# Patient Record
Sex: Female | Born: 1935 | Race: White | Hispanic: No | Marital: Married | Smoking: Former smoker
Health system: Southern US, Community
[De-identification: ages and names within clinical notes are randomized; demographics above are authoritative.]

## PROBLEM LIST (undated history)

## (undated) DIAGNOSIS — M1990 Unspecified osteoarthritis, unspecified site: Secondary | ICD-10-CM

## (undated) DIAGNOSIS — M62838 Other muscle spasm: Secondary | ICD-10-CM

## (undated) DIAGNOSIS — K219 Gastro-esophageal reflux disease without esophagitis: Secondary | ICD-10-CM

## (undated) DIAGNOSIS — M779 Enthesopathy, unspecified: Secondary | ICD-10-CM

## (undated) HISTORY — DX: Enthesopathy, unspecified: M77.9

## (undated) HISTORY — PX: REPLACEMENT TOTAL KNEE: SUR1224

## (undated) HISTORY — PX: BREAST BIOPSY: SHX20

## (undated) HISTORY — PX: BUNIONECTOMY: SHX129

## (undated) HISTORY — PX: OOPHORECTOMY: SHX86

## (undated) HISTORY — PX: FOOT SURGERY: SHX648

## (undated) HISTORY — DX: Other muscle spasm: M62.838

---
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
## 1976-12-24 HISTORY — PX: ABDOMINAL HYSTERECTOMY: SHX81

## 2003-07-23 DIAGNOSIS — M810 Age-related osteoporosis without current pathological fracture: Secondary | ICD-10-CM | POA: Insufficient documentation

## 2004-11-02 ENCOUNTER — Other Ambulatory Visit: Payer: Self-pay

## 2004-11-08 ENCOUNTER — Inpatient Hospital Stay: Payer: Self-pay | Admitting: Specialist

## 2005-01-16 ENCOUNTER — Ambulatory Visit: Payer: Self-pay | Admitting: Family Medicine

## 2005-11-23 ENCOUNTER — Ambulatory Visit: Payer: Self-pay | Admitting: Podiatry

## 2005-12-06 ENCOUNTER — Ambulatory Visit: Payer: Self-pay | Admitting: Podiatry

## 2006-03-14 ENCOUNTER — Ambulatory Visit: Payer: Self-pay | Admitting: Family Medicine

## 2007-01-03 ENCOUNTER — Ambulatory Visit: Payer: Self-pay | Admitting: Family Medicine

## 2007-02-05 ENCOUNTER — Ambulatory Visit: Payer: Self-pay | Admitting: Anesthesiology

## 2007-02-17 ENCOUNTER — Ambulatory Visit: Payer: Self-pay | Admitting: Anesthesiology

## 2007-03-04 ENCOUNTER — Ambulatory Visit: Payer: Self-pay | Admitting: Anesthesiology

## 2007-03-31 ENCOUNTER — Ambulatory Visit: Payer: Self-pay | Admitting: Anesthesiology

## 2007-05-14 ENCOUNTER — Ambulatory Visit: Payer: Self-pay | Admitting: Anesthesiology

## 2007-05-22 ENCOUNTER — Ambulatory Visit: Payer: Self-pay | Admitting: Family Medicine

## 2007-05-29 ENCOUNTER — Ambulatory Visit: Payer: Self-pay | Admitting: Anesthesiology

## 2008-06-03 ENCOUNTER — Ambulatory Visit: Payer: Self-pay | Admitting: Family Medicine

## 2008-06-21 ENCOUNTER — Ambulatory Visit: Payer: Self-pay | Admitting: Gastroenterology

## 2009-04-05 ENCOUNTER — Ambulatory Visit: Payer: Self-pay | Admitting: Podiatry

## 2009-05-20 DIAGNOSIS — M1990 Unspecified osteoarthritis, unspecified site: Secondary | ICD-10-CM | POA: Insufficient documentation

## 2009-07-21 DIAGNOSIS — M48061 Spinal stenosis, lumbar region without neurogenic claudication: Secondary | ICD-10-CM | POA: Insufficient documentation

## 2009-07-21 DIAGNOSIS — Z90710 Acquired absence of both cervix and uterus: Secondary | ICD-10-CM | POA: Insufficient documentation

## 2009-07-21 DIAGNOSIS — M9979 Connective tissue and disc stenosis of intervertebral foramina of abdomen and other regions: Secondary | ICD-10-CM | POA: Insufficient documentation

## 2009-08-10 ENCOUNTER — Ambulatory Visit: Payer: Self-pay | Admitting: Family Medicine

## 2009-09-08 DIAGNOSIS — E7800 Pure hypercholesterolemia, unspecified: Secondary | ICD-10-CM | POA: Insufficient documentation

## 2011-01-24 ENCOUNTER — Ambulatory Visit: Payer: Self-pay | Admitting: Family Medicine

## 2012-03-07 ENCOUNTER — Ambulatory Visit: Payer: Self-pay | Admitting: Family Medicine

## 2012-08-22 ENCOUNTER — Ambulatory Visit: Payer: Self-pay | Admitting: Family Medicine

## 2012-08-28 ENCOUNTER — Ambulatory Visit: Payer: Self-pay | Admitting: General Surgery

## 2012-08-28 ENCOUNTER — Other Ambulatory Visit: Payer: Self-pay | Admitting: General Surgery

## 2012-08-28 LAB — CBC WITH DIFFERENTIAL/PLATELET
Basophil %: 0.4 %
Eosinophil #: 0.1 10*3/uL (ref 0.0–0.7)
Eosinophil %: 0.4 %
Lymphocyte #: 1 10*3/uL (ref 1.0–3.6)
MCV: 93 fL (ref 80–100)
Monocyte #: 0.9 x10 3/mm (ref 0.2–0.9)
Monocyte %: 7.8 %
Neutrophil #: 9.7 10*3/uL — ABNORMAL HIGH (ref 1.4–6.5)
Platelet: 261 10*3/uL (ref 150–440)
RBC: 3.76 10*6/uL — ABNORMAL LOW (ref 3.80–5.20)
WBC: 11.7 10*3/uL — ABNORMAL HIGH (ref 3.6–11.0)

## 2012-08-28 LAB — SEDIMENTATION RATE: Erythrocyte Sed Rate: 29 mm/hr (ref 0–30)

## 2012-08-28 LAB — CREATININE, SERUM: Creatinine: 0.83 mg/dL (ref 0.60–1.30)

## 2012-08-29 ENCOUNTER — Other Ambulatory Visit: Payer: Self-pay | Admitting: Family Medicine

## 2012-08-29 LAB — CBC WITH DIFFERENTIAL/PLATELET
Basophil %: 0.5 %
Eosinophil #: 0.1 10*3/uL (ref 0.0–0.7)
HCT: 35 % (ref 35.0–47.0)
Lymphocyte #: 2.1 10*3/uL (ref 1.0–3.6)
Lymphocyte %: 20.9 %
MCH: 31.6 pg (ref 26.0–34.0)
MCHC: 34.4 g/dL (ref 32.0–36.0)
MCV: 92 fL (ref 80–100)
Monocyte %: 10 %
Neutrophil #: 6.7 10*3/uL — ABNORMAL HIGH (ref 1.4–6.5)
Platelet: 272 10*3/uL (ref 150–440)
RBC: 3.8 10*6/uL (ref 3.80–5.20)
WBC: 9.9 10*3/uL (ref 3.6–11.0)

## 2012-09-01 ENCOUNTER — Ambulatory Visit: Payer: Self-pay | Admitting: Family Medicine

## 2013-03-18 ENCOUNTER — Ambulatory Visit: Payer: Self-pay | Admitting: Family Medicine

## 2013-03-20 ENCOUNTER — Ambulatory Visit: Payer: Self-pay | Admitting: Family Medicine

## 2013-08-12 ENCOUNTER — Encounter: Payer: Self-pay | Admitting: *Deleted

## 2013-09-02 ENCOUNTER — Ambulatory Visit (INDEPENDENT_AMBULATORY_CARE_PROVIDER_SITE_OTHER): Payer: Medicare Other | Admitting: General Surgery

## 2013-09-02 ENCOUNTER — Encounter: Payer: Self-pay | Admitting: General Surgery

## 2013-09-02 ENCOUNTER — Other Ambulatory Visit: Payer: Medicare Other

## 2013-09-02 VITALS — BP 140/76 | HR 74 | Resp 12 | Ht 63.0 in | Wt 147.0 lb

## 2013-09-02 DIAGNOSIS — N644 Mastodynia: Secondary | ICD-10-CM

## 2013-09-02 NOTE — Patient Instructions (Addendum)
Patient to have an left breast mammogram. Patient to use heat twice daily.   Patient to have a unilateral left breast diagnostic mammogram at [facility] on 09-22-13 at 8 am. She is aware of date, time, and instructions.

## 2013-09-02 NOTE — Progress Notes (Signed)
Patient ID: Jacqueline Daniels, female   DOB: August 16, 1936, 77 y.o.   MRN: [id number]  Chief Complaint  Patient presents with  . Other    left breast pain    HPI Jacqueline Daniels is a 77 y.o. female here today for evaluation of left breast pain. She states its been going on for about six months off and on pain. Last mammogram was in March 2014. Patient states she does not feel any lumps just sore.  The patient is very active, and plays golf at least 3 times a week. She had recently gone to [place] to help move her mother into an assisted living facility and close down the house. She had been doing a great deal of activity during this time, as well as on her return home in managing her large flour and vegetable garden. She's not 100% sure that there is a direct correlation between her discomfort and vigorous activity. She has appreciated a focal area of tenderness in the lateral aspect of the left breast. Frequently she symptom free except with direct pressure to this area. The patient was last seen in September 2013 when she was having pain underneath the right arm and in the right flank. Subsequent evaluation at that time included a CT about the abdomen as well as a dedicated CT for pulmonary embolus which was negative. HPI  Past Medical History  Diagnosis Date  . Muscle spasm     Past Surgical History  Procedure Laterality Date  . Appendectomy  1945  . Replacement total knee    . Foot surgery Bilateral   . Abdominal hysterectomy  1978    Family History  Problem Relation Age of Onset  . Breast cancer Mother     Social History History  Substance Use Topics  . Smoking status: Former Smoker -- 1.00 packs/day for 5 years  . Smokeless tobacco: Never Used  . Alcohol Use: No    No Known Allergies  Current Outpatient Prescriptions  Medication Sig Dispense Refill  . fish oil-omega-3 fatty acids 1000 MG capsule Take 2 g by mouth daily.      . meloxicam (MOBIC) 15 MG tablet Take 15 mg  by mouth daily.      . Multiple Vitamins-Minerals (MULTIVITAMIN WITH MINERALS) tablet Take 1 tablet by mouth daily.      Marland Kitchen omeprazole (PRILOSEC) 20 MG capsule Take 20 mg by mouth daily.       No current facility-administered medications for this visit.    Review of Systems Review of Systems  Constitutional: Negative.   Respiratory: Negative.   Cardiovascular: Negative.     Blood pressure 140/76, pulse 74, resp. rate 12, height 5\' 3"  (1.6 m), weight 147 lb (66.679 kg). The patient's weight is down 2 pounds from her September 2013 exam. Physical Exam Physical Exam  Constitutional: She is oriented to person, place, and time. She appears well-developed and well-nourished.  Cardiovascular: Normal rate, regular rhythm and normal heart sounds.   Pulmonary/Chest: Breath sounds normal. Right breast exhibits no inverted nipple, no mass, no nipple discharge, no skin change and no tenderness. Left breast exhibits no inverted nipple, no mass, no nipple discharge, no skin change and no tenderness.  Lymphadenopathy:    She has no cervical adenopathy.    She has no axillary adenopathy.  Neurological: She is alert and oriented to person, place, and time.  Skin: Skin is warm and dry.    Data Reviewed PCP notes. Laboratory studies dated 08/10/2013 showed normal CBC,  slightly elevated creatinine at 1.0 with an estimated GFR 56, normal electrolytes, normal liver function studies, normal TSH.  Bilateral mammograms dated 03/20/2013 were reviewed. Heterogeneously dense tissue appreciated. No definable mass or microcalcifications. BI-RAD-2.  Ultrasound examination of the upper outer quadrant of the left breast in the area of the patient's tendon showed a focal asymmetry at the 2:00 position 8 cm from the nipple. 2 adjacent slightly lobulated hypoechoic areas measuring up to 0.7 cm were identified. These were adjacent to the pectoralis fascia. One of the 2 areas was thought to represent a small lymph node.  No increased vascularity appreciated on duplex imaging.    Assessment     Mastalgia.    Plan     Patient is presently making use of anti-inflammatory. She's been encouraged to make use local heat for comfort. We'll arrange for a repeat mammogram to confirm no interval change.    Patient to have a unilateral left breast diagnostic mammogram at [facility] on 09-22-13 at 8 am. She is aware of date, time, and instructions. (Please note: this date is to accommodate patient's upcoming trip to [place]. She will be leaving this Friday and not returning until the end of the month.)  This patient will follow up in the office in one month.   Earline Mayotte 09/03/2013, 8:23 AM

## 2013-09-03 ENCOUNTER — Encounter: Payer: Self-pay | Admitting: General Surgery

## 2013-10-01 ENCOUNTER — Encounter: Payer: Self-pay | Admitting: General Surgery

## 2013-10-01 ENCOUNTER — Ambulatory Visit: Payer: Self-pay | Admitting: General Surgery

## 2013-10-06 ENCOUNTER — Encounter: Payer: Self-pay | Admitting: General Surgery

## 2013-10-06 ENCOUNTER — Ambulatory Visit (INDEPENDENT_AMBULATORY_CARE_PROVIDER_SITE_OTHER): Payer: Medicare Other | Admitting: General Surgery

## 2013-10-06 VITALS — BP 110/64 | HR 76 | Resp 12 | Ht 63.0 in | Wt 142.0 lb

## 2013-10-06 DIAGNOSIS — N644 Mastodynia: Secondary | ICD-10-CM

## 2013-10-06 NOTE — Patient Instructions (Addendum)
Patient to return as needed. If breast pain gets worse patient to call our office.

## 2013-10-06 NOTE — Progress Notes (Signed)
Patient ID: Jacqueline Daniels, female   DOB: 09/17/1936, 77 y.o.   MRN: [id number]  Chief Complaint  Patient presents with  . Other    mastalgia    HPI Jacqueline Daniels is a 77 y.o. female who presents for a breast evaluation. The most recent left breast mammogram was done on 09/22/13. Patient does perform regular self breast checks and gets regular mammograms done. She still has some discomfort but has improved since her last visit.     HPI  Past Medical History  Diagnosis Date  . Muscle spasm     Past Surgical History  Procedure Laterality Date  . Appendectomy  1945  . Replacement total knee    . Foot surgery Bilateral   . Abdominal hysterectomy  1978    Family History  Problem Relation Age of Onset  . Breast cancer Mother     Social History History  Substance Use Topics  . Smoking status: Former Smoker -- 1.00 packs/day for 5 years  . Smokeless tobacco: Never Used  . Alcohol Use: No    No Known Allergies  Current Outpatient Prescriptions  Medication Sig Dispense Refill  . fish oil-omega-3 fatty acids 1000 MG capsule Take 2 g by mouth daily.      . meloxicam (MOBIC) 15 MG tablet Take 15 mg by mouth daily.      . Multiple Vitamins-Minerals (MULTIVITAMIN WITH MINERALS) tablet Take 1 tablet by mouth daily.      Marland Kitchen omeprazole (PRILOSEC) 20 MG capsule Take 20 mg by mouth daily.       No current facility-administered medications for this visit.    Review of Systems Review of Systems  Constitutional: Negative.   Respiratory: Negative.   Cardiovascular: Negative.     Blood pressure 110/64, pulse 76, resp. rate 12, height 5\' 3"  (1.6 m), weight 142 lb (64.411 kg).  Physical Exam Physical Exam  Constitutional: She is oriented to person, place, and time. She appears well-developed and well-nourished.  Neck: No thyromegaly present.  Cardiovascular: Normal rate, regular rhythm and normal heart sounds.   No murmur heard. Pulmonary/Chest: Effort normal and breath sounds  normal. Right breast exhibits no inverted nipple, no mass, no nipple discharge, no skin change and no tenderness. Left breast exhibits no inverted nipple, no mass, no nipple discharge, no skin change and no tenderness.    Left breast thickening at 2 o'clock.  Lymphadenopathy:    She has no cervical adenopathy.    She has no axillary adenopathy.  Neurological: She is alert and oriented to person, place, and time.  Skin: Skin is warm and dry.    Data Reviewed Left breast mammogram dated 10/01/2013 was reviewed. BI-RAD-1.  Assessment    Benign breast exam, resolving mastalgia    Plan    The patient has shown significant improvement since her last exam. There is a slight area of focal thickening which may represent inflamed adipose tissue. Options for management at this time include: 1) ongoing observation versus 2) vacuum biopsy. Pros and cons of each were reviewed. At this time the patient is comfortable with observation. She'll resume annual screening mammograms in spring 2015 with her primary care provider. She was encouraged to call should she appreciate any change in the breast exam, tenderness or worry.       Earline Mayotte 10/07/2013, 7:05 AM

## 2014-05-19 ENCOUNTER — Ambulatory Visit: Payer: Self-pay | Admitting: Unknown Physician Specialty

## 2014-05-21 LAB — PATHOLOGY REPORT

## 2014-06-07 DIAGNOSIS — K589 Irritable bowel syndrome without diarrhea: Secondary | ICD-10-CM | POA: Insufficient documentation

## 2014-06-07 DIAGNOSIS — K449 Diaphragmatic hernia without obstruction or gangrene: Secondary | ICD-10-CM | POA: Insufficient documentation

## 2014-10-25 ENCOUNTER — Encounter: Payer: Self-pay | Admitting: General Surgery

## 2014-10-25 ENCOUNTER — Ambulatory Visit: Payer: Self-pay | Admitting: Family Medicine

## 2015-03-28 LAB — LIPID PANEL
Cholesterol: 266 mg/dL — AB (ref 0–200)
HDL: 86 mg/dL — AB (ref 35–70)
LDL CALC: 162 mg/dL
LDl/HDL Ratio: 1.9
Triglycerides: 92 mg/dL (ref 40–160)

## 2015-03-28 LAB — CBC AND DIFFERENTIAL
HCT: 39 % (ref 36–46)
Hemoglobin: 13.3 g/dL (ref 12.0–16.0)
Neutrophils Absolute: 5 /uL
PLATELETS: 293 10*3/uL (ref 150–399)
WBC: 7.9 10*3/mL

## 2015-03-28 LAB — HEPATIC FUNCTION PANEL
ALK PHOS: 93 U/L (ref 25–125)
ALT: 16 U/L (ref 7–35)
AST: 24 U/L (ref 13–35)
BILIRUBIN, TOTAL: 0.3 mg/dL

## 2015-03-28 LAB — BASIC METABOLIC PANEL
BUN: 19 mg/dL (ref 4–21)
CREATININE: 0.9 mg/dL (ref 0.5–1.1)
GLUCOSE: 98 mg/dL
Potassium: 4.4 mmol/L (ref 3.4–5.3)
Sodium: 144 mmol/L (ref 137–147)

## 2015-03-28 LAB — TSH: TSH: 1.51 u[IU]/mL (ref 0.41–5.90)

## 2015-04-19 ENCOUNTER — Other Ambulatory Visit: Payer: Self-pay | Admitting: Specialist

## 2015-04-19 DIAGNOSIS — M25512 Pain in left shoulder: Secondary | ICD-10-CM

## 2015-04-28 ENCOUNTER — Ambulatory Visit
Admission: RE | Admit: 2015-04-28 | Discharge: 2015-04-28 | Disposition: A | Discharge status: Home / Self Care | Payer: Medicare Other | Source: Ambulatory Visit | Attending: Specialist | Admitting: Specialist

## 2015-04-28 DIAGNOSIS — M25512 Pain in left shoulder: Secondary | ICD-10-CM

## 2015-04-28 DIAGNOSIS — X58XXXA Exposure to other specified factors, initial encounter: Secondary | ICD-10-CM | POA: Diagnosis not present

## 2015-04-28 DIAGNOSIS — S46912A Strain of unspecified muscle, fascia and tendon at shoulder and upper arm level, left arm, initial encounter: Secondary | ICD-10-CM | POA: Insufficient documentation

## 2015-05-26 ENCOUNTER — Other Ambulatory Visit: Payer: Self-pay | Admitting: Family Medicine

## 2015-09-22 ENCOUNTER — Ambulatory Visit (INDEPENDENT_AMBULATORY_CARE_PROVIDER_SITE_OTHER): Payer: Medicare Other

## 2015-09-22 DIAGNOSIS — Z23 Encounter for immunization: Secondary | ICD-10-CM

## 2015-11-05 ENCOUNTER — Other Ambulatory Visit: Payer: Self-pay | Admitting: Family Medicine

## 2015-11-05 DIAGNOSIS — K219 Gastro-esophageal reflux disease without esophagitis: Secondary | ICD-10-CM

## 2015-11-07 DIAGNOSIS — K219 Gastro-esophageal reflux disease without esophagitis: Secondary | ICD-10-CM | POA: Insufficient documentation

## 2015-11-07 NOTE — Telephone Encounter (Signed)
Last ov 03/16/15

## 2016-01-25 DIAGNOSIS — M9901 Segmental and somatic dysfunction of cervical region: Secondary | ICD-10-CM | POA: Diagnosis not present

## 2016-01-25 DIAGNOSIS — M9903 Segmental and somatic dysfunction of lumbar region: Secondary | ICD-10-CM | POA: Diagnosis not present

## 2016-01-25 DIAGNOSIS — M62830 Muscle spasm of back: Secondary | ICD-10-CM | POA: Diagnosis not present

## 2016-01-25 DIAGNOSIS — M955 Acquired deformity of pelvis: Secondary | ICD-10-CM | POA: Diagnosis not present

## 2016-01-25 DIAGNOSIS — M5136 Other intervertebral disc degeneration, lumbar region: Secondary | ICD-10-CM | POA: Diagnosis not present

## 2016-01-25 DIAGNOSIS — M9905 Segmental and somatic dysfunction of pelvic region: Secondary | ICD-10-CM | POA: Diagnosis not present

## 2016-02-01 DIAGNOSIS — Z6825 Body mass index (BMI) 25.0-25.9, adult: Secondary | ICD-10-CM | POA: Insufficient documentation

## 2016-02-01 DIAGNOSIS — G43009 Migraine without aura, not intractable, without status migrainosus: Secondary | ICD-10-CM | POA: Insufficient documentation

## 2016-02-01 DIAGNOSIS — M9905 Segmental and somatic dysfunction of pelvic region: Secondary | ICD-10-CM | POA: Diagnosis not present

## 2016-02-01 DIAGNOSIS — K649 Unspecified hemorrhoids: Secondary | ICD-10-CM | POA: Insufficient documentation

## 2016-02-01 DIAGNOSIS — K219 Gastro-esophageal reflux disease without esophagitis: Secondary | ICD-10-CM | POA: Insufficient documentation

## 2016-02-01 DIAGNOSIS — F419 Anxiety disorder, unspecified: Secondary | ICD-10-CM | POA: Insufficient documentation

## 2016-02-01 DIAGNOSIS — D649 Anemia, unspecified: Secondary | ICD-10-CM | POA: Insufficient documentation

## 2016-02-01 DIAGNOSIS — M9903 Segmental and somatic dysfunction of lumbar region: Secondary | ICD-10-CM | POA: Diagnosis not present

## 2016-02-01 DIAGNOSIS — M5136 Other intervertebral disc degeneration, lumbar region: Secondary | ICD-10-CM | POA: Diagnosis not present

## 2016-02-01 DIAGNOSIS — R030 Elevated blood-pressure reading, without diagnosis of hypertension: Secondary | ICD-10-CM | POA: Insufficient documentation

## 2016-02-01 DIAGNOSIS — M62830 Muscle spasm of back: Secondary | ICD-10-CM | POA: Diagnosis not present

## 2016-02-01 DIAGNOSIS — M9901 Segmental and somatic dysfunction of cervical region: Secondary | ICD-10-CM | POA: Diagnosis not present

## 2016-02-01 DIAGNOSIS — F418 Other specified anxiety disorders: Secondary | ICD-10-CM | POA: Insufficient documentation

## 2016-02-01 DIAGNOSIS — M955 Acquired deformity of pelvis: Secondary | ICD-10-CM | POA: Diagnosis not present

## 2016-02-01 DIAGNOSIS — L989 Disorder of the skin and subcutaneous tissue, unspecified: Secondary | ICD-10-CM | POA: Insufficient documentation

## 2016-02-01 DIAGNOSIS — K909 Intestinal malabsorption, unspecified: Secondary | ICD-10-CM | POA: Insufficient documentation

## 2016-02-08 DIAGNOSIS — M5136 Other intervertebral disc degeneration, lumbar region: Secondary | ICD-10-CM | POA: Diagnosis not present

## 2016-02-08 DIAGNOSIS — M955 Acquired deformity of pelvis: Secondary | ICD-10-CM | POA: Diagnosis not present

## 2016-02-08 DIAGNOSIS — M62830 Muscle spasm of back: Secondary | ICD-10-CM | POA: Diagnosis not present

## 2016-02-08 DIAGNOSIS — M9903 Segmental and somatic dysfunction of lumbar region: Secondary | ICD-10-CM | POA: Diagnosis not present

## 2016-02-08 DIAGNOSIS — M9905 Segmental and somatic dysfunction of pelvic region: Secondary | ICD-10-CM | POA: Diagnosis not present

## 2016-02-08 DIAGNOSIS — M9901 Segmental and somatic dysfunction of cervical region: Secondary | ICD-10-CM | POA: Diagnosis not present

## 2016-02-27 ENCOUNTER — Encounter: Payer: Self-pay | Admitting: Family Medicine

## 2016-02-27 ENCOUNTER — Ambulatory Visit (INDEPENDENT_AMBULATORY_CARE_PROVIDER_SITE_OTHER): Payer: Medicare Other | Admitting: Family Medicine

## 2016-02-27 VITALS — BP 150/86 | HR 72 | Temp 98.2°F | Resp 16 | Ht 63.0 in | Wt 148.0 lb

## 2016-02-27 DIAGNOSIS — E7800 Pure hypercholesterolemia, unspecified: Secondary | ICD-10-CM

## 2016-02-27 DIAGNOSIS — M542 Cervicalgia: Secondary | ICD-10-CM

## 2016-02-27 DIAGNOSIS — R030 Elevated blood-pressure reading, without diagnosis of hypertension: Secondary | ICD-10-CM

## 2016-02-27 DIAGNOSIS — Z0000 Encounter for general adult medical examination without abnormal findings: Secondary | ICD-10-CM | POA: Diagnosis not present

## 2016-02-27 DIAGNOSIS — Z1239 Encounter for other screening for malignant neoplasm of breast: Secondary | ICD-10-CM

## 2016-02-27 NOTE — Progress Notes (Signed)
Patient ID: Jacqueline Daniels, female   DOB: 01-09-1936, 80 y.o.   MRN: [id number]       Patient: Jacqueline Daniels, Female    DOB: 1936-03-20, 80 y.o.   MRN: [id number] Visit Date: 02/27/2016  Today's Provider: Lorie Phenix, MD   Chief Complaint  Patient presents with  . Medicare Wellness   Subjective:    Annual wellness visit Jacqueline Daniels is a 80 y.o. female. She feels well. She reports exercising none. She reports she is sleeping poorly, due to neck pain. 03/16/15 CPE 11/17/14 Mammogram-BI-RADS 1 05/19/14 Colonoscopy-polyp, diverticulosis, internal hemorrhoids, no repeat 03/24/14 BMD-normal  Lab Results  Component Value Date   WBC 7.9 03/28/2015   HGB 13.3 03/28/2015   HCT 39 03/28/2015   PLT 293 03/28/2015   CHOL 266* 03/28/2015   TRIG 92 03/28/2015   HDL 86* 03/28/2015   LDLCALC 162 03/28/2015   ALT 16 03/28/2015   AST 24 03/28/2015   NA 144 03/28/2015   K 4.4 03/28/2015   CREATININE 0.9 03/28/2015   BUN 19 03/28/2015   TSH 1.51 03/28/2015   -----------------------------------------------------------  Neck pain: Patient reports that neck pain is  Worsening, only at night. Patient has been seen chiropractic. Patient reports that she takes Tylenol pm only after not sleeping well for 3 nights. Patient reports that her neck pain does not bother her at all during the day or when being active.   Review of Systems  Constitutional: Negative.   HENT: Negative.   Eyes: Negative.   Respiratory: Negative.   Cardiovascular: Negative.   Gastrointestinal: Negative.   Endocrine: Negative.   Genitourinary: Negative.   Musculoskeletal: Positive for back pain, arthralgias and neck pain.  Skin: Negative.   Allergic/Immunologic: Negative.   Neurological: Negative.   Hematological: Negative.   Psychiatric/Behavioral: Negative.     Social History   Social History  . Marital Status: Married    Spouse Name: N/A  . Number of Children: N/A  . Years of Education: N/A    Occupational History  . Not on file.   Social History Main Topics  . Smoking status: Former Smoker -- 1.00 packs/day for 5 years  . Smokeless tobacco: Never Used  . Alcohol Use: No  . Drug Use: No  . Sexual Activity: Not on file   Other Topics Concern  . Not on file   Social History Narrative    Past Medical History  Diagnosis Date  . Muscle spasm      Patient Active Problem List   Diagnosis Date Noted  . Absolute anemia 02/01/2016  . Anxiety 02/01/2016  . Atypical migraine 02/01/2016  . Body mass index (BMI) of 25.0-25.9 in adult 02/01/2016  . Blood pressure elevated without history of HTN 02/01/2016  . Hemorrhoid 02/01/2016  . Intestinal malabsorption 02/01/2016  . Gastro-esophageal reflux disease without esophagitis 02/01/2016  . Skin lesion 02/01/2016  . Gastroesophageal reflux disease 11/07/2015  . 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 Breast pain, left 09/02/2013  . Hypercholesteremia 09/08/2009  . H/O: hysterectomy 07/21/2009  . Narrowing of intervertebral disc space 07/21/2009  . Lumbar canal stenosis 07/21/2009  . Arthropathia 05/20/2009  . OP (osteoporosis) 07/23/2003    Past Surgical History  Procedure Laterality Date  . Appendectomy  1945  . Replacement total knee    . Foot surgery Bilateral   . Bunionectomy    . Abdominal hysterectomy  1978    and oophorectomy    Her family history includes Breast  cancer in her mother; Heart attack in her father; Prostate cancer in her father.    Previous Medications   MELOXICAM (MOBIC) 15 MG TABLET    1 TABLET, ORAL, 1 EVERY DAY   MULTIPLE VITAMINS-MINERALS (MULTIVITAMIN WITH MINERALS) TABLET    Take 1 tablet by mouth daily.   OMEPRAZOLE (PRILOSEC) 20 MG CAPSULE    TAKE ONE CAPSULE BY MOUTH EVERY DAY    Patient Care Team: Lorie Phenix, MD as PCP - General (Family Medicine) Earline Mayotte, MD (General Surgery) Lorie Phenix, MD as Referring Physician (Family Medicine)      Objective:   Vitals: BP 150/86 mmHg  Pulse 72  Temp(Src) 98.2 F (36.8 C) (Oral)  Resp 16  Ht  (1.6 m)  Wt 148 lb (67.132 kg)  BMI 26.22 kg/m2  Physical Exam  Constitutional: She is oriented to person, place, and time. She appears well-developed and well-nourished.  HENT:  Head: Normocephalic and atraumatic.  Right Ear: Tympanic membrane, external ear and ear canal normal.  Left Ear: Tympanic membrane, external ear and ear canal normal.  Nose: Nose normal.  Mouth/Throat: Uvula is midline, oropharynx is clear and moist and mucous membranes are normal.  Eyes: Conjunctivae, EOM and lids are normal. Pupils are equal, round, and reactive to light.  Neck: Trachea normal and normal range of motion. Neck supple. Carotid bruit is not present. No thyroid mass and no thyromegaly present.  Cardiovascular: Normal rate, regular rhythm and normal heart sounds.   Pulmonary/Chest: Effort normal and breath sounds normal.  Abdominal: Soft. Normal appearance and bowel sounds are normal. There is no hepatosplenomegaly. There is no tenderness.  Musculoskeletal: Normal range of motion.  Lymphadenopathy:    She has no cervical adenopathy.    She has no axillary adenopathy.  Neurological: She is alert and oriented to person, place, and time. She has normal strength. No cranial nerve deficit.  Skin: Skin is warm, dry and intact.  Psychiatric: She has a normal mood and affect. Her speech is normal and behavior is normal. Judgment and thought content normal. Cognition and memory are normal.    Activities of Daily Living In your present state of health, do you have any difficulty performing the following activities: 02/27/2016  Hearing? N  Vision? N  Difficulty concentrating or making decisions? N  Walking or climbing stairs? N  Dressing or bathing? N  Doing errands, shopping? N    Fall Risk Assessment Fall Risk  02/27/2016  Falls in the past year? No     Depression Screen PHQ 2/9 Scores  02/27/2016  PHQ - 2 Score 0    Cognitive Testing - 6-CIT  Correct? Score   What year is it? yes 0 0 or 4  What month is it? yes 0 0 or 3  Memorize:    Jacqueline Daniels,  44,  [address]  [place],      What time is it? (within 1 hour) yes 0 0 or 3  Count backwards from 20 yes 0 0, 2, or 4  Name the months of the year yes 1 0, 2, or 4  Repeat name & address above yes 2 0, 2, 4, 6, 8, or 10       TOTAL SCORE  3/28   Interpretation:  Normal  Normal (0-7) Abnormal (8-28)       Assessment & Plan:     Annual Wellness Visit  Reviewed patient's Family Medical History Reviewed and updated list of patient's medical providers Assessment of  cognitive impairment was done Assessed patient's functional ability Established a written schedule for health screening services Health Risk Assessment Completed and Reviewed  Exercise Activities and Dietary recommendations Goals    None      Immunization History  Administered Date(s) Administered  . Influenza, High Dose Seasonal PF 09/22/2015  . Pneumococcal Conjugate-13 03/12/2014  . Pneumococcal Polysaccharide-23 10/25/2004  . Tdap 10/24/2004, 09/12/2011  . Zoster 09/18/2008         1. Medicare annual wellness visit, subsequent Stable. Patient advised to continue eating healthy and exercise daily.  2. Breast cancer screening - MM DIGITAL SCREENING BILATERAL; Future  3. Blood pressure elevated without history of HTN - CBC with Differential/Platelet - Comprehensive metabolic panel  4. Hypercholesteremia - Lipid Panel With LDL/HDL Ratio  5. Neck pain Worsening. Patient referred to Dr. Yves Dillhasnis for evaluation and follow-up. - AMB referral to orthopedics     Patient seen and examined by Dr. Leo GrosserNancy J.. Jayliani Wanner, and note scribed by Liz BeachSulibeya S. Dimas, CMA. I have reviewed the document for accuracy and completeness and I agree with above. Leo Grosser- Azelia Reiger J. Dorlene Footman, MD   Lorie PhenixNancy Tanijah Morais, MD    ------------------------------------------------------------------------------------------------------------

## 2016-02-29 DIAGNOSIS — M9905 Segmental and somatic dysfunction of pelvic region: Secondary | ICD-10-CM | POA: Diagnosis not present

## 2016-02-29 DIAGNOSIS — M9903 Segmental and somatic dysfunction of lumbar region: Secondary | ICD-10-CM | POA: Diagnosis not present

## 2016-02-29 DIAGNOSIS — M955 Acquired deformity of pelvis: Secondary | ICD-10-CM | POA: Diagnosis not present

## 2016-02-29 DIAGNOSIS — M62830 Muscle spasm of back: Secondary | ICD-10-CM | POA: Diagnosis not present

## 2016-02-29 DIAGNOSIS — R030 Elevated blood-pressure reading, without diagnosis of hypertension: Secondary | ICD-10-CM | POA: Diagnosis not present

## 2016-02-29 DIAGNOSIS — M9901 Segmental and somatic dysfunction of cervical region: Secondary | ICD-10-CM | POA: Diagnosis not present

## 2016-02-29 DIAGNOSIS — M5136 Other intervertebral disc degeneration, lumbar region: Secondary | ICD-10-CM | POA: Diagnosis not present

## 2016-02-29 DIAGNOSIS — E7800 Pure hypercholesterolemia, unspecified: Secondary | ICD-10-CM | POA: Diagnosis not present

## 2016-03-01 ENCOUNTER — Telehealth: Payer: Self-pay | Admitting: Family Medicine

## 2016-03-01 LAB — CBC WITH DIFFERENTIAL/PLATELET
BASOS ABS: 0 10*3/uL (ref 0.0–0.2)
Basos: 1 %
EOS (ABSOLUTE): 0.2 10*3/uL (ref 0.0–0.4)
EOS: 5 %
HEMATOCRIT: 38.6 % (ref 34.0–46.6)
HEMOGLOBIN: 13 g/dL (ref 11.1–15.9)
IMMATURE GRANULOCYTES: 0 %
Immature Grans (Abs): 0 10*3/uL (ref 0.0–0.1)
Lymphocytes Absolute: 1.7 10*3/uL (ref 0.7–3.1)
Lymphs: 33 %
MCH: 30.7 pg (ref 26.6–33.0)
MCHC: 33.7 g/dL (ref 31.5–35.7)
MCV: 91 fL (ref 79–97)
MONOCYTES: 8 %
Monocytes Absolute: 0.4 10*3/uL (ref 0.1–0.9)
NEUTROS PCT: 53 %
Neutrophils Absolute: 2.7 10*3/uL (ref 1.4–7.0)
PLATELETS: 279 10*3/uL (ref 150–379)
RBC: 4.23 x10E6/uL (ref 3.77–5.28)
RDW: 13.2 % (ref 12.3–15.4)
WBC: 5.1 10*3/uL (ref 3.4–10.8)

## 2016-03-01 LAB — COMPREHENSIVE METABOLIC PANEL
ALK PHOS: 96 IU/L (ref 39–117)
ALT: 14 IU/L (ref 0–32)
AST: 23 IU/L (ref 0–40)
Albumin/Globulin Ratio: 2 (ref 1.1–2.5)
Albumin: 4.3 g/dL (ref 3.5–4.8)
BILIRUBIN TOTAL: 0.3 mg/dL (ref 0.0–1.2)
BUN/Creatinine Ratio: 22 (ref 11–26)
BUN: 17 mg/dL (ref 8–27)
CHLORIDE: 101 mmol/L (ref 96–106)
CO2: 26 mmol/L (ref 18–29)
CREATININE: 0.79 mg/dL (ref 0.57–1.00)
Calcium: 9.7 mg/dL (ref 8.7–10.3)
GFR calc Af Amer: 84 mL/min/{1.73_m2} (ref >59)
GFR calc non Af Amer: 72 mL/min/{1.73_m2} (ref >59)
GLUCOSE: 85 mg/dL (ref 65–99)
Globulin, Total: 2.2 g/dL (ref 1.5–4.5)
Potassium: 4.4 mmol/L (ref 3.5–5.2)
Sodium: 141 mmol/L (ref 134–144)
Total Protein: 6.5 g/dL (ref 6.0–8.5)

## 2016-03-01 LAB — LIPID PANEL WITH LDL/HDL RATIO
CHOLESTEROL TOTAL: 226 mg/dL — AB (ref 100–199)
HDL: 79 mg/dL (ref >39)
LDL CALC: 134 mg/dL — AB (ref 0–99)
LDl/HDL Ratio: 1.7 ratio units (ref 0.0–3.2)
TRIGLYCERIDES: 64 mg/dL (ref 0–149)
VLDL CHOLESTEROL CAL: 13 mg/dL (ref 5–40)

## 2016-03-01 NOTE — Telephone Encounter (Signed)
Pt returning call.  [phone]/[id number]/MW

## 2016-03-02 ENCOUNTER — Ambulatory Visit
Admission: RE | Admit: 2016-03-02 | Discharge: 2016-03-02 | Disposition: A | Discharge status: Home / Self Care | Payer: Medicare Other | Source: Ambulatory Visit | Attending: Family Medicine | Admitting: Family Medicine

## 2016-03-02 DIAGNOSIS — Z1231 Encounter for screening mammogram for malignant neoplasm of breast: Secondary | ICD-10-CM | POA: Insufficient documentation

## 2016-03-02 DIAGNOSIS — Z1239 Encounter for other screening for malignant neoplasm of breast: Secondary | ICD-10-CM

## 2016-03-02 NOTE — Telephone Encounter (Signed)
See lab result note-aa 

## 2016-03-14 DIAGNOSIS — M9903 Segmental and somatic dysfunction of lumbar region: Secondary | ICD-10-CM | POA: Diagnosis not present

## 2016-03-14 DIAGNOSIS — M9905 Segmental and somatic dysfunction of pelvic region: Secondary | ICD-10-CM | POA: Diagnosis not present

## 2016-03-14 DIAGNOSIS — M955 Acquired deformity of pelvis: Secondary | ICD-10-CM | POA: Diagnosis not present

## 2016-03-14 DIAGNOSIS — M9901 Segmental and somatic dysfunction of cervical region: Secondary | ICD-10-CM | POA: Diagnosis not present

## 2016-03-14 DIAGNOSIS — M5136 Other intervertebral disc degeneration, lumbar region: Secondary | ICD-10-CM | POA: Diagnosis not present

## 2016-03-14 DIAGNOSIS — M62830 Muscle spasm of back: Secondary | ICD-10-CM | POA: Diagnosis not present

## 2016-03-27 DIAGNOSIS — M5136 Other intervertebral disc degeneration, lumbar region: Secondary | ICD-10-CM | POA: Diagnosis not present

## 2016-03-27 DIAGNOSIS — M5412 Radiculopathy, cervical region: Secondary | ICD-10-CM | POA: Diagnosis not present

## 2016-03-27 DIAGNOSIS — M5030 Other cervical disc degeneration, unspecified cervical region: Secondary | ICD-10-CM | POA: Diagnosis not present

## 2016-03-27 DIAGNOSIS — M5416 Radiculopathy, lumbar region: Secondary | ICD-10-CM | POA: Diagnosis not present

## 2016-03-28 DIAGNOSIS — M955 Acquired deformity of pelvis: Secondary | ICD-10-CM | POA: Diagnosis not present

## 2016-03-28 DIAGNOSIS — M9905 Segmental and somatic dysfunction of pelvic region: Secondary | ICD-10-CM | POA: Diagnosis not present

## 2016-03-28 DIAGNOSIS — M62830 Muscle spasm of back: Secondary | ICD-10-CM | POA: Diagnosis not present

## 2016-03-28 DIAGNOSIS — H524 Presbyopia: Secondary | ICD-10-CM | POA: Diagnosis not present

## 2016-03-28 DIAGNOSIS — M5136 Other intervertebral disc degeneration, lumbar region: Secondary | ICD-10-CM | POA: Diagnosis not present

## 2016-03-28 DIAGNOSIS — M9903 Segmental and somatic dysfunction of lumbar region: Secondary | ICD-10-CM | POA: Diagnosis not present

## 2016-03-28 DIAGNOSIS — M9901 Segmental and somatic dysfunction of cervical region: Secondary | ICD-10-CM | POA: Diagnosis not present

## 2016-04-20 DIAGNOSIS — M9903 Segmental and somatic dysfunction of lumbar region: Secondary | ICD-10-CM | POA: Diagnosis not present

## 2016-04-20 DIAGNOSIS — M955 Acquired deformity of pelvis: Secondary | ICD-10-CM | POA: Diagnosis not present

## 2016-04-20 DIAGNOSIS — M5136 Other intervertebral disc degeneration, lumbar region: Secondary | ICD-10-CM | POA: Diagnosis not present

## 2016-04-20 DIAGNOSIS — M62830 Muscle spasm of back: Secondary | ICD-10-CM | POA: Diagnosis not present

## 2016-04-20 DIAGNOSIS — M9905 Segmental and somatic dysfunction of pelvic region: Secondary | ICD-10-CM | POA: Diagnosis not present

## 2016-04-20 DIAGNOSIS — M9901 Segmental and somatic dysfunction of cervical region: Secondary | ICD-10-CM | POA: Diagnosis not present

## 2016-04-23 ENCOUNTER — Emergency Department
Admission: EM | Admit: 2016-04-23 | Discharge: 2016-04-23 | Disposition: A | Discharge status: Home / Self Care | Payer: Medicare Other | Attending: Emergency Medicine | Admitting: Emergency Medicine

## 2016-04-23 ENCOUNTER — Emergency Department: Payer: Medicare Other

## 2016-04-23 ENCOUNTER — Telehealth: Payer: Self-pay | Admitting: Family Medicine

## 2016-04-23 DIAGNOSIS — M810 Age-related osteoporosis without current pathological fracture: Secondary | ICD-10-CM | POA: Diagnosis not present

## 2016-04-23 DIAGNOSIS — Z79899 Other long term (current) drug therapy: Secondary | ICD-10-CM | POA: Insufficient documentation

## 2016-04-23 DIAGNOSIS — M545 Low back pain, unspecified: Secondary | ICD-10-CM | POA: Insufficient documentation

## 2016-04-23 DIAGNOSIS — Z87891 Personal history of nicotine dependence: Secondary | ICD-10-CM | POA: Insufficient documentation

## 2016-04-23 LAB — URINALYSIS COMPLETE WITH MICROSCOPIC (ARMC ONLY)
BILIRUBIN URINE: NEGATIVE
Bacteria, UA: NONE SEEN
GLUCOSE, UA: NEGATIVE mg/dL
Hgb urine dipstick: NEGATIVE
Ketones, ur: NEGATIVE mg/dL
Leukocytes, UA: NEGATIVE
NITRITE: NEGATIVE
Protein, ur: 30 mg/dL — AB
Specific Gravity, Urine: 1.023 (ref 1.005–1.030)
Squamous Epithelial / LPF: NONE SEEN
pH: 5 (ref 5.0–8.0)

## 2016-04-23 MED ORDER — HYDROCODONE-ACETAMINOPHEN 5-325 MG PO TABS: 1.0000 | ORAL_TABLET | Freq: Three times a day (TID) | ORAL | Status: DC | PRN

## 2016-04-23 MED ORDER — HYDROCODONE-ACETAMINOPHEN 5-325 MG PO TABS
1.0000 | ORAL_TABLET | Freq: Once | ORAL | Status: AC
  Administered 2016-04-23: 1 via ORAL
  Filled 2016-04-23: qty 1

## 2016-04-23 NOTE — Telephone Encounter (Signed)
Signed, please notify patient. Thanks.

## 2016-04-23 NOTE — Telephone Encounter (Signed)
Pt was seen in the ED this morning for back pain. Declined pain medication in the ED for the pain, due to having medication at home. Was unaware it was her husband's medications. Is requesting prescription for Hydrocodone-APAP 5-325. Has FU appointment scheduled for Wednesday at 10:30. Verbal okay from Dr. Elease HashimotoMaloney to print rx. Is on desk to sign. Allene DillonEmily Drozdowski, CMA

## 2016-04-23 NOTE — Discharge Instructions (Signed)
1. You may take your pain medicine as needed. 2. Apply moist heat to affected area several times daily. 3. Return to the ER for worsening symptoms, persistent vomiting, fever, leg weakness, bowel or bladder incontinence, or other concerns.  Back Pain, Adult Back pain is very common in adults.The cause of back pain is rarely dangerous and the pain often gets better over time.The cause of your back pain may not be known. Some common causes of back pain include:  Strain of the muscles or ligaments supporting the spine.  Wear and tear (degeneration) of the spinal disks.  Arthritis.  Direct injury to the back. For many people, back pain may return. Since back pain is rarely dangerous, most people can learn to manage this condition on their own. HOME CARE INSTRUCTIONS Watch your back pain for any changes. The following actions may help to lessen any discomfort you are feeling:  Remain active. It is stressful on your back to sit or stand in one place for long periods of time. Do not sit, drive, or stand in one place for more than 30 minutes at a time. Take short walks on even surfaces as soon as you are able.Try to increase the length of time you walk each day.  Exercise regularly as directed by your health care provider. Exercise helps your back heal faster. It also helps avoid future injury by keeping your muscles strong and flexible.  Do not stay in bed.Resting more than 1-2 days can delay your recovery.  Pay attention to your body when you bend and lift. The most comfortable positions are those that put less stress on your recovering back. Always use proper lifting techniques, including:  Bending your knees.  Keeping the load close to your body.  Avoiding twisting.  Find a comfortable position to sleep. Use a firm mattress and lie on your side with your knees slightly bent. If you lie on your back, put a pillow under your knees.  Avoid feeling anxious or stressed.Stress increases  muscle tension and can worsen back pain.It is important to recognize when you are anxious or stressed and learn ways to manage it, such as with exercise.  Take medicines only as directed by your health care provider. Over-the-counter medicines to reduce pain and inflammation are often the most helpful.Your health care provider may prescribe muscle relaxant drugs.These medicines help dull your pain so you can more quickly return to your normal activities and healthy exercise.  Apply ice to the injured area:  Put ice in a plastic bag.  Place a towel between your skin and the bag.  Leave the ice on for 20 minutes, 2-3 times a day for the first 2-3 days. After that, ice and heat may be alternated to reduce pain and spasms.  Maintain a healthy weight. Excess weight puts extra stress on your back and makes it difficult to maintain good posture. SEEK MEDICAL CARE IF:  You have pain that is not relieved with rest or medicine.  You have increasing pain going down into the legs or buttocks.  You have pain that does not improve in one week.  You have night pain.  You lose weight.  You have a fever or chills. SEEK IMMEDIATE MEDICAL CARE IF:   You develop new bowel or bladder control problems.  You have unusual weakness or numbness in your arms or legs.  You develop nausea or vomiting.  You develop abdominal pain.  You feel faint.   This information is not intended to  replace advice given to you by your health care provider. Make sure you discuss any questions you have with your health care provider.   Document Released: 12/10/2005 Document Revised: 12/31/2014 Document Reviewed: 04/13/2014 Elsevier Interactive Patient Education [employer].

## 2016-04-23 NOTE — ED Notes (Signed)
Patient reports back pain.  Reports back pain for approximately 1 year that she has been receiving treatment for.  Tonight while getting out of bed pain more severe.

## 2016-04-23 NOTE — ED Provider Notes (Signed)
[facility] Emergency Department Provider Note   ____________________________________________  Time seen: Approximately 5:52 AM  I have reviewed the triage vital signs and the nursing notes.   HISTORY  Chief Complaint Back Pain    HPI Jacqueline Daniels is a 80 y.o. female who presents to the ED from home with a chief complain of low back pain. Patient reports low back pain for the past year for which she has been seeing a Land. Reports gardening 3 days ago with repetitive bending motion. Increase in lower back pain since. Tonight she got out of bed to use the restroom and the pain was so intense she could barely sit on the toilet. Patient has Norco at home but has an upcoming chiropractic appointment and has not been taking her Norco because she did not want to "mask her pain".Reports sharp, nonradiating lower back pain which is not associated with numbness/tingling, leg weakness or bowel/bladder incontinence. Denies recent fever, chills, chest pain, shortness of breath, abdominal pain, nausea, vomiting, diarrhea. Reports urinary frequency. Denies recent travel or trauma. Nothing makes her pain better. Movement makes her pain worse.   Past Medical History  Diagnosis Date  . Muscle spasm     Patient Active Problem List   Diagnosis Date Noted  . Absolute anemia 02/01/2016  . Anxiety 02/01/2016  . Atypical migraine 02/01/2016  . Body mass index (BMI) of 25.0-25.9 in adult 02/01/2016  . Blood pressure elevated without history of HTN 02/01/2016  . Hemorrhoid 02/01/2016  . Intestinal malabsorption 02/01/2016  . Gastro-esophageal reflux disease without esophagitis 02/01/2016  . Skin lesion 02/01/2016  . Gastroesophageal reflux disease 11/07/2015  . 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 Breast pain, left 09/02/2013  . Hypercholesteremia 09/08/2009  . H/O: hysterectomy 07/21/2009  . Narrowing of intervertebral disc space  07/21/2009  . Lumbar canal stenosis 07/21/2009  . Arthropathia 05/20/2009  . OP (osteoporosis) 07/23/2003    Past Surgical History  Procedure Laterality Date  . Appendectomy  1945  . Replacement total knee    . Foot surgery Bilateral   . Bunionectomy    . Abdominal hysterectomy  1978    and oophorectomy    Current Outpatient Rx  Name  Route  Sig  Dispense  Refill  . meloxicam (MOBIC) 15 MG tablet      1 TABLET, ORAL, 1 EVERY DAY   90 tablet   2   . Multiple Vitamins-Minerals (MULTIVITAMIN WITH MINERALS) tablet   Oral   Take 1 tablet by mouth daily.         Marland Kitchen omeprazole (PRILOSEC) 20 MG capsule      TAKE ONE CAPSULE BY MOUTH EVERY DAY   90 capsule   2     Allergies Review of patient's allergies indicates no active allergies.  Family History  Problem Relation Age of Onset  . Breast cancer Mother   . Prostate cancer Father   . Heart attack Father     Social History Social History  Substance Use Topics  . Smoking status: Former Smoker -- 1.00 packs/day for 5 years  . Smokeless tobacco: Never Used  . Alcohol Use: No    Review of Systems  Constitutional: No fever/chills. Eyes: No visual changes. ENT: No sore throat. Cardiovascular: Denies chest pain. Respiratory: Denies shortness of breath. Gastrointestinal: No abdominal pain.  No nausea, no vomiting.  No diarrhea.  No constipation. Genitourinary: Negative for dysuria. Musculoskeletal: Positive for back pain. Skin: Negative for rash. Neurological:  Negative for headaches, focal weakness or numbness.  10-point ROS otherwise negative.  ____________________________________________   PHYSICAL EXAM:  VITAL SIGNS: ED Triage Vitals  Enc Vitals Group     BP 04/23/16 0355 164/97 mmHg     Pulse Rate 04/23/16 0355 83     Resp 04/23/16 0355 22     Temp 04/23/16 0355 97.8 F (36.6 C)     Temp Source 04/23/16 0355 Oral     SpO2 04/23/16 0355 98 %     Weight 04/23/16 0355 142 lb (64.411 kg)     Height  04/23/16 0355 5\' 3"  (1.6 m)     Head Cir --      Peak Flow --      Pain Score 04/23/16 0356 10     Pain Loc --      Pain Edu? --      Excl. in GC? --     Constitutional: Alert and oriented. Well appearing and in no acute distress. Eyes: Conjunctivae are normal. PERRL. EOMI. Head: Atraumatic. Nose: No congestion/rhinnorhea. Mouth/Throat: Mucous membranes are moist.  Oropharynx non-erythematous. Neck: No stridor.  No cervical spine tenderness to palpation. Cardiovascular: Normal rate, regular rhythm. Grossly normal heart sounds.  Good peripheral circulation. Respiratory: Normal respiratory effort.  No retractions. Lungs CTAB. Gastrointestinal: Soft and nontender. No distention. No abdominal bruits. No CVA tenderness. Musculoskeletal: Midline lumbar spinal tenderness to palpation with lumbar paraspinal muscle tightness. Negative straight leg raise bilaterally. No lower extremity tenderness nor edema.  No joint effusions. Neurologic:  Normal speech and language. No gross focal neurologic deficits are appreciated.  Skin:  Skin is warm, dry and intact. No rash noted. Psychiatric: Mood and affect are normal. Speech and behavior are normal.  ____________________________________________   LABS (all labs ordered are listed, but only abnormal results are displayed)  Labs Reviewed  URINALYSIS COMPLETEWITH MICROSCOPIC ([facility] ONLY)   ____________________________________________  EKG  None ____________________________________________  RADIOLOGY  Lumbar spine x-rays (reviewed by me, interpreted per Dr. Sunday Spillersalessio): No acute abnormality.  Advanced multilevel spondylosis.  Scoliosis. ____________________________________________   PROCEDURES  Procedure(s) performed: None  Critical Care performed: No  ____________________________________________   INITIAL IMPRESSION / ASSESSMENT AND PLAN / ED COURSE  Pertinent labs & imaging results that were available during my care of the  patient were reviewed by me and considered in my medical decision making (see chart for details).  80 year old female with chronic lower back pain issues with exacerbation after gardening 3 days ago. Patient received a Norco while awaiting treatment room and is currently pain-free. She desires x-ray imaging studies; will also obtain urinalysis given patient's urinary frequency.  ----------------------------------------- 7:23 AM on 04/23/2016 -----------------------------------------  Updated patient of x-ray and urinalysis results. Again she has Norco at home to help with her back pain. She has a chiropractor appointment next week. I have also advised her to follow-up with her PCP this week. Strict return precautions given. Patient verbalizes understanding and agrees with plan of care. ____________________________________________   FINAL CLINICAL IMPRESSION(S) / ED DIAGNOSES  Final diagnoses:  Midline low back pain without sciatica      NEW MEDICATIONS STARTED DURING THIS VISIT:  New Prescriptions   No medications on file     Note:  This document was prepared using Dragon voice recognition software and may include unintentional dictation errors.    Irean HongJade J Sung, MD 04/23/16 [phone]

## 2016-04-24 NOTE — Telephone Encounter (Signed)
Placed up front. Allene DillonEmily Drozdowski, CMA

## 2016-04-25 ENCOUNTER — Encounter: Payer: Self-pay | Admitting: Family Medicine

## 2016-04-25 ENCOUNTER — Ambulatory Visit (INDEPENDENT_AMBULATORY_CARE_PROVIDER_SITE_OTHER): Payer: Medicare Other | Admitting: Family Medicine

## 2016-04-25 VITALS — BP 142/70 | HR 76 | Temp 98.4°F | Resp 16 | Wt 141.0 lb

## 2016-04-25 DIAGNOSIS — M545 Low back pain: Secondary | ICD-10-CM

## 2016-04-25 MED ORDER — MELOXICAM 15 MG PO TABS: 15.0000 mg | ORAL_TABLET | Freq: Every day | ORAL | Status: DC

## 2016-04-25 NOTE — Progress Notes (Signed)
Subjective:    Patient ID: Jacqueline Daniels, female    DOB: [birth year], 80 y.o.   MRN: [id number]  HPI   Follow up ER visit  Patient was seen in ER for back pain  on 04/23/2016.  She was treated for midline low back pain without sciatica. Treatment for this included Norco. She reports good compliance with treatment. She reports this condition is Unchanged due to spasms.  ------------------------------------------------------------------------------------    Review of Systems  Constitutional: Positive for activity change (due to back pain). Negative for fever, chills, diaphoresis, appetite change, fatigue and unexpected weight change.  Musculoskeletal: Positive for back pain.   BP 142/70 mmHg  Pulse 76  Temp(Src) 98.4 F (36.9 C) (Oral)  Resp 16  Wt 141 lb (63.957 kg)   Patient Active Problem List   Diagnosis Date Noted  . Low back pain 04/23/2016  . Absolute anemia 02/01/2016  . Anxiety 02/01/2016  . Atypical migraine 02/01/2016  . Body mass index (BMI) of 25.0-25.9 in adult 02/01/2016  . Blood pressure elevated without history of HTN 02/01/2016  . Hemorrhoid 02/01/2016  . Intestinal malabsorption 02/01/2016  . Gastro-esophageal reflux disease without esophagitis 02/01/2016  . Skin lesion 02/01/2016  . Gastroesophageal reflux disease 11/07/2015  . 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 Breast pain, left 09/02/2013  . Hypercholesteremia 09/08/2009  . H/O: hysterectomy 07/21/2009  . Narrowing of intervertebral disc space 07/21/2009  . Lumbar canal stenosis 07/21/2009  . Arthropathia 05/20/2009  . OP (osteoporosis) 07/23/2003   Past Medical History  Diagnosis Date  . Muscle spasm    Current Outpatient Prescriptions on File Prior to Visit  Medication Sig  . Multiple Vitamins-Minerals (MULTIVITAMIN WITH MINERALS) tablet Take 1 tablet by mouth daily.  Marland Kitchen. omeprazole (PRILOSEC) 20 MG capsule TAKE ONE CAPSULE BY MOUTH EVERY DAY  .  HYDROcodone-acetaminophen (NORCO/VICODIN) 5-325 MG tablet Take 1 tablet by mouth every 8 (eight) hours as needed for moderate pain. (Patient not taking: Reported on 04/25/2016)  . meloxicam (MOBIC) 15 MG tablet 1 TABLET, ORAL, 1 EVERY DAY (Patient not taking: Reported on 04/25/2016)   No current facility-administered medications on file prior to visit.   No Known Allergies Past Surgical History  Procedure Laterality Date  . Appendectomy  1945  . Replacement total knee    . Foot surgery Bilateral   . Bunionectomy    . Abdominal hysterectomy  1978    and oophorectomy   Social History   Social History  . Marital Status: Married    Spouse Name: N/A  . Number of Children: N/A  . Years of Education: N/A   Occupational History  . Not on file.   Social History Main Topics  . Smoking status: Former Smoker -- 1.00 packs/day for 5 years  . Smokeless tobacco: Never Used  . Alcohol Use: No  . Drug Use: No  . Sexual Activity: Not on file   Other Topics Concern  . Not on file   Social History Narrative   Family History  Problem Relation Age of Onset  . Breast cancer Mother   . Prostate cancer Father   . Heart attack Father        Objective:   Physical Exam  Constitutional: She is oriented to person, place, and time. She appears well-developed and well-nourished.  Neurological: She is alert and oriented to person, place, and time.  Psychiatric: She has a normal mood and affect. Her behavior is normal. Judgment and thought content normal.  BP 142/70 mmHg  Pulse 76  Temp(Src) 98.4 F (36.9 C) (Oral)  Resp 16  Wt 141 lb (63.957 kg)      Assessment & Plan:  1. Low back pain, unspecified back pain laterality, with sciatica presence unspecified Will follow up with Dr. Yves Dill as scheduled.  Picked up prescription for Hydrocodone today.   Also, sent off rx for Meloxicam.    Patient seen and examined by Leo Grosser, MD, and note scribed by Allene Dillon, CMA.  I have  reviewed the document for accuracy and completeness and I agree with above. Leo Grosser, MD   Lorie Phenix, MD

## 2016-04-27 DIAGNOSIS — M5416 Radiculopathy, lumbar region: Secondary | ICD-10-CM | POA: Diagnosis not present

## 2016-04-27 DIAGNOSIS — M5136 Other intervertebral disc degeneration, lumbar region: Secondary | ICD-10-CM | POA: Diagnosis not present

## 2016-05-07 ENCOUNTER — Other Ambulatory Visit: Payer: Self-pay | Admitting: Family Medicine

## 2016-05-07 DIAGNOSIS — K219 Gastro-esophageal reflux disease without esophagitis: Secondary | ICD-10-CM

## 2016-08-13 ENCOUNTER — Other Ambulatory Visit: Payer: Self-pay

## 2016-08-13 DIAGNOSIS — K219 Gastro-esophageal reflux disease without esophagitis: Secondary | ICD-10-CM

## 2016-08-13 MED ORDER — OMEPRAZOLE 20 MG PO CPDR: 20.0000 mg | DELAYED_RELEASE_CAPSULE | Freq: Every day | ORAL | 1 refills | Status: DC

## 2016-08-13 NOTE — Telephone Encounter (Signed)
Request Medication: Omeprazole 20 MG.   Patient was Dr. Santiago BurMaloney's.  Thanks,  -Joseline

## 2016-10-02 ENCOUNTER — Ambulatory Visit (INDEPENDENT_AMBULATORY_CARE_PROVIDER_SITE_OTHER): Payer: Medicare Other | Admitting: Physician Assistant

## 2016-10-02 DIAGNOSIS — Z23 Encounter for immunization: Secondary | ICD-10-CM

## 2016-10-19 ENCOUNTER — Other Ambulatory Visit: Payer: Self-pay | Admitting: Family Medicine

## 2016-10-19 DIAGNOSIS — M545 Low back pain, unspecified: Secondary | ICD-10-CM

## 2016-10-19 NOTE — Telephone Encounter (Signed)
Last ov 04/25/16. 

## 2016-10-23 NOTE — Telephone Encounter (Signed)
Patient needs to be seen in office for follow up back pain. Not clear from Dr. Santiago BurMaloney's note what she wanted to do. Not willing to refill again without seeing patient.

## 2016-10-23 NOTE — Telephone Encounter (Signed)
Patient advised as below. Patient will keep appointment for 03/06/17

## 2016-11-01 DIAGNOSIS — M5136 Other intervertebral disc degeneration, lumbar region: Secondary | ICD-10-CM | POA: Diagnosis not present

## 2016-11-01 DIAGNOSIS — M5416 Radiculopathy, lumbar region: Secondary | ICD-10-CM | POA: Diagnosis not present

## 2017-01-17 DIAGNOSIS — M5416 Radiculopathy, lumbar region: Secondary | ICD-10-CM | POA: Diagnosis not present

## 2017-01-17 DIAGNOSIS — M5136 Other intervertebral disc degeneration, lumbar region: Secondary | ICD-10-CM | POA: Diagnosis not present

## 2017-01-23 ENCOUNTER — Other Ambulatory Visit: Payer: Self-pay | Admitting: Family Medicine

## 2017-01-23 DIAGNOSIS — M545 Low back pain, unspecified: Secondary | ICD-10-CM

## 2017-01-24 ENCOUNTER — Other Ambulatory Visit: Payer: Self-pay

## 2017-01-24 NOTE — Telephone Encounter (Signed)
Opened in error

## 2017-02-04 ENCOUNTER — Other Ambulatory Visit: Payer: Self-pay | Admitting: Physician Assistant

## 2017-02-04 DIAGNOSIS — K219 Gastro-esophageal reflux disease without esophagitis: Secondary | ICD-10-CM

## 2017-02-04 MED ORDER — OMEPRAZOLE 20 MG PO CPDR: 20.0000 mg | DELAYED_RELEASE_CAPSULE | Freq: Every day | ORAL | 1 refills | Status: DC

## 2017-02-04 MED ORDER — MELOXICAM 15 MG PO TABS: 15.0000 mg | ORAL_TABLET | Freq: Every day | ORAL | 1 refills | Status: DC

## 2017-02-04 NOTE — Telephone Encounter (Signed)
Please Review

## 2017-02-04 NOTE — Telephone Encounter (Signed)
Pt needs refill no all of her medications, she states she is out of medication.  Her next appt is in March for her physical and she is requesting a refill be sent in until then.  She states she doesn't want to come to the office in fear she will get the flu.

## 2017-02-14 ENCOUNTER — Ambulatory Visit
Admission: RE | Admit: 2017-02-14 | Discharge: 2017-02-14 | Disposition: A | Discharge status: Home / Self Care | Payer: Medicare Other | Source: Ambulatory Visit | Attending: Physician Assistant | Admitting: Physician Assistant

## 2017-02-14 ENCOUNTER — Ambulatory Visit (INDEPENDENT_AMBULATORY_CARE_PROVIDER_SITE_OTHER): Payer: Medicare Other | Admitting: Physician Assistant

## 2017-02-14 ENCOUNTER — Encounter: Payer: Self-pay | Admitting: Physician Assistant

## 2017-02-14 VITALS — BP 156/82 | HR 76 | Temp 98.4°F | Resp 16 | Wt 144.0 lb

## 2017-02-14 DIAGNOSIS — R918 Other nonspecific abnormal finding of lung field: Secondary | ICD-10-CM | POA: Insufficient documentation

## 2017-02-14 DIAGNOSIS — R0781 Pleurodynia: Secondary | ICD-10-CM

## 2017-02-14 DIAGNOSIS — R079 Chest pain, unspecified: Secondary | ICD-10-CM | POA: Diagnosis not present

## 2017-02-14 NOTE — Patient Instructions (Signed)
Chest Wall Pain °Introduction °Chest wall pain is pain in or around the bones and muscles of your chest. Sometimes, an injury causes this pain. Sometimes, the cause may not be known. This pain may take several weeks or longer to get better. °Follow these instructions at home: °Pay attention to any changes in your symptoms. Take these actions to help with your pain: °· Rest as told by your doctor. °· Avoid activities that cause pain. Try not to use your chest, belly (abdominal), or side muscles to lift heavy things. °· If directed, apply ice to the painful area: °¨ Put ice in a plastic bag. °¨ Place a towel between your skin and the bag. °¨ Leave the ice on for 20 minutes, 2-3 times per day. °· Take over-the-counter and prescription medicines only as told by your doctor. °· Do not use tobacco products, including cigarettes, chewing tobacco, and e-cigarettes. If you need help quitting, ask your doctor. °· Keep all follow-up visits as told by your doctor. This is important. °Contact a doctor if: °· You have a fever. °· Your chest pain gets worse. °· You have new symptoms. °Get help right away if: °· You feel sick to your stomach (nauseous) or you throw up (vomit). °· You feel sweaty or light-headed. °· You have a cough with phlegm (sputum) or you cough up blood. °· You are short of breath. °This information is not intended to replace advice given to you by your health care provider. Make sure you discuss any questions you have with your health care provider. °Document Released: 05/28/2008 Document Revised: 05/17/2016 Document Reviewed: 03/07/2015 °© 2017 Elsevier ° °

## 2017-02-14 NOTE — Progress Notes (Signed)
Patient: Jacqueline Daniels Female    DOB: 1936-09-12   81 y.o.   MRN: [id number] Visit Date: 02/14/2017  Today's Provider: Trey SailorsAdriana M Pollak, PA-C   No chief complaint on file.  Subjective:    HPI Patient comes in today complaining of pain under left breast/Rib pain ongoing for a couple of weeks, worsening yesterday. She describes it as more of an aggravation than a pain. She locates it under the inferior border of her left 12th rib. She says it is sharp and stabbing. No rash that she's noticed. Occurred three times over the past 24 hours. She denies trauma, cough, chest pain, SOB, nausea, vomiting, change in bowels or urinary habits. She still manages to be active playing golf three times a week, gardening, and walking three miles on the days she's not playing golf.   No Known Allergies   Current Outpatient Prescriptions:  .  meloxicam (MOBIC) 15 MG tablet, Take 1 tablet (15 mg total) by mouth daily., Disp: 90 tablet, Rfl: 1 .  Multiple Vitamins-Minerals (MULTIVITAMIN WITH MINERALS) tablet, Take 1 tablet by mouth daily., Disp: , Rfl:  .  omeprazole (PRILOSEC) 20 MG capsule, Take 1 capsule (20 mg total) by mouth daily., Disp: 90 capsule, Rfl: 1 .  HYDROcodone-acetaminophen (NORCO/VICODIN) 5-325 MG tablet, Take 1 tablet by mouth every 8 (eight) hours as needed for moderate pain. (Patient not taking: Reported on 02/14/2017), Disp: 30 tablet, Rfl: 0  Review of Systems  Constitutional: Negative.   Respiratory: Negative.   Gastrointestinal: Positive for constipation and diarrhea. Negative for abdominal distention, abdominal pain, anal bleeding, blood in stool, nausea, rectal pain and vomiting.       Pt reports these symptoms have improved.    Musculoskeletal: Positive for arthralgias (Chronic issues) and myalgias. Negative for back pain, gait problem, joint swelling, neck pain and neck stiffness.  Skin: Negative for rash.  Neurological: Negative for dizziness, light-headedness and  headaches.    Social History  Substance Use Topics  . Smoking status: Former Smoker    Packs/day: 1.00    Years: 5.00  . Smokeless tobacco: Never Used  . Alcohol use No   Objective:   BP (!) 156/82 (BP Location: Left Arm, Patient Position: Sitting, Cuff Size: Normal)   Pulse 76   Temp 98.4 F (36.9 C) (Oral)   Resp 16   Wt 144 lb (65.3 kg)   BMI 25.51 kg/m   Physical Exam  Constitutional: She is oriented to person, place, and time. She appears well-developed and well-nourished. No distress.  Cardiovascular: Normal rate and regular rhythm.   Pulmonary/Chest: Effort normal and breath sounds normal.    Abdominal: Soft. Bowel sounds are normal. She exhibits no distension. There is no tenderness. There is no rebound and no guarding.  Neurological: She is alert and oriented to person, place, and time.  Skin: Skin is warm and dry. No rash noted. No erythema.  Psychiatric: She has a normal mood and affect. Her behavior is normal.        Assessment & Plan:     1. Rib pain on left side  Not certain of etiology of this, possibly MSK spasm. Could potentially be prodromal for shingles outbreak. Will get CXR to evaluate ribs and lungs. No cardiology or respiratory risk factors.  - DG Chest 2 View; Future  Return if symptoms worsen or fail to improve.   Patient Instructions  Chest Wall Pain Introduction Chest wall pain is pain in or around the  bones and muscles of your chest. Sometimes, an injury causes this pain. Sometimes, the cause may not be known. This pain may take several weeks or longer to get better. Follow these instructions at home: Pay attention to any changes in your symptoms. Take these actions to help with your pain:  Rest as told by your doctor.  Avoid activities that cause pain. Try not to use your chest, belly (abdominal), or side muscles to lift heavy things.  If directed, apply ice to the painful area:  Put ice in a plastic bag.  Place a towel between  your skin and the bag.  Leave the ice on for 20 minutes, 2-3 times per day.  Take over-the-counter and prescription medicines only as told by your doctor.  Do not use tobacco products, including cigarettes, chewing tobacco, and e-cigarettes. If you need help quitting, ask your doctor.  Keep all follow-up visits as told by your doctor. This is important. Contact a doctor if:  You have a fever.  Your chest pain gets worse.  You have new symptoms. Get help right away if:  You feel sick to your stomach (nauseous) or you throw up (vomit).  You feel sweaty or light-headed.  You have a cough with phlegm (sputum) or you cough up blood.  You are short of breath. This information is not intended to replace advice given to you by your health care provider. Make sure you discuss any questions you have with your health care provider. Document Released: 05/28/2008 Document Revised: 05/17/2016 Document Reviewed: 03/07/2015  2017 Elsevier          Ricki Rodriguez Jac Canavan, PA-C  [facility]

## 2017-03-06 ENCOUNTER — Encounter: Payer: Medicare Other | Admitting: Physician Assistant

## 2017-03-11 ENCOUNTER — Ambulatory Visit (INDEPENDENT_AMBULATORY_CARE_PROVIDER_SITE_OTHER): Payer: Medicare Other | Admitting: Physician Assistant

## 2017-03-11 ENCOUNTER — Telehealth: Payer: Self-pay

## 2017-03-11 ENCOUNTER — Encounter: Payer: Self-pay | Admitting: Physician Assistant

## 2017-03-11 VITALS — BP 146/80 | HR 80 | Temp 97.9°F | Resp 16 | Wt 145.6 lb

## 2017-03-11 DIAGNOSIS — T402X5A Adverse effect of other opioids, initial encounter: Secondary | ICD-10-CM | POA: Diagnosis not present

## 2017-03-11 DIAGNOSIS — K644 Residual hemorrhoidal skin tags: Secondary | ICD-10-CM | POA: Diagnosis not present

## 2017-03-11 DIAGNOSIS — K5903 Drug induced constipation: Secondary | ICD-10-CM | POA: Diagnosis not present

## 2017-03-11 MED ORDER — HYDROCORTISONE ACETATE 25 MG RE SUPP: 25.0000 mg | Freq: Two times a day (BID) | RECTAL | 0 refills | Status: DC

## 2017-03-11 NOTE — Telephone Encounter (Signed)
Patient calling that she noticed a little blood in her underwear one of this days. She reports that she has a hemorrhoid.She went for a walk and she walked 3 miles and she doesn't know if that cause for her hemorrhoid to bleed more.She reports this morning when she wiped it was bright red.She is not having any blood in her stool, stool is not dark,SOB, fatigue or no tenderness around the area. She reports it hurts only when it gets touched.Patient got scheduled at 1:45 pm.  Thanks,  -Joseline

## 2017-03-11 NOTE — Progress Notes (Signed)
       Patient: Jacqueline ClossBonnie L Daniels Female    DOB: 04/12/1936   81 y.o.   MRN: [id number] Visit Date: 03/11/2017  Today's Provider: Margaretann LovelessJennifer M Burnette, PA-C   No chief complaint on file.  Subjective:    Patient here today C/O bleeding hemorrhoid  this morning. Patient reports she has been having some constipation due to taking pain medication for back pain and knee pain. Patient reports that hemorrhoid is painful to touch. She denies any pain with walking, sitting or at any other time than when she wipes.      No Known Allergies   Current Outpatient Prescriptions:  .  HYDROcodone-acetaminophen (NORCO/VICODIN) 5-325 MG tablet, Take 1 tablet by mouth every 8 (eight) hours as needed for moderate pain. (Patient not taking: Reported on 02/14/2017), Disp: 30 tablet, Rfl: 0 .  meloxicam (MOBIC) 15 MG tablet, Take 1 tablet (15 mg total) by mouth daily., Disp: 90 tablet, Rfl: 1 .  Multiple Vitamins-Minerals (MULTIVITAMIN WITH MINERALS) tablet, Take 1 tablet by mouth daily., Disp: , Rfl:  .  omeprazole (PRILOSEC) 20 MG capsule, Take 1 capsule (20 mg total) by mouth daily., Disp: 90 capsule, Rfl: 1  Review of Systems  Constitutional: Negative.   Respiratory: Negative.   Cardiovascular: Negative.   Gastrointestinal: Positive for anal bleeding and constipation (when she takes aleve or Norco). Negative for abdominal pain, blood in stool, diarrhea, nausea, rectal pain and vomiting.    Social History  Substance Use Topics  . Smoking status: Former Smoker    Packs/day: 1.00    Years: 5.00  . Smokeless tobacco: Never Used  . Alcohol use No   Objective:   BP (!) 146/80 (BP Location: Left Arm, Patient Position: Sitting, Cuff Size: Large)   Pulse 80   Temp 97.9 F (36.6 C) (Oral)   Resp 16   Wt 145 lb 9.6 oz (66 kg)   BMI 25.79 kg/m  Vitals:   03/11/17 1345  BP: (!) 146/80  Pulse: 80  Resp: 16  Temp: 97.9 F (36.6 C)  TempSrc: Oral  Weight: 145 lb 9.6 oz (66 kg)     Physical Exam    Constitutional: She appears well-developed and well-nourished. No distress.  Cardiovascular: Normal rate, regular rhythm and normal heart sounds.  Exam reveals no gallop and no friction rub.   No murmur heard. Pulmonary/Chest: Effort normal and breath sounds normal. No respiratory distress. She has no wheezes. She has no rales.  Genitourinary: Rectal exam shows external hemorrhoid and tenderness. Rectal exam shows no fissure.    Pelvic exam was performed with patient in the knee-chest position.  Skin: She is not diaphoretic.  Vitals reviewed.      Assessment & Plan:     1. External hemorrhoid, bleeding Discussed thrombectomy of the thrombosed hemorrhoid in the office but patient wished to proceed with conservative management initially. Advised patient to use anusol-HC, epsom salt soaks (SITZ), and witch hazel. She voiced understanding and is to call if symptoms worsen or if she develops pain.  - hydrocortisone (ANUSOL-HC) 25 MG suppository; Place 1 suppository (25 mg total) rectally 2 (two) times daily.  Dispense: 12 suppository; Refill: 0  2. Therapeutic opioid induced constipation Discussed bowel health and to add stool softener, increase water intake. May add fiber supplement as well if she desires. She is to call if constipation continues to worsen.       Margaretann LovelessJennifer M Burnette, PA-C  [facility]

## 2017-03-11 NOTE — Patient Instructions (Signed)
Hemorrhoids Hemorrhoids are swollen veins in and around the rectum or anus. There are two types of hemorrhoids:  Internal hemorrhoids. These occur in the veins that are just inside the rectum. They may poke through to the outside and become irritated and painful.  External hemorrhoids. These occur in the veins that are outside of the anus and can be felt as a painful swelling or hard lump near the anus. Most hemorrhoids do not cause serious problems, and they can be managed with home treatments such as diet and lifestyle changes. If home treatments do not help your symptoms, procedures can be done to shrink or remove the hemorrhoids. What are the causes? This condition is caused by increased pressure in the anal area. This pressure may result from various things, including:  Constipation.  Straining to have a bowel movement.  Diarrhea.  Pregnancy.  Obesity.  Sitting for long periods of time.  Heavy lifting or other activity that causes you to strain.  Anal sex. What are the signs or symptoms? Symptoms of this condition include:  Pain.  Anal itching or irritation.  Rectal bleeding.  Leakage of stool (feces).  Anal swelling.  One or more lumps around the anus. How is this diagnosed? This condition can often be diagnosed through a visual exam. Other exams or tests may also be done, such as:  Examination of the rectal area with a gloved hand (digital rectal exam).  Examination of the anal canal using a small tube (anoscope).  A blood test, if you have lost a significant amount of blood.  A test to look inside the colon (sigmoidoscopy or colonoscopy). How is this treated? This condition can usually be treated at home. However, various procedures may be done if dietary changes, lifestyle changes, and other home treatments do not help your symptoms. These procedures can help make the hemorrhoids smaller or remove them completely. Some of these procedures involve surgery,  and others do not. Common procedures include:  Rubber band ligation. Rubber bands are placed at the base of the hemorrhoids to cut off the blood supply to them.  Sclerotherapy. Medicine is injected into the hemorrhoids to shrink them.  Infrared coagulation. A type of light energy is used to get rid of the hemorrhoids.  Hemorrhoidectomy surgery. The hemorrhoids are surgically removed, and the veins that supply them are tied off.  Stapled hemorrhoidopexy surgery. A circular stapling device is used to remove the hemorrhoids and use staples to cut off the blood supply to them. Follow these instructions at home: Eating and drinking   Eat foods that have a lot of fiber in them, such as whole grains, beans, nuts, fruits, and vegetables. Ask your health care provider about taking products that have added fiber (fiber supplements).  Drink enough fluid to keep your urine clear or pale yellow. Managing pain and swelling   Take warm sitz baths for 20 minutes, 3-4 times a day to ease pain and discomfort.  If directed, apply ice to the affected area. Using ice packs between sitz baths may be helpful.  Put ice in a plastic bag.  Place a towel between your skin and the bag.  Leave the ice on for 20 minutes, 2-3 times a day. General instructions   Take over-the-counter and prescription medicines only as told by your health care provider.  Use medicated creams or suppositories as told.  Exercise regularly.  Go to the bathroom when you have the urge to have a bowel movement. Do not wait.  Avoid  straining to have bowel movements.  Keep the anal area dry and clean. Use wet toilet paper or moist towelettes after a bowel movement.  Do not sit on the toilet for long periods of time. This increases blood pooling and pain. Contact a health care provider if:  You have increasing pain and swelling that are not controlled by treatment or medicine.  You have uncontrolled bleeding.  You have  difficulty having a bowel movement, or you are unable to have a bowel movement.  You have pain or inflammation outside the area of the hemorrhoids. This information is not intended to replace advice given to you by your health care provider. Make sure you discuss any questions you have with your health care provider. Document Released: 12/07/2000 Document Revised: 05/09/2016 Document Reviewed: 08/24/2015 Elsevier Interactive Patient Education  2017 [employer]  Hydrocortisone suppositories What is this medicine? HYDROCORTISONE (hye droe KOR ti sone) is a corticosteroid. It is used to decrease swelling, itching, and pain that is caused by minor skin irritations or by hemorrhoids. This medicine may be used for other purposes; ask your health care provider or pharmacist if you have questions. COMMON BRAND NAME(S): Anucort-HC, Anumed-HC, Anusol HC, Encort, GRx HiCort, Hemmorex-HC, Hemorrhoidal-HC, Hemril, Proctocort, Proctosert HC, Proctosol-HC, Rectacort HC, Rectasol-HC What should I tell my health care provider before I take this medicine? They need to know if you have any of these conditions: -an unusual or allergic reaction to hydrocortisone, corticosteroids, other medicines, foods, dyes, or preservatives -pregnant or trying to get pregnant -breast-feeding How should I use this medicine? This medicine is for rectal use only. Do not take by mouth. Wash your hands before and after use. Take off the foil wrapping. Wet the tip of the suppository with cold tap water to make it easier to use. Lie on your side with your lower leg straightened out and your upper leg bent forward toward your stomach. Lift upper buttock to expose the rectal area. Apply gentle pressure to insert the suppository completely into the rectum, pointed end first. Hold buttocks together for a few seconds. Remain lying down for about 15 minutes to avoid having the suppository come out. Do not use more often than directed. Talk to  your pediatrician regarding the use of this medicine in children. Special care may be needed. Overdosage: If you think you have taken too much of this medicine contact a poison control center or emergency room at once. NOTE: This medicine is only for you. Do not share this medicine with others. What if I miss a dose? If you miss a dose, use it as soon as you can. If it is almost time for your next dose, use only that dose. Do not use double or extra doses. What may interact with this medicine? Interactions are not expected. Do not use any other rectal products on the affected area without telling your doctor or health care professional. This list may not describe all possible interactions. Give your health care provider a list of all the medicines, herbs, non-prescription drugs, or dietary supplements you use. Also tell them if you smoke, drink alcohol, or use illegal drugs. Some items may interact with your medicine. What should I watch for while using this medicine? Visit your doctor or health care professional for regular checks on your progress. Tell your doctor or health care professional if your symptoms do not improve after a few days of use. Do not use if there is blood in your stools. If you get any  type of infection while using this medicine, you may need to stop using this medicine until our infections clears up. Ask your doctor or health care professional for advice. What side effects may I notice from receiving this medicine? Side effects that you should report to your doctor or health care professional as soon as possible: -bloody or black, tarry stools -painful, red, pus filled blisters in hair follicles -rectal pain, burning or bleeding after use of medicine Side effects that usually do not require medical attention (report to your doctor or health care professional if they continue or are bothersome): -changes in skin color -dry skin -itching or irritation This list may not  describe all possible side effects. Call your doctor for medical advice about side effects. You may report side effects to FDA at 1-800-FDA-1088. Where should I keep my medicine? Keep out of the reach of children. Store at room temperature between 20 and 25 degrees C (68 and 77 degrees F). Protect from heat and freezing. Throw away any unused medicine after the expiration date. NOTE: This sheet is a summary. It may not cover all possible information. If you have questions about this medicine, talk to your doctor, pharmacist, or health care provider.  2018 Elsevier/Gold Standard (2008-04-23 16:07:24)

## 2017-03-18 ENCOUNTER — Encounter: Payer: Self-pay | Admitting: Physician Assistant

## 2017-03-18 ENCOUNTER — Ambulatory Visit (INDEPENDENT_AMBULATORY_CARE_PROVIDER_SITE_OTHER): Payer: Medicare Other | Admitting: Physician Assistant

## 2017-03-18 VITALS — BP 140/80 | HR 72 | Temp 97.9°F | Resp 16 | Ht 63.0 in | Wt 146.2 lb

## 2017-03-18 DIAGNOSIS — D649 Anemia, unspecified: Secondary | ICD-10-CM | POA: Diagnosis not present

## 2017-03-18 DIAGNOSIS — K649 Unspecified hemorrhoids: Secondary | ICD-10-CM | POA: Diagnosis not present

## 2017-03-18 DIAGNOSIS — R030 Elevated blood-pressure reading, without diagnosis of hypertension: Secondary | ICD-10-CM | POA: Diagnosis not present

## 2017-03-18 DIAGNOSIS — Z803 Family history of malignant neoplasm of breast: Secondary | ICD-10-CM

## 2017-03-18 DIAGNOSIS — Z23 Encounter for immunization: Secondary | ICD-10-CM | POA: Diagnosis not present

## 2017-03-18 DIAGNOSIS — Z1239 Encounter for other screening for malignant neoplasm of breast: Secondary | ICD-10-CM

## 2017-03-18 DIAGNOSIS — Z1231 Encounter for screening mammogram for malignant neoplasm of breast: Secondary | ICD-10-CM

## 2017-03-18 DIAGNOSIS — E7800 Pure hypercholesterolemia, unspecified: Secondary | ICD-10-CM | POA: Diagnosis not present

## 2017-03-18 DIAGNOSIS — H6123 Impacted cerumen, bilateral: Secondary | ICD-10-CM | POA: Diagnosis not present

## 2017-03-18 DIAGNOSIS — Z0000 Encounter for general adult medical examination without abnormal findings: Secondary | ICD-10-CM

## 2017-03-18 NOTE — Progress Notes (Signed)
Patient: Jacqueline Daniels, Female    DOB: February 28, 1936, 81 y.o.   MRN: [id number] Visit Date: 03/18/2017  Today's Provider: Margaretann LovelessJennifer M Burnette, PA-C   Chief Complaint  Patient presents with  . Medicare Wellness   Subjective:    Annual wellness visit Jacqueline Daniels is a 81 y.o. female. She feels well. She reports exercising daily. She reports she is sleeping fairly well. 02/27/16 AWE 03/02/16 Mammogram-BI-RADS 1 05/19/14 Colonoscopy-polyp, diverticulosis, no repeat 04/02/14 BMD-Normal -----------------------------------------------------------   Review of Systems  Constitutional: Negative.   HENT: Negative.   Eyes: Positive for discharge and itching.  Respiratory: Negative.   Cardiovascular: Negative.   Gastrointestinal: Negative.   Endocrine: Negative.   Genitourinary: Negative.   Musculoskeletal: Positive for arthralgias and back pain.  Skin: Negative.   Allergic/Immunologic: Negative.   Neurological: Negative.   Hematological: Negative.   Psychiatric/Behavioral: Negative.     Social History   Social History  . Marital status: Married    Spouse name: N/A  . Number of children: N/A  . Years of education: 1   Occupational History  . Not on file.   Social History Main Topics  . Smoking status: Former Smoker    Packs/day: 1.00    Years: 5.00  . Smokeless tobacco: Never Used  . Alcohol use No  . Drug use: No  . Sexual activity: Not on file   Other Topics Concern  . Not on file   Social History Narrative  . No narrative on file    Past Medical History:  Diagnosis Date  . Muscle spasm      Patient Active Problem List   Diagnosis Date Noted  . Low back pain 04/23/2016  . Absolute anemia 02/01/2016  . Anxiety 02/01/2016  . Atypical migraine 02/01/2016  . Body mass index (BMI) of 25.0-25.9 in adult 02/01/2016  . Blood pressure elevated without history of HTN 02/01/2016  . Hemorrhoid 02/01/2016  . Intestinal malabsorption 02/01/2016  .  Gastro-esophageal reflux disease without esophagitis 02/01/2016  . Skin lesion 02/01/2016  . Gastroesophageal reflux disease 11/07/2015  . 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 Breast pain, left 09/02/2013  . Hypercholesteremia 09/08/2009  . H/O: hysterectomy 07/21/2009  . Narrowing of intervertebral disc space 07/21/2009  . Lumbar canal stenosis 07/21/2009  . Arthropathia 05/20/2009  . OP (osteoporosis) 07/23/2003    Past Surgical History:  Procedure Laterality Date  . ABDOMINAL HYSTERECTOMY  1978   and oophorectomy  . APPENDECTOMY  1945  . BUNIONECTOMY    . FOOT SURGERY Bilateral   . REPLACEMENT TOTAL KNEE      Her family history includes Breast cancer in her mother; Heart attack in her father; Prostate cancer in her father.      Current Outpatient Prescriptions:  .  aspirin 81 MG chewable tablet, Chew 81 mg by mouth daily., Disp: , Rfl:  .  hydrocortisone (ANUSOL-HC) 25 MG suppository, Place 1 suppository (25 mg total) rectally 2 (two) times daily., Disp: 12 suppository, Rfl: 0 .  meloxicam (MOBIC) 15 MG tablet, Take 1 tablet (15 mg total) by mouth daily., Disp: 90 tablet, Rfl: 1 .  Multiple Vitamins-Minerals (MULTIVITAMIN WITH MINERALS) tablet, Take 1 tablet by mouth daily., Disp: , Rfl:  .  omeprazole (PRILOSEC) 20 MG capsule, Take 1 capsule (20 mg total) by mouth daily., Disp: 90 capsule, Rfl: 1  Patient Care Team: Margaretann LovelessJennifer M Burnette, PA-C as PCP - General (Family Medicine) Earline MayotteJeffrey W Byrnett, MD (General  Surgery)     Objective:   Vitals: BP 140/80 (BP Location: Left Arm, Patient Position: Sitting, Cuff Size: Large)   Pulse 72   Temp 97.9 F (36.6 C) (Oral)   Resp 16   Ht 5\' 3"  (1.6 m)   Wt 146 lb 3.2 oz (66.3 kg)   SpO2 99%   BMI 25.90 kg/m   Physical Exam  Constitutional: She is oriented to person, place, and time. She appears well-developed and well-nourished. No distress.  HENT:  Head: Normocephalic and atraumatic.  Right  Ear: Hearing, tympanic membrane, external ear and ear canal normal.  Left Ear: Hearing, tympanic membrane, external ear and ear canal normal.  Nose: Nose normal.  Mouth/Throat: Uvula is midline, oropharynx is clear and moist and mucous membranes are normal. No oropharyngeal exudate.  TM with cerumen impaction bilaterally; ear lavage successful bilaterally. TM visualized and normal. Mild erythema noted in canal secondary to cleaning but otherwise normal.    Eyes: Conjunctivae and EOM are normal. Pupils are equal, round, and reactive to light. Right eye exhibits no discharge. Left eye exhibits no discharge. No scleral icterus.  Neck: Normal range of motion. Neck supple. No JVD present. Carotid bruit is not present. No tracheal deviation present. No thyromegaly present.  Cardiovascular: Normal rate, regular rhythm, normal heart sounds and intact distal pulses.  Exam reveals no gallop and no friction rub.   No murmur heard. Pulmonary/Chest: Effort normal and breath sounds normal. No respiratory distress. She has no wheezes. She has no rales. She exhibits no tenderness.  Abdominal: Soft. Bowel sounds are normal. She exhibits no distension and no mass. There is no tenderness. There is no rebound and no guarding.  Musculoskeletal: Normal range of motion. She exhibits no edema or tenderness.  Lymphadenopathy:    She has no cervical adenopathy.  Neurological: She is alert and oriented to person, place, and time.  Skin: Skin is warm and dry. No rash noted. She is not diaphoretic.  Psychiatric: She has a normal mood and affect. Her behavior is normal. Judgment and thought content normal.  Vitals reviewed.   Activities of Daily Living In your present state of health, do you have any difficulty performing the following activities: 03/11/2017  Hearing? N  Vision? N  Difficulty concentrating or making decisions? N  Walking or climbing stairs? N  Dressing or bathing? N  Doing errands, shopping? N  Some  recent data might be hidden    Fall Risk Assessment Fall Risk  03/11/2017 02/27/2016  Falls in the past year? Yes No  Number falls in past yr: 1 -  Injury with Fall? No -     Depression Screen PHQ 2/9 Scores 03/11/2017 02/27/2016  PHQ - 2 Score 0 0  PHQ- 9 Score 2 -    Cognitive Testing - 6-CIT  Correct? Score   What year is it? yes 0 0 or 4  What month is it? yes 0 0 or 3  Memorize:    Floyde Parkins,  44,  [address]  [place],      What time is it? (within 1 hour) yes 0 0 or 3  Count backwards from 20 yes 0 0, 2, or 4  Name the months of the year no 2 0, 2, or 4  Repeat name & address above no 4 0, 2, 4, 6, 8, or 10       TOTAL SCORE  6/28   Interpretation:  Normal  Normal (0-7) Abnormal (8-28)  Home Exercise  03/11/2017  Current Exercise Habits Home exercise routine;Structured exercise class  Time (Minutes) 45  Frequency (Times/Week) 6  Weekly Exercise (Minutes/Week) 270  Intensity Moderate    Assessment & Plan:     Annual Wellness Visit  Reviewed patient's Family Medical History Reviewed and updated list of patient's medical providers Assessment of cognitive impairment was done Assessed patient's functional ability Established a written schedule for health screening services Health Risk Assessent Completed and Reviewed  Exercise Activities and Dietary recommendations Goals    None      Immunization History  Administered Date(s) Administered  . Influenza, High Dose Seasonal PF 09/22/2015, 10/02/2016  . Pneumococcal Conjugate-13 03/12/2014  . Pneumococcal Polysaccharide-23 10/25/2004  . Tdap 10/24/2004, 09/12/2011  . Zoster 09/18/2008    Health Maintenance  Topic Date Due  . DEXA SCAN  12/17/2003  . TETANUS/TDAP  09/11/2021  . INFLUENZA VACCINE  Completed  . PNA vac Low Risk Adult  Completed     Discussed health benefits of physical activity, and encouraged her to engage in regular exercise appropriate for her age and condition.    1. Medicare  annual wellness visit, subsequent Normal exam today.   2. Breast cancer screening There is  family history of breast cancer in her mother. She does perform regular self breast exams. Mammogram was ordered as below. Information for [facility] clinic was given to patient so she may schedule her mammogram at her convenience. - MM Digital Screening; Future  3. Family history of breast cancer in mother - MM Digital Screening; Future  4. Hemorrhoids, unspecified hemorrhoid type Improving since 03/11/17. She is using Anusol-HC suppositories, tuks pads and preparation H cream.   5. Hypercholesteremia Diet controlled. Will check labs as below and f/u pending results. - CBC w/Diff/Platelet - Comprehensive Metabolic Panel (CMET) - Lipid Profile  6. Blood pressure elevated without history of HTN Slight elevation today. Will continue to monitor and at home readings are normal. Will check labs as below and f/u pending results. - CBC w/Diff/Platelet - Comprehensive Metabolic Panel (CMET) - Lipid Profile  7. Anemia, unspecified type H/O this. Will check labs as below and f/u pending results. - CBC w/Diff/Platelet - Comprehensive Metabolic Panel (CMET) - Lipid Profile  8. Bilateral impacted cerumen Ear lavage successful. Advised patient to use Debrox drops for cleaning at least once weekly to keep wax soft. No q-tip use. - Ear Lavage  9. Need for pneumococcal vaccination Pneumovax Vaccine given to patient without complications. Patient sat for 15 minutes after administration and was tolerated well without adverse effects. - Pneumococcal polysaccharide vaccine 23-valent greater than or equal to 2yo subcutaneous/IM  ------------------------------------------------------------------------------------------------------------    Margaretann Loveless, PA-C  [facility]

## 2017-03-18 NOTE — Patient Instructions (Signed)
Health Maintenance for Postmenopausal Women Menopause is a normal process in which your reproductive ability comes to an end. This process happens gradually over a span of months to years, usually between the ages of 33 and 38. Menopause is complete when you have missed 12 consecutive menstrual periods. It is important to talk with your health care provider about some of the most common conditions that affect postmenopausal women, such as heart disease, cancer, and bone loss (osteoporosis). Adopting a healthy lifestyle and getting preventive care can help to promote your health and wellness. Those actions can also lower your chances of developing some of these common conditions. What should I know about menopause? During menopause, you may experience a number of symptoms, such as:  Moderate-to-severe hot flashes.  Night sweats.  Decrease in sex drive.  Mood swings.  Headaches.  Tiredness.  Irritability.  Memory problems.  Insomnia. Choosing to treat or not to treat menopausal changes is an individual decision that you make with your health care provider. What should I know about hormone replacement therapy and supplements? Hormone therapy products are effective for treating symptoms that are associated with menopause, such as hot flashes and night sweats. Hormone replacement carries certain risks, especially as you become older. If you are thinking about using estrogen or estrogen with progestin treatments, discuss the benefits and risks with your health care provider. What should I know about heart disease and stroke? Heart disease, heart attack, and stroke become more likely as you age. This may be due, in part, to the hormonal changes that your body experiences during menopause. These can affect how your body processes dietary fats, triglycerides, and cholesterol. Heart attack and stroke are both medical emergencies. There are many things that you can do to help prevent heart disease  and stroke:  Have your blood pressure checked at least every 1-2 years. High blood pressure causes heart disease and increases the risk of stroke.  If you are 48-61 years old, ask your health care provider if you should take aspirin to prevent a heart attack or a stroke.  Do not use any tobacco products, including cigarettes, chewing tobacco, or electronic cigarettes. If you need help quitting, ask your health care provider.  It is important to eat a healthy diet and maintain a healthy weight.  Be sure to include plenty of vegetables, fruits, low-fat dairy products, and lean protein.  Avoid eating foods that are high in solid fats, added sugars, or salt (sodium).  Get regular exercise. This is one of the most important things that you can do for your health.  Try to exercise for at least 150 minutes each week. The type of exercise that you do should increase your heart rate and make you sweat. This is known as moderate-intensity exercise.  Try to do strengthening exercises at least twice each week. Do these in addition to the moderate-intensity exercise.  Know your numbers.Ask your health care provider to check your cholesterol and your blood glucose. Continue to have your blood tested as directed by your health care provider. What should I know about cancer screening? There are several types of cancer. Take the following steps to reduce your risk and to catch any cancer development as early as possible. Breast Cancer  Practice breast self-awareness.  This means understanding how your breasts normally appear and feel.  It also means doing regular breast self-exams. Let your health care provider know about any changes, no matter how small.  If you are 40 or older,  have a clinician do a breast exam (clinical breast exam or CBE) every year. Depending on your age, family history, and medical history, it may be recommended that you also have a yearly breast X-ray (mammogram).  If you  have a family history of breast cancer, talk with your health care provider about genetic screening.  If you are at high risk for breast cancer, talk with your health care provider about having an MRI and a mammogram every year.  Breast cancer (BRCA) gene test is recommended for women who have family members with BRCA-related cancers. Results of the assessment will determine the need for genetic counseling and BRCA1 and for BRCA2 testing. BRCA-related cancers include these types:  Breast. This occurs in males or females.  Ovarian.  Tubal. This may also be called fallopian tube cancer.  Cancer of the abdominal or pelvic lining (peritoneal cancer).  Prostate.  Pancreatic. Cervical, Uterine, and Ovarian Cancer  Your health care provider may recommend that you be screened regularly for cancer of the pelvic organs. These include your ovaries, uterus, and vagina. This screening involves a pelvic exam, which includes checking for microscopic changes to the surface of your cervix (Pap test).  For women ages 21-65, health care providers may recommend a pelvic exam and a Pap test every three years. For women ages 23-65, they may recommend the Pap test and pelvic exam, combined with testing for human papilloma virus (HPV), every five years. Some types of HPV increase your risk of cervical cancer. Testing for HPV may also be done on women of any age who have unclear Pap test results.  Other health care providers may not recommend any screening for nonpregnant women who are considered low risk for pelvic cancer and have no symptoms. Ask your health care provider if a screening pelvic exam is right for you.  If you have had past treatment for cervical cancer or a condition that could lead to cancer, you need Pap tests and screening for cancer for at least 20 years after your treatment. If Pap tests have been discontinued for you, your risk factors (such as having a new sexual partner) need to be reassessed  to determine if you should start having screenings again. Some women have medical problems that increase the chance of getting cervical cancer. In these cases, your health care provider may recommend that you have screening and Pap tests more often.  If you have a family history of uterine cancer or ovarian cancer, talk with your health care provider about genetic screening.  If you have vaginal bleeding after reaching menopause, tell your health care provider.  There are currently no reliable tests available to screen for ovarian cancer. Lung Cancer  Lung cancer screening is recommended for adults 99-83 years old who are at high risk for lung cancer because of a history of smoking. A yearly low-dose CT scan of the lungs is recommended if you:  Currently smoke.  Have a history of at least 30 pack-years of smoking and you currently smoke or have quit within the past 15 years. A pack-year is smoking an average of one pack of cigarettes per day for one year. Yearly screening should:  Continue until it has been 15 years since you quit.  Stop if you develop a health problem that would prevent you from having lung cancer treatment. Colorectal Cancer  This type of cancer can be detected and can often be prevented.  Routine colorectal cancer screening usually begins at age 72 and continues  through age 75.  If you have risk factors for colon cancer, your health care provider may recommend that you be screened at an earlier age.  If you have a family history of colorectal cancer, talk with your health care provider about genetic screening.  Your health care provider may also recommend using home test kits to check for hidden blood in your stool.  A small camera at the end of a tube can be used to examine your colon directly (sigmoidoscopy or colonoscopy). This is done to check for the earliest forms of colorectal cancer.  Direct examination of the colon should be repeated every 5-10 years until  age 75. However, if early forms of precancerous polyps or small growths are found or if you have a family history or genetic risk for colorectal cancer, you may need to be screened more often. Skin Cancer  Check your skin from head to toe regularly.  Monitor any moles. Be sure to tell your health care provider:  About any new moles or changes in moles, especially if there is a change in a mole's shape or color.  If you have a mole that is larger than the size of a pencil eraser.  If any of your family members has a history of skin cancer, especially at a young age, talk with your health care provider about genetic screening.  Always use sunscreen. Apply sunscreen liberally and repeatedly throughout the day.  Whenever you are outside, protect yourself by wearing long sleeves, pants, a wide-brimmed hat, and sunglasses. What should I know about osteoporosis? Osteoporosis is a condition in which bone destruction happens more quickly than new bone creation. After menopause, you may be at an increased risk for osteoporosis. To help prevent osteoporosis or the bone fractures that can happen because of osteoporosis, the following is recommended:  If you are 19-50 years old, get at least 1,000 mg of calcium and at least 600 mg of vitamin D per day.  If you are older than age 50 but younger than age 70, get at least 1,200 mg of calcium and at least 600 mg of vitamin D per day.  If you are older than age 70, get at least 1,200 mg of calcium and at least 800 mg of vitamin D per day. Smoking and excessive alcohol intake increase the risk of osteoporosis. Eat foods that are rich in calcium and vitamin D, and do weight-bearing exercises several times each week as directed by your health care provider. What should I know about how menopause affects my mental health? Depression may occur at any age, but it is more common as you become older. Common symptoms of depression include:  Low or sad  mood.  Changes in sleep patterns.  Changes in appetite or eating patterns.  Feeling an overall lack of motivation or enjoyment of activities that you previously enjoyed.  Frequent crying spells. Talk with your health care provider if you think that you are experiencing depression. What should I know about immunizations? It is important that you get and maintain your immunizations. These include:  Tetanus, diphtheria, and pertussis (Tdap) booster vaccine.  Influenza every year before the flu season begins.  Pneumonia vaccine.  Shingles vaccine. Your health care provider may also recommend other immunizations. This information is not intended to replace advice given to you by your health care provider. Make sure you discuss any questions you have with your health care provider. Document Released: 02/01/2006 Document Revised: 06/29/2016 Document Reviewed: 09/13/2015 Elsevier Interactive Patient   Education  2017 [employer]  

## 2017-03-22 DIAGNOSIS — E7800 Pure hypercholesterolemia, unspecified: Secondary | ICD-10-CM | POA: Diagnosis not present

## 2017-03-22 DIAGNOSIS — D649 Anemia, unspecified: Secondary | ICD-10-CM | POA: Diagnosis not present

## 2017-03-22 DIAGNOSIS — R030 Elevated blood-pressure reading, without diagnosis of hypertension: Secondary | ICD-10-CM | POA: Diagnosis not present

## 2017-03-23 LAB — CBC WITH DIFFERENTIAL/PLATELET
BASOS ABS: 0 10*3/uL (ref 0.0–0.2)
Basos: 1 %
EOS (ABSOLUTE): 0.2 10*3/uL (ref 0.0–0.4)
Eos: 4 %
HEMOGLOBIN: 13.1 g/dL (ref 11.1–15.9)
Hematocrit: 39.9 % (ref 34.0–46.6)
IMMATURE GRANS (ABS): 0 10*3/uL (ref 0.0–0.1)
IMMATURE GRANULOCYTES: 0 %
LYMPHS: 33 %
Lymphocytes Absolute: 2.1 10*3/uL (ref 0.7–3.1)
MCH: 30 pg (ref 26.6–33.0)
MCHC: 32.8 g/dL (ref 31.5–35.7)
MCV: 91 fL (ref 79–97)
MONOCYTES: 11 %
Monocytes Absolute: 0.7 10*3/uL (ref 0.1–0.9)
Neutrophils Absolute: 3.2 10*3/uL (ref 1.4–7.0)
Neutrophils: 51 %
Platelets: 294 10*3/uL (ref 150–379)
RBC: 4.37 x10E6/uL (ref 3.77–5.28)
RDW: 12.8 % (ref 12.3–15.4)
WBC: 6.2 10*3/uL (ref 3.4–10.8)

## 2017-03-23 LAB — LIPID PANEL
CHOLESTEROL TOTAL: 230 mg/dL — AB (ref 100–199)
Chol/HDL Ratio: 3 ratio units (ref 0.0–4.4)
HDL: 77 mg/dL (ref >39)
LDL Calculated: 141 mg/dL — ABNORMAL HIGH (ref 0–99)
TRIGLYCERIDES: 59 mg/dL (ref 0–149)
VLDL Cholesterol Cal: 12 mg/dL (ref 5–40)

## 2017-03-23 LAB — COMPREHENSIVE METABOLIC PANEL
ALBUMIN: 4.1 g/dL (ref 3.5–4.8)
ALK PHOS: 95 IU/L (ref 39–117)
ALT: 12 IU/L (ref 0–32)
AST: 19 IU/L (ref 0–40)
Albumin/Globulin Ratio: 1.6 (ref 1.2–2.2)
BUN / CREAT RATIO: 24 (ref 12–28)
BUN: 21 mg/dL (ref 8–27)
Bilirubin Total: 0.3 mg/dL (ref 0.0–1.2)
CO2: 24 mmol/L (ref 18–29)
CREATININE: 0.87 mg/dL (ref 0.57–1.00)
Calcium: 9.4 mg/dL (ref 8.7–10.3)
Chloride: 102 mmol/L (ref 96–106)
GFR calc non Af Amer: 64 mL/min/{1.73_m2} (ref >59)
GFR, EST AFRICAN AMERICAN: 74 mL/min/{1.73_m2} (ref >59)
GLUCOSE: 85 mg/dL (ref 65–99)
Globulin, Total: 2.5 g/dL (ref 1.5–4.5)
Potassium: 4.3 mmol/L (ref 3.5–5.2)
Sodium: 142 mmol/L (ref 134–144)
TOTAL PROTEIN: 6.6 g/dL (ref 6.0–8.5)

## 2017-03-25 NOTE — Progress Notes (Signed)
Advised  ED 

## 2017-04-05 DIAGNOSIS — H524 Presbyopia: Secondary | ICD-10-CM | POA: Diagnosis not present

## 2017-04-15 ENCOUNTER — Ambulatory Visit
Admission: RE | Admit: 2017-04-15 | Discharge: 2017-04-15 | Disposition: A | Discharge status: Home / Self Care | Payer: Medicare Other | Source: Ambulatory Visit | Attending: Physician Assistant | Admitting: Physician Assistant

## 2017-04-15 DIAGNOSIS — Z1231 Encounter for screening mammogram for malignant neoplasm of breast: Secondary | ICD-10-CM | POA: Insufficient documentation

## 2017-04-15 DIAGNOSIS — Z1239 Encounter for other screening for malignant neoplasm of breast: Secondary | ICD-10-CM

## 2017-04-15 DIAGNOSIS — Z803 Family history of malignant neoplasm of breast: Secondary | ICD-10-CM

## 2017-04-19 DIAGNOSIS — M5136 Other intervertebral disc degeneration, lumbar region: Secondary | ICD-10-CM | POA: Diagnosis not present

## 2017-04-19 DIAGNOSIS — M5416 Radiculopathy, lumbar region: Secondary | ICD-10-CM | POA: Diagnosis not present

## 2017-10-01 DIAGNOSIS — Z23 Encounter for immunization: Secondary | ICD-10-CM | POA: Diagnosis not present

## 2017-10-11 IMAGING — MG MM DIGITAL SCREENING BILAT W/ CAD
4 series · 4 of 4 positions shown · non-contrast
Comparison: Previous exam(s).

CLINICAL DATA: Screening.

EXAM:
DIGITAL SCREENING BILATERAL MAMMOGRAM WITH CAD

[L MLO]
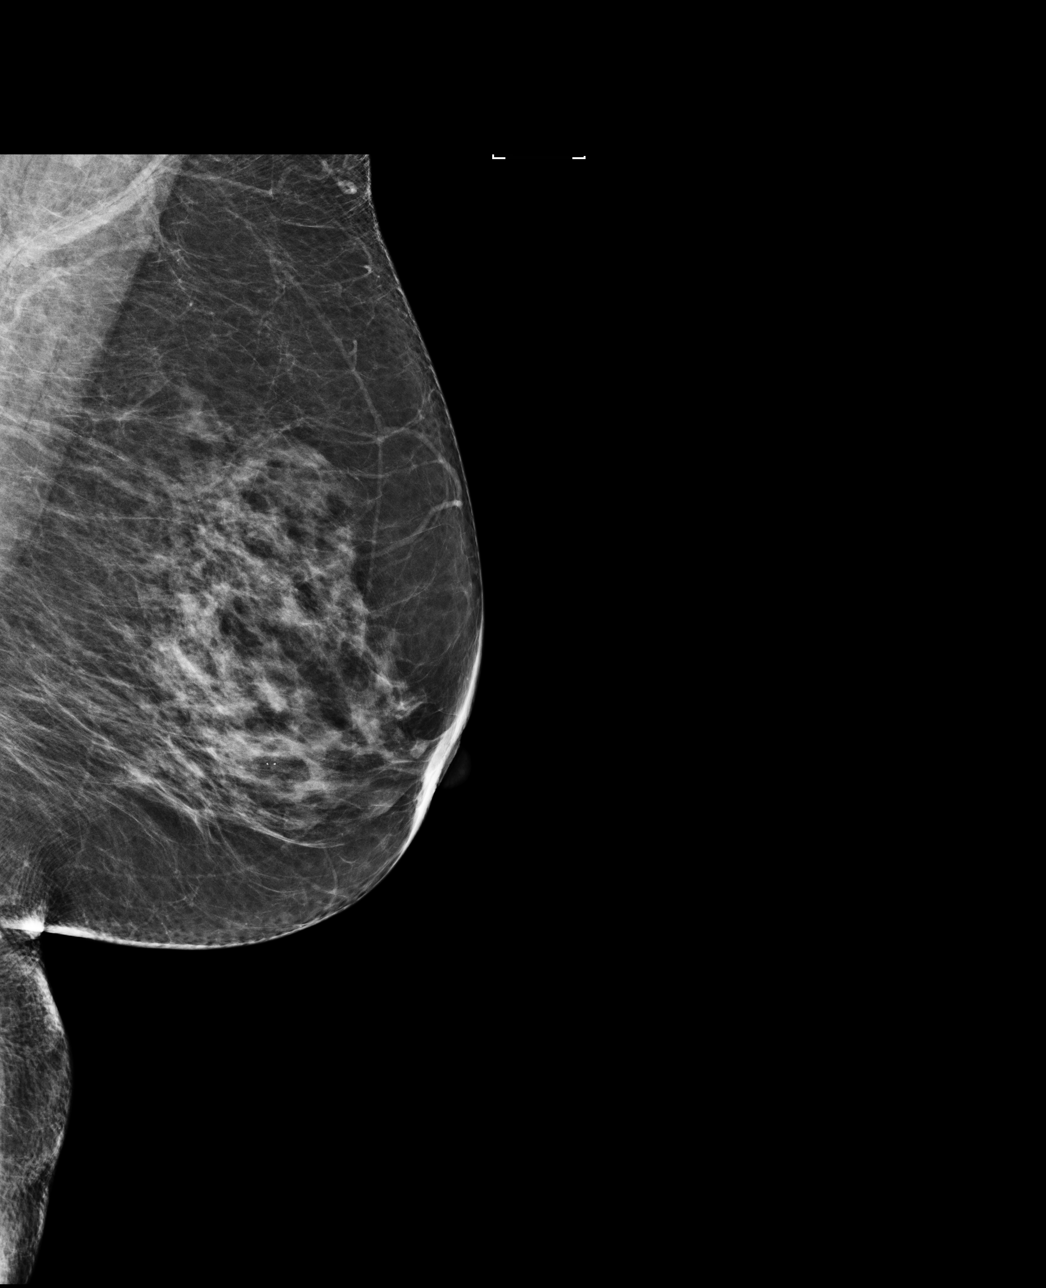

[R MLO]
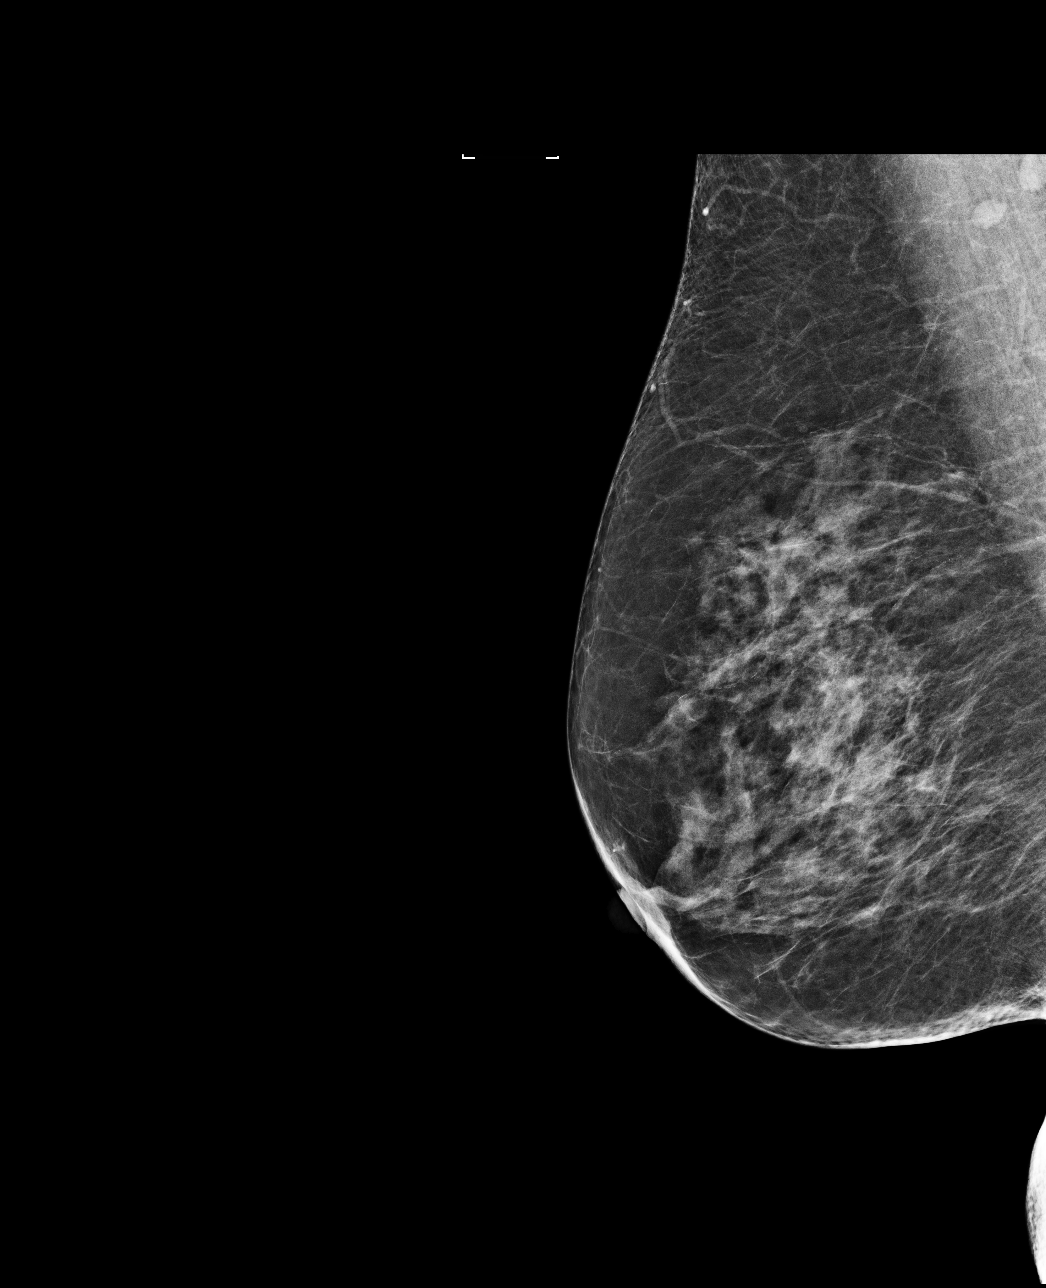

[R CC]
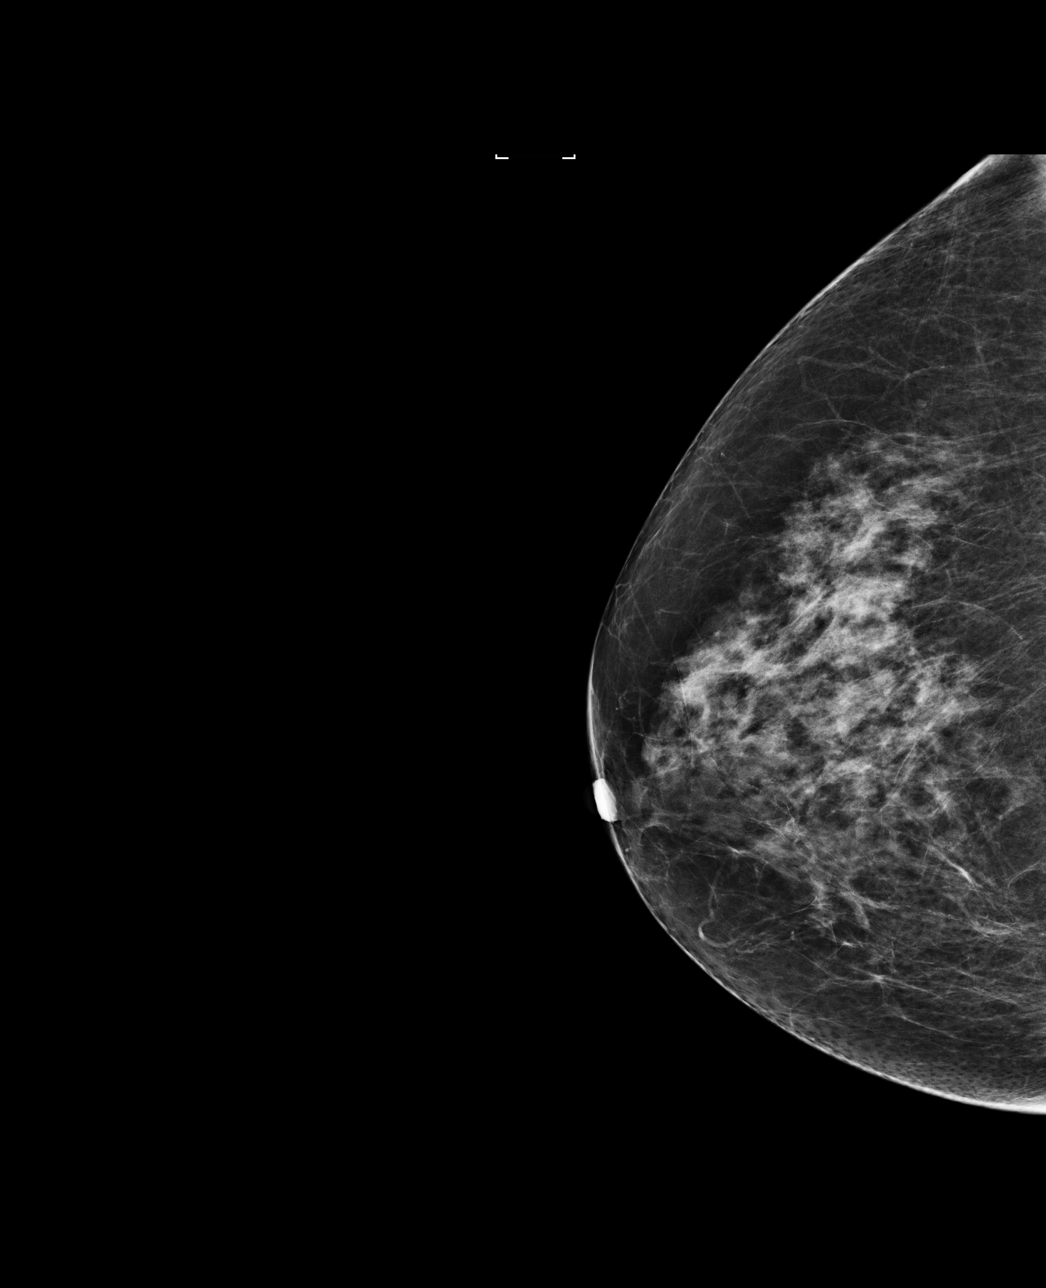

[L CC]
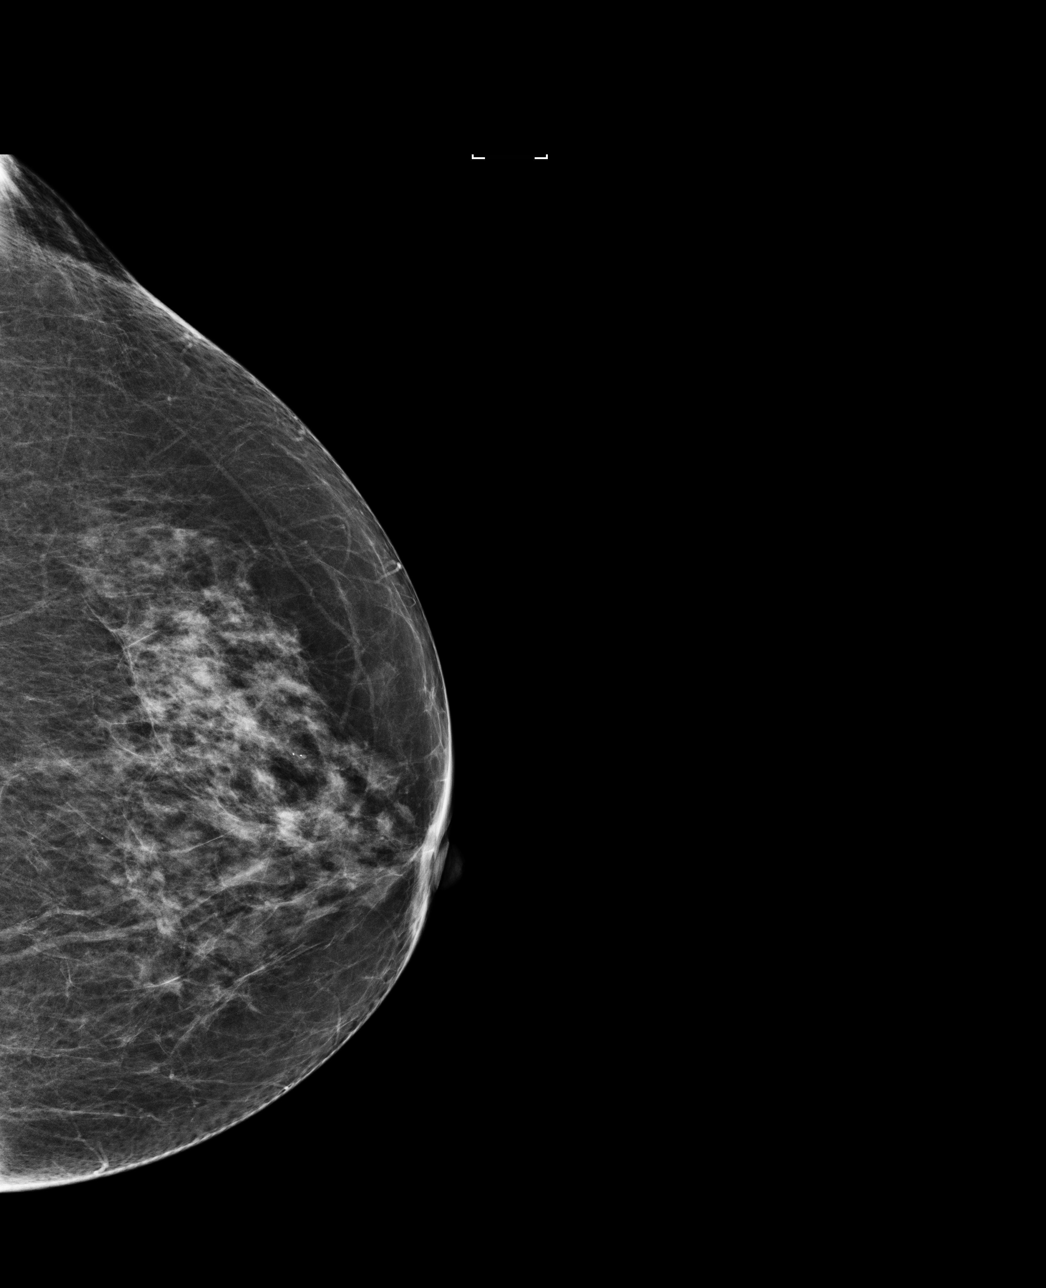

[4 of 4 positions shown; findings below may reference images not displayed]

ACR Breast Density Category c: The breast tissue is heterogeneously
dense, which may obscure small masses.
FINDINGS: There are no findings suspicious for malignancy. Images were
processed with CAD.
IMPRESSION: No mammographic evidence of malignancy. A result letter of this
screening mammogram will be mailed directly to the patient.

RECOMMENDATION:
Screening mammogram in one year. (Code:[id number])

BI-RADS CATEGORY  1: Negative.

## 2017-10-24 ENCOUNTER — Other Ambulatory Visit: Payer: Self-pay | Admitting: Physician Assistant

## 2017-10-24 DIAGNOSIS — K219 Gastro-esophageal reflux disease without esophagitis: Secondary | ICD-10-CM

## 2017-10-28 ENCOUNTER — Ambulatory Visit: Payer: Medicare Other | Admitting: Family Medicine

## 2017-10-28 ENCOUNTER — Encounter: Payer: Self-pay | Admitting: Family Medicine

## 2017-10-28 DIAGNOSIS — M25512 Pain in left shoulder: Secondary | ICD-10-CM | POA: Insufficient documentation

## 2017-10-28 DIAGNOSIS — G8929 Other chronic pain: Secondary | ICD-10-CM

## 2017-10-28 MED ORDER — BACLOFEN 10 MG PO TABS: 10.0000 mg | ORAL_TABLET | Freq: Three times a day (TID) | ORAL | 1 refills | Status: DC | PRN

## 2017-10-28 NOTE — Assessment & Plan Note (Signed)
Significant pathology of L shoulder and rotator cuff on MRI in 2016 Pain likely muscle spasm Trial of baclofen HEP given for stretching and rotator cuff strengthening Reassured patient that this is not cardiac in etiology Return precautions discussed

## 2017-10-28 NOTE — Patient Instructions (Signed)
Do stretches and exercises twice daily.  Use baclofen as needed for muscle spasms   Muscle Cramps and Spasms Muscle cramps and spasms are when muscles tighten by themselves. They usually get better within minutes. Muscle cramps are painful. They are usually stronger and last longer than muscle spasms. Muscle spasms may or may not be painful. They can last a few seconds or much longer. Follow these instructions at home:  Drink enough fluid to keep your pee (urine) clear or pale yellow.  Massage, stretch, and relax the muscle.  If directed, apply heat to tight or tense muscles as often as told by your doctor. Use the heat source that your doctor recommends. ? Place a towel between your skin and the heat source. ? Leave the heat on for 20-30 minutes. ? Take off the heat if your skin turns bright red. This is especially important if you are unable to feel pain, heat, or cold. You may have a greater risk of getting burned.  If directed, put ice on the affected area. This may help if you are sore or have pain after a cramp or spasm. ? Put ice in a plastic bag. ? Place a towel between your skin and the bag. ? Leave the ice on for 20 minutes, 2-3 times a day.  Take over-the-counter and prescription medicines only as told by your doctor.  Pay attention to any changes in your symptoms. Contact a doctor if:  Your cramps or spasms get worse or happen more often.  Your cramps or spasms do not get better with time. This information is not intended to replace advice given to you by your health care provider. Make sure you discuss any questions you have with your health care provider. Document Released: 11/22/2008 Document Revised: 01/11/2016 Document Reviewed: 09/13/2015 Elsevier Interactive Patient Education  2018 [employer]

## 2017-10-28 NOTE — Progress Notes (Signed)
Patient: Jacqueline Daniels Female    DOB: 1936/11/21   81 y.o.   MRN: [id number] Visit Date: 10/28/2017  Today's Provider: Shirlee LatchAngela Bacigalupo, MD   Chief Complaint  Patient presents with  . Arm Problem   Subjective:    HPI   Jacqueline Daniels is c/o left arm problems. Jacqueline Daniels was diagnosed with left supraspinatus tendon tear by MRI on 04/28/2015.  L shoulder pain with radiation to L neck intermittently for last few months.  Seems to be more frequent than it was previously.  States that shoulder pain bothers her for ~5 minutes before subsiding.  Can occur with activity or rest.  No associated SOB, CP, diaphoresis, nausea, numbness, weakness.  Jacqueline Daniels has no ROM limitations.  No Known Allergies   Current Outpatient Medications:  .  meloxicam (MOBIC) 15 MG tablet, Take 1 tablet (15 mg total) by mouth daily., Disp: 90 tablet, Rfl: 1 .  Multiple Vitamins-Minerals (MULTIVITAMIN WITH MINERALS) tablet, Take 1 tablet by mouth daily., Disp: , Rfl:  .  omeprazole (PRILOSEC) 20 MG capsule, TAKE 1 CAPSULE BY MOUTH EVERY DAY, Disp: 90 capsule, Rfl: 1 .  aspirin 81 MG chewable tablet, Chew 81 mg by mouth daily., Disp: , Rfl:  .  baclofen (LIORESAL) 10 MG tablet, Take 1 tablet (10 mg total) 3 (three) times daily as needed by mouth for muscle spasms., Disp: 60 each, Rfl: 1  Review of Systems  Constitutional: Negative for activity change, appetite change, chills, diaphoresis, fatigue, fever and unexpected weight change.  Respiratory: Negative for shortness of breath.   Cardiovascular: Negative for chest pain, palpitations and leg swelling.  Gastrointestinal: Negative for nausea and vomiting.  Musculoskeletal: Positive for back pain (F/B Dr. Yves Dillhasnis).    Social History   Tobacco Use  . Smoking status: Former Smoker    Packs/day: 1.00    Years: 5.00    Pack years: 5.00    Last attempt to quit: 12/23/1973    Years since quitting: 43.8  . Smokeless tobacco: Never Used  Substance Use Topics  . Alcohol  use: No   Objective:   BP 122/62 (BP Location: Right Arm, Patient Position: Sitting, Cuff Size: Normal)   Pulse 76   Temp 97.7 F (36.5 C) (Oral)   Resp 16   Wt 136 lb (61.7 kg)   BMI 24.09 kg/m  Vitals:   10/28/17 1340  BP: 122/62  Pulse: 76  Resp: 16  Temp: 97.7 F (36.5 C)  TempSrc: Oral  Weight: 136 lb (61.7 kg)     Physical Exam  Constitutional: Jacqueline Daniels is oriented to person, place, and time. Jacqueline Daniels appears well-developed and well-nourished. No distress.  HENT:  Head: Normocephalic and atraumatic.  Eyes: Conjunctivae are normal. No scleral icterus.  Neck: Neck supple. No thyromegaly present.  Cardiovascular: Normal rate, regular rhythm and normal heart sounds.  No murmur heard. Pulmonary/Chest: Effort normal and breath sounds normal. No respiratory distress. Jacqueline Daniels has no wheezes. Jacqueline Daniels has no rales.  Musculoskeletal: Jacqueline Daniels exhibits no edema or deformity.  L Shoulder:  Inspection reveals no abnormalities, atrophy or asymmetry. Palpation is normal with no tenderness over AC joint or bicipital groove. ROM is full in all planes. Rotator cuff strength normal throughout. + signs of impingement with positive Hawkin's tests, empty can. Speeds and Yergason's tests normal. No labral pathology noted with negative Obrien's, negative clunk and good stability. Normal scapular function observed. No painful arc and no drop arm sign. No apprehension sign + tightness of trapezius  muscles  Lymphadenopathy:    Jacqueline Daniels has no cervical adenopathy.  Neurological: Jacqueline Daniels is alert and oriented to person, place, and time.  Skin: Skin is warm and dry. No rash noted.  Psychiatric: Jacqueline Daniels has a normal mood and affect. Her behavior is normal.  Vitals reviewed.   MRI L shoulder 04/2015 reviewed personally: IMPRESSION: 1. Full-thickness retracted supraspinatus tendon tear with marked fatty atrophy of the supraspinatus muscle. 2. Significant infraspinatus tendinopathy. 3. Small articular surface tear involving  the subscapularis tendon. 4. Torn and retracted long head biceps tendon. 5. Degenerated and torn superior labrum. 6. AC joint degenerative changes and lateral downsloping of the acromion possibly contributing to bony impingement. 7. Glenohumeral joint degenerative changes.    Assessment & Plan:      Problem List Items Addressed This Visit      Other   Left shoulder pain    Significant pathology of L shoulder and rotator cuff on MRI in 2016 Pain likely muscle spasm Trial of baclofen HEP given for stretching and rotator cuff strengthening Reassured patient that this is not cardiac in etiology Return precautions discussed             The entirety of the information documented in the History of Present Illness, Review of Systems and Physical Exam were personally obtained by me. Portions of this information were initially documented by Irving Burton Ratchford, CMA and reviewed by me for thoroughness and accuracy.     Shirlee Latch, MD  [facility]

## 2017-10-29 ENCOUNTER — Other Ambulatory Visit: Payer: Self-pay | Admitting: Physician Assistant

## 2018-02-11 ENCOUNTER — Telehealth: Payer: Self-pay | Admitting: Family Medicine

## 2018-02-11 NOTE — Telephone Encounter (Signed)
Error

## 2018-03-03 ENCOUNTER — Encounter: Payer: Self-pay | Admitting: Family Medicine

## 2018-03-03 ENCOUNTER — Ambulatory Visit: Payer: Medicare Other | Admitting: Family Medicine

## 2018-03-03 VITALS — BP 140/88 | HR 71 | Temp 98.1°F | Resp 16 | Wt 135.0 lb

## 2018-03-03 DIAGNOSIS — J069 Acute upper respiratory infection, unspecified: Secondary | ICD-10-CM

## 2018-03-03 DIAGNOSIS — H6123 Impacted cerumen, bilateral: Secondary | ICD-10-CM | POA: Diagnosis not present

## 2018-03-03 MED ORDER — FLUTICASONE PROPIONATE 50 MCG/ACT NA SUSP: 2.0000 | Freq: Every day | NASAL | 6 refills | Status: DC

## 2018-03-03 NOTE — Progress Notes (Signed)
Patient: Jacqueline Daniels Female    DOB: [birth year]   82 y.o.   MRN: [id number] Visit Date: 03/03/2018  Today's Provider: Shirlee Latch, MD   I, Joslyn Hy, CMA, am acting as scribe for Shirlee Latch, MD.  Chief Complaint  Patient presents with  . URI   Subjective:    URI   This is a new problem. Episode onset: x 5 days. The problem has been gradually worsening. Maximum temperature: has no documented temeperature, but states her face has been "blood red". Associated symptoms include congestion, coughing (was productive of green sputum, but is now dry), nausea, a plugged ear sensation, rhinorrhea, sinus pain, sneezing, vomiting and wheezing. Pertinent negatives include no abdominal pain, chest pain, diarrhea, dysuria, ear pain, headaches, neck pain, sore throat or swollen glands. Treatments tried: Coricidin. The treatment provided moderate relief.   Thing that worried patient more than anything was a wheezing in her chest when she was laying down to sleep last night.  This is more concerning as she is not able to bring anything up with cough.    No Known Allergies   Current Outpatient Medications:  .  aspirin 81 MG chewable tablet, Chew 81 mg by mouth daily., Disp: , Rfl:  .  meloxicam (MOBIC) 15 MG tablet, TAKE ONE TABLET BY MOUTH EVERY DAY, Disp: 90 tablet, Rfl: 1 .  Multiple Vitamins-Minerals (MULTIVITAMIN WITH MINERALS) tablet, Take 1 tablet by mouth daily., Disp: , Rfl:  .  omeprazole (PRILOSEC) 20 MG capsule, TAKE 1 CAPSULE BY MOUTH EVERY DAY, Disp: 90 capsule, Rfl: 1  Review of Systems  HENT: Positive for congestion, rhinorrhea, sinus pain and sneezing. Negative for ear pain and sore throat.   Respiratory: Positive for cough (was productive of green sputum, but is now dry) and wheezing.   Cardiovascular: Negative for chest pain.  Gastrointestinal: Positive for nausea and vomiting. Negative for abdominal pain and diarrhea.  Genitourinary: Negative for  dysuria.  Musculoskeletal: Negative for neck pain.  Neurological: Negative for headaches.    Social History   Tobacco Use  . Smoking status: Former Smoker    Packs/day: 1.00    Years: 5.00    Pack years: 5.00    Last attempt to quit: 12/23/1973    Years since quitting: 44.2  . Smokeless tobacco: Never Used  Substance Use Topics  . Alcohol use: No   Objective:   BP 140/88 (BP Location: Left Arm, Patient Position: Sitting, Cuff Size: Normal)   Pulse 71   Temp 98.1 F (36.7 C) (Oral)   Resp 16   Wt 135 lb (61.2 kg)   HC 16" (40.6 cm)   SpO2 99%   BMI 23.91 kg/m  Vitals:   03/03/18 1316  BP: 140/88  Pulse: 71  Resp: 16  Temp: 98.1 F (36.7 C)  TempSrc: Oral  SpO2: 99%  Weight: 135 lb (61.2 kg)  HC: 16" (40.6 cm)     Physical Exam  Constitutional: She is oriented to person, place, and time. She appears well-developed and well-nourished. No distress.  HENT:  Head: Normocephalic and atraumatic.  Right Ear: External ear and ear canal normal.  Left Ear: External ear and ear canal normal.  Nose: Mucosal edema present. Right sinus exhibits no maxillary sinus tenderness and no frontal sinus tenderness. Left sinus exhibits no maxillary sinus tenderness and no frontal sinus tenderness.  Mouth/Throat: Uvula is midline, oropharynx is clear and moist and mucous membranes are normal.  B/l cerumen impaction  Eyes: Conjunctivae and EOM are normal. Pupils are equal, round, and reactive to light. Right eye exhibits no discharge. Left eye exhibits no discharge. No scleral icterus.  Neck: Neck supple. No thyromegaly present.  Cardiovascular: Normal rate, regular rhythm, normal heart sounds and intact distal pulses.  No murmur heard. Pulmonary/Chest: Effort normal and breath sounds normal. No respiratory distress. She has no wheezes. She has no rales.  Musculoskeletal: She exhibits no edema.  Lymphadenopathy:    She has no cervical adenopathy.  Neurological: She is alert and  oriented to person, place, and time.  Skin: Skin is warm and dry. No rash noted.  Psychiatric: She has a normal mood and affect. Her behavior is normal.  Vitals reviewed.       Assessment & Plan:      1. Viral URI - symptoms consistent with viral process - no evidence of bacterial sinusitis, CAP, etc - discussed natural course, symptomatic management, and return precautions - reassured patient that if she continues to have wheezing or productive cough after 7-10 days, we can get CXR  2. Bilateral impacted cerumen - offered ear lavage today, but patient declines as she doesn't fell well - she plans to come early for next appt to have it done   Meds ordered this encounter  Medications  . fluticasone (FLONASE) 50 MCG/ACT nasal spray    Sig: Place 2 sprays into both nostrils daily.    Dispense:  16 g    Refill:  6     Return if symptoms worsen or fail to improve.   The entirety of the information documented in the History of Present Illness, Review of Systems and Physical Exam were personally obtained by me. Portions of this information were initially documented by Irving BurtonEmily Ratchford, CMA and reviewed by me for thoroughness and accuracy.    Erasmo DownerBacigalupo, Lasonja Lakins M, MD, MPH [facility] 03/03/2018 1:54 PM

## 2018-03-03 NOTE — Patient Instructions (Signed)
Upper Respiratory Infection, Adult Most upper respiratory infections (URIs) are caused by a virus. A URI affects the nose, throat, and upper air passages. The most common type of URI is often called "the common cold." Follow these instructions at home:  Take medicines only as told by your doctor.  Gargle warm saltwater or take cough drops to comfort your throat as told by your doctor.  Use a warm mist humidifier or inhale steam from a shower to increase air moisture. This may make it easier to breathe.  Drink enough fluid to keep your pee (urine) clear or pale yellow.  Eat soups and other clear broths.  Have a healthy diet.  Rest as needed.  Go back to work when your fever is gone or your doctor says it is okay. ? You may need to stay home longer to avoid giving your URI to others. ? You can also wear a face mask and wash your hands often to prevent spread of the virus.  Use your inhaler more if you have asthma.  Do not use any tobacco products, including cigarettes, chewing tobacco, or electronic cigarettes. If you need help quitting, ask your doctor. Contact a doctor if:  You are getting worse, not better.  Your symptoms are not helped by medicine.  You have chills.  You are getting more short of breath.  You have brown or red mucus.  You have yellow or brown discharge from your nose.  You have pain in your face, especially when you bend forward.  You have a fever.  You have puffy (swollen) neck glands.  You have pain while swallowing.  You have white areas in the back of your throat. Get help right away if:  You have very bad or constant: ? Headache. ? Ear pain. ? Pain in your forehead, behind your eyes, and over your cheekbones (sinus pain). ? Chest pain.  You have long-lasting (chronic) lung disease and any of the following: ? Wheezing. ? Long-lasting cough. ? Coughing up blood. ? A change in your usual mucus.  You have a stiff neck.  You have  changes in your: ? Vision. ? Hearing. ? Thinking. ? Mood. This information is not intended to replace advice given to you by your health care provider. Make sure you discuss any questions you have with your health care provider. Document Released: 05/28/2008 Document Revised: 08/12/2016 Document Reviewed: 03/17/2014 Elsevier Interactive Patient Education  2018 Elsevier Inc.  

## 2018-03-13 ENCOUNTER — Other Ambulatory Visit: Payer: Self-pay | Admitting: Family Medicine

## 2018-03-13 DIAGNOSIS — Z1231 Encounter for screening mammogram for malignant neoplasm of breast: Secondary | ICD-10-CM

## 2018-03-21 ENCOUNTER — Ambulatory Visit (INDEPENDENT_AMBULATORY_CARE_PROVIDER_SITE_OTHER): Payer: Medicare Other | Admitting: Family Medicine

## 2018-03-21 ENCOUNTER — Other Ambulatory Visit: Payer: Self-pay

## 2018-03-21 ENCOUNTER — Encounter: Payer: Self-pay | Admitting: Family Medicine

## 2018-03-21 VITALS — BP 150/72 | HR 71 | Temp 97.7°F | Resp 16 | Ht 62.0 in | Wt 136.6 lb

## 2018-03-21 DIAGNOSIS — Z0000 Encounter for general adult medical examination without abnormal findings: Secondary | ICD-10-CM | POA: Diagnosis not present

## 2018-03-21 DIAGNOSIS — E7800 Pure hypercholesterolemia, unspecified: Secondary | ICD-10-CM | POA: Diagnosis not present

## 2018-03-21 DIAGNOSIS — R030 Elevated blood-pressure reading, without diagnosis of hypertension: Secondary | ICD-10-CM | POA: Diagnosis not present

## 2018-03-21 DIAGNOSIS — H6123 Impacted cerumen, bilateral: Secondary | ICD-10-CM

## 2018-03-21 DIAGNOSIS — H93299 Other abnormal auditory perceptions, unspecified ear: Secondary | ICD-10-CM | POA: Diagnosis not present

## 2018-03-21 NOTE — Assessment & Plan Note (Signed)
Recheck lipid panel and CMP Not currently on medications Calculate ASCVD risk and determine if statin may be indicated Ok to d/c ASA for primary prevention given new changes in medical literature

## 2018-03-21 NOTE — Assessment & Plan Note (Signed)
Suspect white coat hypertension Patient reports it is well controlled 120s/70-80s at home Continue to monitor

## 2018-03-21 NOTE — Progress Notes (Signed)
Patient: Jacqueline Daniels, Female    DOB: Apr 29, 1936, 82 y.o.   MRN: [id number] Visit Date: 03/21/2018  Today's Provider: Shirlee Latch, MD   I, Joslyn Hy, CMA, am acting as scribe for Shirlee Latch, MD.  Chief Complaint  Patient presents with  . Medicare Wellness   Subjective:    Annual wellness visit Jacqueline Daniels is a 82 y.o. female. She feels fairly well. She is c/o ear problems. She notes decreased hearing and ear fullness. She states this causes her "equilibrium to be off". She also would like to discuss skin changes. She has a lesion on her RLE that changes color.  She would like to discuss her medications. She has heard that Aspirin is not needed, and would like her PCP's opinion on this. Her arthritic pain is worsening, and would like to know if there is a "better medication" than the Meloxicam she is taking.   She reports exercising daily. She walks for 3 miles every day that she does not play golf. She reports she is sleeping fairly well. She states she has trouble falling asleep about half the time due to arthritic pain.  Last mammogram- 04/16/2017- BI-RADS 1; has mammo scheduled for 04/18/2018 Last BMD- 03/24/2014- WNL, but density decreasing Last colonoscopy- 05/19/2014- hyperplastic polyp. No repeat. -----------------------------------------------------------   Review of Systems  Constitutional: Negative.   HENT: Positive for hearing loss and sinus pressure. Negative for congestion, dental problem, drooling, ear discharge, ear pain, facial swelling, mouth sores, nosebleeds, postnasal drip, rhinorrhea, sinus pain, sneezing, sore throat, tinnitus, trouble swallowing and voice change.   Eyes: Negative.   Respiratory: Negative.   Cardiovascular: Negative.   Gastrointestinal: Negative.   Endocrine: Negative.   Genitourinary: Negative.   Musculoskeletal: Positive for arthralgias and back pain. Negative for gait problem, joint swelling, myalgias, neck  pain and neck stiffness.  Skin: Negative.   Allergic/Immunologic: Negative.   Neurological: Negative.   Hematological: Negative.   Psychiatric/Behavioral: Negative.     Social History   Socioeconomic History  . Marital status: Married    Spouse name: Renae Fickle  . Number of children: 1  . Years of education: 76  . Highest education level: High school graduate  Occupational History    Employer: [employer]  Social Needs  . Financial resource strain: Not hard at all  . Food insecurity:    Worry: Never true    Inability: Never true  . Transportation needs:    Medical: No    Non-medical: No  Tobacco Use  . Smoking status: Former Smoker    Packs/day: 1.00    Years: 5.00    Pack years: 5.00    Last attempt to quit: 12/23/1973    Years since quitting: 44.2  . Smokeless tobacco: Never Used  Substance and Sexual Activity  . Alcohol use: No  . Drug use: No  . Sexual activity: Not Currently  Lifestyle  . Physical activity:    Days per week: 7 days    Minutes per session: 60 min  . Stress: Not on file  Relationships  . Social connections:    Talks on phone: Not on file    Gets together: Not on file    Attends religious service: Not on file    Active member of club or organization: Not on file    Attends meetings of clubs or organizations: Not on file    Relationship status: Not on file  . Intimate partner violence:    Fear of  current or ex partner: Not on file    Emotionally abused: Not on file    Physically abused: Not on file    Forced sexual activity: Not on file  Other Topics Concern  . Not on file  Social History Narrative  . Not on file    Past Medical History:  Diagnosis Date  . Muscle spasm      Patient Active Problem List   Diagnosis Date Noted  . Family history of breast cancer in mother 03/18/2017  . Absolute anemia 02/01/2016  . Anxiety 02/01/2016  . Atypical migraine 02/01/2016  . Body mass index (BMI) of 25.0-25.9 in adult 02/01/2016  . Blood  pressure elevated without history of HTN 02/01/2016  . Hemorrhoid 02/01/2016  . Intestinal malabsorption 02/01/2016  . Gastro-esophageal reflux disease without esophagitis 02/01/2016  . Skin lesion 02/01/2016  . 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 Hypercholesteremia 09/08/2009  . H/O: hysterectomy 07/21/2009  . Lumbar canal stenosis 07/21/2009  . Arthropathia 05/20/2009  . OP (osteoporosis) 07/23/2003    Past Surgical History:  Procedure Laterality Date  . ABDOMINAL HYSTERECTOMY  1978   and oophorectomy  . APPENDECTOMY  1945  . BUNIONECTOMY    . FOOT SURGERY Bilateral   . REPLACEMENT TOTAL KNEE      Her family history includes Breast cancer in her mother; Diabetes in her mother; Heart attack in her father; Prostate cancer in her father.      Current Outpatient Medications:  .  meloxicam (MOBIC) 15 MG tablet, TAKE ONE TABLET BY MOUTH EVERY DAY, Disp: 90 tablet, Rfl: 1 .  Multiple Vitamins-Minerals (MULTIVITAMIN WITH MINERALS) tablet, Take 1 tablet by mouth daily., Disp: , Rfl:  .  omeprazole (PRILOSEC) 20 MG capsule, TAKE 1 CAPSULE BY MOUTH EVERY DAY, Disp: 90 capsule, Rfl: 1 .  fluticasone (FLONASE) 50 MCG/ACT nasal spray, Place 2 sprays into both nostrils daily. (Patient not taking: Reported on 03/21/2018), Disp: 16 g, Rfl: 6  Patient Care Team: Erasmo DownerBacigalupo, Angela M, MD as PCP - General (Family Medicine) Lemar LivingsByrnett, Merrily PewJeffrey W, MD (General Surgery)     Objective:   Vitals: BP (!) 150/72 (BP Location: Left Arm, Patient Position: Sitting, Cuff Size: Normal)   Pulse 71   Temp 97.7 F (36.5 C) (Oral)   Resp 16   Ht 5\' 2"  (1.575 m)   Wt 136 lb 9.6 oz (62 kg)   SpO2 98%   BMI 24.98 kg/m   Physical Exam  Constitutional: She is oriented to person, place, and time. She appears well-developed and well-nourished. No distress.  HENT:  Head: Normocephalic and atraumatic.  Nose: Nose normal.  Mouth/Throat: Oropharynx is clear and moist.  B/l cerumen  impaction  Eyes: Pupils are equal, round, and reactive to light. Conjunctivae and EOM are normal. No scleral icterus.  Neck: Neck supple. No thyromegaly present.  Cardiovascular: Normal rate, regular rhythm, normal heart sounds and intact distal pulses.  No murmur heard. Pulmonary/Chest: Effort normal and breath sounds normal. No respiratory distress. She has no wheezes. She has no rales.  Abdominal: Soft. Bowel sounds are normal. She exhibits no distension. There is no tenderness. There is no rebound and no guarding.  Musculoskeletal: She exhibits no edema or deformity.  Lymphadenopathy:    She has no cervical adenopathy.  Neurological: She is alert and oriented to person, place, and time.  Skin: Skin is warm and dry. No rash noted.  Psychiatric: She has a normal mood and affect. Her behavior is normal.  Vitals reviewed.   Activities of Daily Living In your present state of health, do you have any difficulty performing the following activities: 03/21/2018  Hearing? Y  Vision? N  Difficulty concentrating or making decisions? N  Walking or climbing stairs? N  Dressing or bathing? N  Doing errands, shopping? N  Some recent data might be hidden    Fall Risk Assessment Fall Risk  03/21/2018 03/11/2017 02/27/2016  Falls in the past year? No Yes No  Number falls in past yr: - 1 -  Injury with Fall? - No -     Depression Screen PHQ 2/9 Scores 03/21/2018 03/11/2017 02/27/2016  PHQ - 2 Score 0 0 0  PHQ- 9 Score - 2 -    Cognitive Testing - 6-CIT  Correct? Score   What year is it? yes 0 0 or 4  What month is it? yes 0 0 or 3  Memorize:    Floyde Parkins,  44,  [address]  [place],      What time is it? (within 1 hour) yes 0 0 or 3  Count backwards from 20 yes 0 0, 2, or 4  Name the months of the year yes 0 0, 2, or 4  Repeat name & address above no 2 0, 2, 4, 6, 8, or 10       TOTAL SCORE  2/28   Interpretation:  Normal  Normal (0-7) Abnormal (8-28)   Audit-C Alcohol Use  Screening  Question Answer Points  How often do you have alcoholic drink? never 0  On days you do drink alcohol, how many drinks do you typically consume? N/A 0  How oftey will you drink 6 or more in a total? never 0  Total Score:  0   A score of 3 or more in women, and 4 or more in men indicates increased risk for alcohol abuse, EXCEPT if all of the points are from question 1.     Assessment & Plan:     Annual Wellness Visit  Reviewed patient's Family Medical History Reviewed and updated list of patient's medical providers Assessment of cognitive impairment was done Assessed patient's functional ability Established a written schedule for health screening services Health Risk Assessent Completed and Reviewed  Exercise Activities and Dietary recommendations Goals    None      Immunization History  Administered Date(s) Administered  . Influenza, High Dose Seasonal PF 09/22/2015, 10/02/2016  . Influenza-Unspecified 10/01/2017  . Pneumococcal Conjugate-13 03/12/2014  . Pneumococcal Polysaccharide-23 10/25/2004, 03/18/2017  . Tdap 10/24/2004, 09/12/2011  . Zoster 09/18/2008    Health Maintenance  Topic Date Due  . DEXA SCAN  12/17/2003  . TETANUS/TDAP  09/11/2021  . INFLUENZA VACCINE  Completed  . PNA vac Low Risk Adult  Completed     Discussed health benefits of physical activity, and encouraged her to engage in regular exercise appropriate for her age and condition.    ------------------------------------------------------------------------------------------------------------ Problem List Items Addressed This Visit      Other   Blood pressure elevated without history of HTN    Suspect white coat hypertension Patient reports it is well controlled 120s/70-80s at home Continue to monitor      Relevant Orders   Comprehensive metabolic panel   CBC w/Diff/Platelet   Hypercholesteremia    Recheck lipid panel and CMP Not currently on medications Calculate  ASCVD risk and determine if statin may be indicated Ok to d/c ASA for primary prevention given new changes in medical literature  Relevant Orders   Comprehensive metabolic panel   Lipid panel    Other Visit Diagnoses    Medicare annual wellness visit, subsequent    -  Primary   Relevant Orders   CBC w/Diff/Platelet   Bilateral impacted cerumen       Relevant Orders   Ambulatory referral to ENT      CMA attempted ear lavage, but unable to remove more than small bits of cerumen.  Patient reports decreased hearing after procedure.  Referral to ENT placed.   Return in about 1 year (around 03/22/2019) for AWV/CPE.   The entirety of the information documented in the History of Present Illness, Review of Systems and Physical Exam were personally obtained by me. Portions of this information were initially documented by Irving Burton Ratchford, CMA and reviewed by me for thoroughness and accuracy.    Erasmo Downer, MD, MPH [facility] 03/21/2018 10:14 AM

## 2018-03-21 NOTE — Patient Instructions (Signed)
Preventive Care 80 Years and Older, Female Preventive care refers to lifestyle choices and visits with your health care provider that can promote health and wellness. What does preventive care include?  A yearly physical exam. This is also called an annual well check.  Dental exams once or twice a year.  Routine eye exams. Ask your health care provider how often you should have your eyes checked.  Personal lifestyle choices, including: ? Daily care of your teeth and gums. ? Regular physical activity. ? Eating a healthy diet. ? Avoiding tobacco and drug use. ? Limiting alcohol use. ? Practicing safe sex. ? Taking low-dose aspirin every day. ? Taking vitamin and mineral supplements as recommended by your health care provider. What happens during an annual well check? The services and screenings done by your health care provider during your annual well check will depend on your age, overall health, lifestyle risk factors, and family history of disease. Counseling Your health care provider may ask you questions about your:  Alcohol use.  Tobacco use.  Drug use.  Emotional well-being.  Home and relationship well-being.  Sexual activity.  Eating habits.  History of falls.  Memory and ability to understand (cognition).  Work and work environment.  Reproductive health.  Screening You may have the following tests or measurements:  Height, weight, and BMI.  Blood pressure.  Lipid and cholesterol levels. These may be checked every 5 years, or more frequently if you are over 80 years old.  Skin check.  Lung cancer screening. You may have this screening every year starting at age 80 if you have a 30-pack-year history of smoking and currently smoke or have quit within the past 15 years.  Fecal occult blood test (FOBT) of the stool. You may have this test every year starting at age 80.  Flexible sigmoidoscopy or colonoscopy. You may have a sigmoidoscopy every 5 years or  a colonoscopy every 10 years starting at age 80.  Hepatitis C blood test.  Hepatitis B blood test.  Sexually transmitted disease (STD) testing.  Diabetes screening. This is done by checking your blood sugar (glucose) after you have not eaten for a while (fasting). You may have this done every 1-3 years.  Bone density scan. This is done to screen for osteoporosis. You may have this done starting at age 80.  Mammogram. This may be done every 1-2 years. Talk to your health care provider about how often you should have regular mammograms.  Talk with your health care provider about your test results, treatment options, and if necessary, the need for more tests. Vaccines Your health care provider may recommend certain vaccines, such as:  Influenza vaccine. This is recommended every year.  Tetanus, diphtheria, and acellular pertussis (Tdap, Td) vaccine. You may need a Td booster every 10 years.  Varicella vaccine. You may need this if you have not been vaccinated.  Zoster vaccine. You may need this after age 80.  Measles, mumps, and rubella (MMR) vaccine. You may need at least one dose of MMR if you were born in 1957 or later. You may also need a second dose.  Pneumococcal 13-valent conjugate (PCV13) vaccine. One dose is recommended after age 80.  Pneumococcal polysaccharide (PPSV23) vaccine. One dose is recommended after age 80.  Meningococcal vaccine. You may need this if you have certain conditions.  Hepatitis A vaccine. You may need this if you have certain conditions or if you travel or work in places where you may be exposed to hepatitis   A.  Hepatitis B vaccine. You may need this if you have certain conditions or if you travel or work in places where you may be exposed to hepatitis B.  Haemophilus influenzae type b (Hib) vaccine. You may need this if you have certain conditions.  Talk to your health care provider about which screenings and vaccines you need and how often you  need them. This information is not intended to replace advice given to you by your health care provider. Make sure you discuss any questions you have with your health care provider. Document Released: 01/06/2016 Document Revised: 08/29/2016 Document Reviewed: 10/11/2015 Elsevier Interactive Patient Education  2018 Elsevier Inc.  

## 2018-03-25 DIAGNOSIS — R030 Elevated blood-pressure reading, without diagnosis of hypertension: Secondary | ICD-10-CM | POA: Diagnosis not present

## 2018-03-25 DIAGNOSIS — Z0000 Encounter for general adult medical examination without abnormal findings: Secondary | ICD-10-CM | POA: Diagnosis not present

## 2018-03-25 DIAGNOSIS — E7800 Pure hypercholesterolemia, unspecified: Secondary | ICD-10-CM | POA: Diagnosis not present

## 2018-03-26 ENCOUNTER — Telehealth: Payer: Self-pay

## 2018-03-26 LAB — COMPREHENSIVE METABOLIC PANEL
ALBUMIN: 4.5 g/dL (ref 3.5–4.8)
ALT: 15 IU/L (ref 0–32)
AST: 27 IU/L (ref 0–40)
Albumin/Globulin Ratio: 1.7 (ref 1.2–2.2)
Alkaline Phosphatase: 89 IU/L (ref 39–117)
BUN / CREAT RATIO: 18 (ref 12–28)
BUN: 14 mg/dL (ref 8–27)
Bilirubin Total: 0.5 mg/dL (ref 0.0–1.2)
CALCIUM: 9.6 mg/dL (ref 8.7–10.3)
CO2: 24 mmol/L (ref 20–29)
Chloride: 101 mmol/L (ref 96–106)
Creatinine, Ser: 0.8 mg/dL (ref 0.57–1.00)
GFR calc Af Amer: 81 mL/min/{1.73_m2} (ref >59)
GFR calc non Af Amer: 70 mL/min/{1.73_m2} (ref >59)
GLUCOSE: 88 mg/dL (ref 65–99)
Globulin, Total: 2.7 g/dL (ref 1.5–4.5)
Potassium: 4.2 mmol/L (ref 3.5–5.2)
Sodium: 141 mmol/L (ref 134–144)
TOTAL PROTEIN: 7.2 g/dL (ref 6.0–8.5)

## 2018-03-26 LAB — CBC WITH DIFFERENTIAL/PLATELET
Basophils Absolute: 0 10*3/uL (ref 0.0–0.2)
Basos: 0 %
EOS (ABSOLUTE): 0.1 10*3/uL (ref 0.0–0.4)
EOS: 3 %
HEMATOCRIT: 38.7 % (ref 34.0–46.6)
HEMOGLOBIN: 13.1 g/dL (ref 11.1–15.9)
Immature Grans (Abs): 0 10*3/uL (ref 0.0–0.1)
Immature Granulocytes: 0 %
LYMPHS ABS: 1.8 10*3/uL (ref 0.7–3.1)
Lymphs: 32 %
MCH: 30.8 pg (ref 26.6–33.0)
MCHC: 33.9 g/dL (ref 31.5–35.7)
MCV: 91 fL (ref 79–97)
Monocytes Absolute: 0.6 10*3/uL (ref 0.1–0.9)
Monocytes: 11 %
NEUTROS ABS: 3 10*3/uL (ref 1.4–7.0)
Neutrophils: 54 %
Platelets: 314 10*3/uL (ref 150–379)
RBC: 4.26 x10E6/uL (ref 3.77–5.28)
RDW: 13.3 % (ref 12.3–15.4)
WBC: 5.5 10*3/uL (ref 3.4–10.8)

## 2018-03-26 LAB — LIPID PANEL
CHOLESTEROL TOTAL: 247 mg/dL — AB (ref 100–199)
Chol/HDL Ratio: 2.7 ratio (ref 0.0–4.4)
HDL: 92 mg/dL (ref >39)
LDL Calculated: 144 mg/dL — ABNORMAL HIGH (ref 0–99)
Triglycerides: 55 mg/dL (ref 0–149)
VLDL CHOLESTEROL CAL: 11 mg/dL (ref 5–40)

## 2018-03-26 NOTE — Telephone Encounter (Signed)
-----   Message from Erasmo DownerAngela M Bacigalupo, MD sent at 03/26/2018  2:34 PM EDT ----- Normal Blood counts, kidney function, liver function, electrolytes.  Stable cholesterol.

## 2018-03-26 NOTE — Telephone Encounter (Signed)
Left message to call back  

## 2018-03-28 NOTE — Telephone Encounter (Signed)
Left detailed message on pt's vm. Okay per dpr.  

## 2018-03-28 NOTE — Telephone Encounter (Signed)
Pt returning call. Please call back.

## 2018-03-28 NOTE — Telephone Encounter (Signed)
LMTCB ED 

## 2018-04-18 ENCOUNTER — Telehealth: Payer: Self-pay

## 2018-04-18 ENCOUNTER — Ambulatory Visit
Admission: RE | Admit: 2018-04-18 | Discharge: 2018-04-18 | Disposition: A | Discharge status: Home / Self Care | Payer: Medicare Other | Source: Ambulatory Visit | Attending: Family Medicine | Admitting: Family Medicine

## 2018-04-18 DIAGNOSIS — Z1231 Encounter for screening mammogram for malignant neoplasm of breast: Secondary | ICD-10-CM

## 2018-04-18 NOTE — Telephone Encounter (Signed)
-----   Message from Erasmo DownerAngela M Bacigalupo, MD sent at 04/18/2018 11:36 AM EDT ----- Normal mammogram.  Repeat in 1 year.  Erasmo DownerBacigalupo, Angela M, MD, MPH [facility] 04/18/2018 11:36 AM

## 2018-04-18 NOTE — Telephone Encounter (Signed)
Pt advised.

## 2018-04-24 ENCOUNTER — Other Ambulatory Visit: Payer: Self-pay | Admitting: Physician Assistant

## 2018-04-29 ENCOUNTER — Other Ambulatory Visit: Payer: Self-pay | Admitting: Physician Assistant

## 2018-04-29 DIAGNOSIS — K219 Gastro-esophageal reflux disease without esophagitis: Secondary | ICD-10-CM

## 2018-07-29 DIAGNOSIS — H2513 Age-related nuclear cataract, bilateral: Secondary | ICD-10-CM | POA: Diagnosis not present

## 2018-09-10 DIAGNOSIS — Z23 Encounter for immunization: Secondary | ICD-10-CM | POA: Diagnosis not present

## 2018-10-08 ENCOUNTER — Ambulatory Visit: Payer: Medicare Other | Admitting: Family Medicine

## 2018-10-08 ENCOUNTER — Encounter: Payer: Self-pay | Admitting: Family Medicine

## 2018-10-08 VITALS — BP 144/78 | HR 70 | Temp 98.0°F | Wt 138.8 lb

## 2018-10-08 DIAGNOSIS — M940 Chondrocostal junction syndrome [Tietze]: Secondary | ICD-10-CM

## 2018-10-08 DIAGNOSIS — I809 Phlebitis and thrombophlebitis of unspecified site: Secondary | ICD-10-CM

## 2018-10-08 NOTE — Progress Notes (Signed)
Patient: Jacqueline Daniels Female    DOB: 04-22-1936   82 y.o.   MRN: [id number] Visit Date: 10/08/2018  Today's Provider: Shirlee Latch, MD   Chief Complaint  Patient presents with  . Abdominal Pain  . Leg feeling warm   Subjective:    HPI Patient presents today C/O right leg feeling warm to the touch on shin. She states the symptoms started approximately 2 weeks ago. She reports noticing some skin discoloration and varicose veins being more pronounced. Feels like a heating pad is being placed on the skin and taken back off.  Getting worse.   Patient also C/O left side abdominal discomfort. She reports the discomfort comes and goes. Symptoms started at the beginning of the year but are gradually worsening. The pain is at edge of ribcage in LUQ.  She states the symptoms are worse when she touches the area. She denies constipation. Noticed that after playing golf (had been 1 week since last playing) on Monday that it was more aggravated.     No Known Allergies   Current Outpatient Medications:  .  meloxicam (MOBIC) 15 MG tablet, TAKE ONE TABLET BY MOUTH EVERY DAY, Disp: 90 tablet, Rfl: 1 .  Multiple Vitamins-Minerals (MULTIVITAMIN WITH MINERALS) tablet, Take 1 tablet by mouth daily., Disp: , Rfl:  .  omeprazole (PRILOSEC) 20 MG capsule, TAKE 1 CAPSULE BY MOUTH DAILY, Disp: 90 capsule, Rfl: 1 .  fluticasone (FLONASE) 50 MCG/ACT nasal spray, Place 2 sprays into both nostrils daily. (Patient not taking: Reported on 10/08/2018), Disp: 16 g, Rfl: 6  Review of Systems  Constitutional: Negative.   Respiratory: Negative.   Cardiovascular: Negative.   Gastrointestinal: Abdominal pain: abdominal discomfort   Musculoskeletal: Negative.   Skin: Negative for color change, pallor, rash and wound.  Neurological: Negative.   Psychiatric/Behavioral: Negative.     Social History   Tobacco Use  . Smoking status: Former Smoker    Packs/day: 1.00    Years: 5.00    Pack years: 5.00      Last attempt to quit: 12/23/1973    Years since quitting: 44.8  . Smokeless tobacco: Never Used  Substance Use Topics  . Alcohol use: No   Objective:   BP (!) 144/78 (BP Location: Right Arm, Patient Position: Sitting, Cuff Size: Normal)   Pulse 70   Temp 98 F (36.7 C) (Oral)   Wt 138 lb 12.8 oz (63 kg)   SpO2 99%   BMI 25.39 kg/m    Physical Exam  Constitutional: She is oriented to person, place, and time. She appears well-developed and well-nourished. She does not appear ill. No distress.  HENT:  Head: Normocephalic and atraumatic.  Mouth/Throat: Oropharynx is clear and moist.  Eyes: Pupils are equal, round, and reactive to light. No scleral icterus.  Cardiovascular: Normal rate, regular rhythm, normal heart sounds and intact distal pulses.  No murmur heard. Pulmonary/Chest: Effort normal and breath sounds normal. No respiratory distress. She has no wheezes. She has no rales.  Abdominal: Soft. Normal appearance and bowel sounds are normal. She exhibits no distension and no ascites. There is no splenomegaly. There is no tenderness. There is no rigidity, no rebound, no guarding and no CVA tenderness.  TTP over L lower costal margin anteriorly  Neurological: She is alert and oriented to person, place, and time.  Skin: Skin is warm and dry. Capillary refill takes less than 2 seconds.  R anterior shin is warmer than rest of leg, no  erythema or TTP. Prominent veins in this area  Psychiatric: She has a normal mood and affect. Her behavior is normal.  Vitals reviewed.       Assessment & Plan:   1. Costochondritis - new problem -Patient has tenderness to palpation over left lower costal margin that reproduces pain -Her abdomen is soft and nontender and has a benign exam - Suspect that golf swing has aggravated area and given costochondritis -Discussed rest, ice, anti-inflammatories -Discussed return precautions  2. Superficial phlebitis -New problem - Veins are prominent  and warm to the touch -This is likely consistent with superficial phlebitis -Discussed compression stockings and elevation - Reassurance given that there are no signs of DVT or cellulitis    Return if symptoms worsen or fail to improve.   Approximately 25 minutes was spent in discussion of which greater than 50% was consultation.    The entirety of the information documented in the History of Present Illness, Review of Systems and Physical Exam were personally obtained by me. Portions of this information were initially documented by Presley Raddle, CMA and reviewed by me for thoroughness and accuracy.    Erasmo Downer, MD, MPH [facility] 10/08/2018 11:49 AM

## 2018-10-08 NOTE — Patient Instructions (Signed)
Costochondritis Costochondritis is swelling and irritation (inflammation) of the tissue (cartilage) that connects your ribs to your breastbone (sternum). This causes pain in the front of your chest. The pain usually starts gradually and involves more than one rib. What are the causes? The exact cause of this condition is not always known. It results from stress on the cartilage where your ribs attach to your sternum. The cause of this stress could be:  Chest injury (trauma).  Exercise or activity, such as lifting.  Severe coughing.  What increases the risk? You may be at higher risk for this condition if you:  Are female.  Are 30?80 years old.  Recently started a new exercise or work activity.  Have low levels of vitamin D.  Have a condition that makes you cough frequently.  What are the signs or symptoms? The main symptom of this condition is chest pain. The pain:  Usually starts gradually and can be sharp or dull.  Gets worse with deep breathing, coughing, or exercise.  Gets better with rest.  May be worse when you press on the sternum-rib connection (tenderness).  How is this diagnosed? This condition is diagnosed based on your symptoms, medical history, and a physical exam. Your health care provider will check for tenderness when pressing on your sternum. This is the most important finding. You may also have tests to rule out other causes of chest pain. These may include:  A chest X-ray to check for lung problems.  An electrocardiogram (ECG) to see if you have a heart problem that could be causing the pain.  An imaging scan to rule out a chest or rib fracture.  How is this treated? This condition usually goes away on its own over time. Your health care provider may prescribe an NSAID to reduce pain and inflammation. Your health care provider may also suggest that you:  Rest and avoid activities that make pain worse.  Apply heat or cold to the area to reduce pain  and inflammation.  Do exercises to stretch your chest muscles.  If these treatments do not help, your health care provider may inject a numbing medicine at the sternum-rib connection to help relieve the pain. Follow these instructions at home:  Avoid activities that make pain worse. This includes any activities that use chest, abdominal, and side muscles.  If directed, put ice on the painful area: ? Put ice in a plastic bag. ? Place a towel between your skin and the bag. ? Leave the ice on for 20 minutes, 2-3 times a day.  If directed, apply heat to the affected area as often as told by your health care provider. Use the heat source that your health care provider recommends, such as a moist heat pack or a heating pad. ? Place a towel between your skin and the heat source. ? Leave the heat on for 20-30 minutes. ? Remove the heat if your skin turns bright red. This is especially important if you are unable to feel pain, heat, or cold. You may have a greater risk of getting burned.  Take over-the-counter and prescription medicines only as told by your health care provider.  Return to your normal activities as told by your health care provider. Ask your health care provider what activities are safe for you.  Keep all follow-up visits as told by your health care provider. This is important. Contact a health care provider if:  You have chills or a fever.  Your pain does not go   away or it gets worse.  You have a cough that does not go away (is persistent). Get help right away if:  You have shortness of breath. This information is not intended to replace advice given to you by your health care provider. Make sure you discuss any questions you have with your health care provider. Document Released: 09/19/2005 Document Revised: 06/29/2016 Document Reviewed: 04/04/2016 Elsevier Interactive Patient Education  2018 [employer]   

## 2018-11-24 DIAGNOSIS — L570 Actinic keratosis: Secondary | ICD-10-CM | POA: Diagnosis not present

## 2018-11-24 DIAGNOSIS — L578 Other skin changes due to chronic exposure to nonionizing radiation: Secondary | ICD-10-CM | POA: Diagnosis not present

## 2018-11-24 DIAGNOSIS — L821 Other seborrheic keratosis: Secondary | ICD-10-CM | POA: Diagnosis not present

## 2019-01-28 ENCOUNTER — Other Ambulatory Visit: Payer: Self-pay | Admitting: Physician Assistant

## 2019-01-28 ENCOUNTER — Other Ambulatory Visit: Payer: Self-pay | Admitting: Family Medicine

## 2019-01-28 DIAGNOSIS — K219 Gastro-esophageal reflux disease without esophagitis: Secondary | ICD-10-CM

## 2019-02-09 ENCOUNTER — Telehealth: Payer: Self-pay | Admitting: Family Medicine

## 2019-02-09 NOTE — Telephone Encounter (Signed)
° °  omeprazole (PRILOSEC) 20 MG capsule Pt has stopped taking this.  Pt has stopped for 3 or 4 days.   Eating a lot of yogurt.  Pt has looked into having stomach testing done for heartburn related.  Hpylori - procedure  Please call pt back to discuss.  Thanks, [employer]

## 2019-02-10 NOTE — Telephone Encounter (Signed)
We can test for H pylori in the clinic, but you need to be off of Omeprazole for 2 weeks for [place] to do this.  We could set her up with an appt after she has been off for 2 weeks and discuss further and test her.

## 2019-02-11 NOTE — Telephone Encounter (Signed)
Patient was advised and states she will call back and schedule appointment once she has been off of the omeprazole for 2 weeks.

## 2019-02-27 ENCOUNTER — Telehealth: Payer: Self-pay

## 2019-02-27 DIAGNOSIS — R1013 Epigastric pain: Secondary | ICD-10-CM

## 2019-02-27 DIAGNOSIS — K219 Gastro-esophageal reflux disease without esophagitis: Secondary | ICD-10-CM

## 2019-02-27 NOTE — Telephone Encounter (Signed)
Lab slip printed at the front desk for pickup. Patient advised.  

## 2019-02-27 NOTE — Telephone Encounter (Signed)
OK to order H pylori

## 2019-02-27 NOTE — Addendum Note (Signed)
Addended by: Sherre Poot on: 02/27/2019 04:56 PM   Modules accepted: Orders

## 2019-02-27 NOTE — Telephone Encounter (Signed)
Called pt to schedule AWV and she requested the apt and CPE be on the same day (03/24/19). Only availability I had same day prior to CPE was at 840. Pt agreed to that apt time and stated she was ok waiting between apts. Also pt stated she has been off her omeprazole for 2 weeks and is ready for her procedure. Checked pt chart and saw that this was recommend so she could be tested for H pylori. Please advise. Thanks, MM

## 2019-03-03 DIAGNOSIS — K219 Gastro-esophageal reflux disease without esophagitis: Secondary | ICD-10-CM | POA: Diagnosis not present

## 2019-03-03 DIAGNOSIS — R1013 Epigastric pain: Secondary | ICD-10-CM | POA: Diagnosis not present

## 2019-03-04 ENCOUNTER — Telehealth: Payer: Self-pay

## 2019-03-04 LAB — H. PYLORI BREATH TEST: H pylori Breath Test: NEGATIVE

## 2019-03-04 NOTE — Telephone Encounter (Signed)
Left patient a message advising her that labs were normal.

## 2019-03-04 NOTE — Telephone Encounter (Signed)
-----   Message from Erasmo Downer, MD sent at 03/04/2019  4:41 PM EDT ----- Negative for H Pylori

## 2019-03-05 ENCOUNTER — Encounter: Payer: Self-pay | Admitting: Family Medicine

## 2019-03-05 ENCOUNTER — Ambulatory Visit (INDEPENDENT_AMBULATORY_CARE_PROVIDER_SITE_OTHER): Payer: Medicare Other | Admitting: Family Medicine

## 2019-03-05 ENCOUNTER — Other Ambulatory Visit: Payer: Self-pay

## 2019-03-05 VITALS — BP 118/78 | HR 83 | Temp 98.0°F | Resp 16 | Wt 134.8 lb

## 2019-03-05 DIAGNOSIS — M25561 Pain in right knee: Secondary | ICD-10-CM

## 2019-03-05 DIAGNOSIS — J309 Allergic rhinitis, unspecified: Secondary | ICD-10-CM

## 2019-03-05 DIAGNOSIS — G8929 Other chronic pain: Secondary | ICD-10-CM

## 2019-03-05 DIAGNOSIS — H6983 Other specified disorders of Eustachian tube, bilateral: Secondary | ICD-10-CM | POA: Diagnosis not present

## 2019-03-05 DIAGNOSIS — H9193 Unspecified hearing loss, bilateral: Secondary | ICD-10-CM | POA: Diagnosis not present

## 2019-03-05 DIAGNOSIS — M7552 Bursitis of left shoulder: Secondary | ICD-10-CM | POA: Diagnosis not present

## 2019-03-05 DIAGNOSIS — M25562 Pain in left knee: Secondary | ICD-10-CM

## 2019-03-05 DIAGNOSIS — K219 Gastro-esophageal reflux disease without esophagitis: Secondary | ICD-10-CM

## 2019-03-05 DIAGNOSIS — H6123 Impacted cerumen, bilateral: Secondary | ICD-10-CM

## 2019-03-05 MED ORDER — LIDOCAINE HCL (PF) 1 % IJ SOLN
2.0000 mL | Freq: Once | INTRAMUSCULAR | Status: AC
  Administered 2019-03-05: 2 mL via INTRADERMAL

## 2019-03-05 MED ORDER — METHYLPREDNISOLONE ACETATE 40 MG/ML IJ SUSP
40.0000 mg | Freq: Once | INTRAMUSCULAR | Status: AC
  Administered 2019-03-05: 40 mg

## 2019-03-05 MED ORDER — LIDOCAINE HCL (PF) 1 % IJ SOLN
2.0000 mL | Freq: Once | INTRAMUSCULAR | Status: AC
  Administered 2019-03-05: 2 mL

## 2019-03-05 NOTE — Progress Notes (Signed)
Patient: Jacqueline Daniels Female    DOB: 1936-01-29   83 y.o.   MRN: [id number] Visit Date: 03/09/2019  Today's Provider: Shirlee Latch, MD   Chief Complaint  Patient presents with  . Shoulder Pain   Subjective:     HPI  Patient here today with c/o left shoulder pain off and on for the past few years. Reports that she had  Rotator cuff torn a few but she is able to move her shoulder well. Patient is requesting xray.  Patient is also concerned about bilateral knee pain.  She is status post bilateral knee replacement several years ago.  She started having more pain medially in both knees.  She states this does not affect her function, but wonders if there is anything that needs to be done  Patient is also concerned about hearing loss that she feels may be related to cerumen impaction.  She has had to have this removed before and this is caused her some hearing loss  Patient stopped taking omeprazole in order to have an H. pylori test.  This was negative.  She states that she has been watching what she eats and has not felt the need to resume any medication for her GERD.  It is well controlled now with diet.  Patient is also concerned about ongoing nasal congestion and a sensation of popping in her ears bilaterally.  She is not currently taking anything for this.   No Known Allergies   Current Outpatient Medications:  .  meloxicam (MOBIC) 15 MG tablet, TAKE ONE TABLET BY MOUTH EVERY DAY, Disp: 90 tablet, Rfl: 1 .  omeprazole (PRILOSEC) 20 MG capsule, TAKE 1 CAPSULE BY MOUTH ONCE DAILY, Disp: 90 capsule, Rfl: 1 .  fluticasone (FLONASE) 50 MCG/ACT nasal spray, Place 2 sprays into both nostrils daily. (Patient not taking: Reported on 10/08/2018), Disp: 16 g, Rfl: 6 .  Multiple Vitamins-Minerals (MULTIVITAMIN WITH MINERALS) tablet, Take 1 tablet by mouth daily., Disp: , Rfl:   Review of Systems  Constitutional: Negative.   HENT: Positive for congestion, ear pain, postnasal drip,  rhinorrhea and sneezing.   Respiratory: Negative.   Cardiovascular: Negative.   Gastrointestinal: Negative.   Genitourinary: Negative.   Musculoskeletal: Positive for arthralgias and myalgias. Negative for joint swelling.    Social History   Tobacco Use  . Smoking status: Former Smoker    Packs/day: 1.00    Years: 5.00    Pack years: 5.00    Last attempt to quit: 12/23/1973    Years since quitting: 45.2  . Smokeless tobacco: Never Used  Substance Use Topics  . Alcohol use: No      Objective:   BP 118/78 (BP Location: Left Arm, Patient Position: Sitting, Cuff Size: Large)   Pulse 83   Temp 98 F (36.7 C) (Oral)   Resp 16   Wt 134 lb 12.8 oz (61.1 kg)   BMI 24.66 kg/m  Vitals:   03/05/19 0837  BP: 118/78  Pulse: 83  Resp: 16  Temp: 98 F (36.7 C)  TempSrc: Oral  Weight: 134 lb 12.8 oz (61.1 kg)     Physical Exam Vitals signs reviewed.  Constitutional:      General: She is not in acute distress.    Appearance: Normal appearance. She is well-developed. She is not diaphoretic.  HENT:     Head: Normocephalic and atraumatic.     Right Ear: External ear normal. There is impacted cerumen.     Left  Ear: External ear normal. There is impacted cerumen.     Nose: Congestion present.     Right Turbinates: Enlarged.     Left Turbinates: Enlarged.     Right Sinus: No maxillary sinus tenderness or frontal sinus tenderness.     Left Sinus: No maxillary sinus tenderness or frontal sinus tenderness.     Mouth/Throat:     Mouth: Mucous membranes are moist.     Pharynx: Oropharynx is clear.  Eyes:     General: No scleral icterus.    Conjunctiva/sclera: Conjunctivae normal.  Neck:     Musculoskeletal: Neck supple.     Thyroid: No thyromegaly.  Cardiovascular:     Rate and Rhythm: Normal rate and regular rhythm.     Pulses: Normal pulses.     Heart sounds: Normal heart sounds. No murmur.  Pulmonary:     Effort: Pulmonary effort is normal. No respiratory distress.      Breath sounds: Normal breath sounds. No wheezing, rhonchi or rales.  Musculoskeletal:     Right lower leg: No edema.     Left lower leg: No edema.     Comments: Bilateral knees: Range of motion is intact.  Strength intact.  No joint line tenderness to palpation Left shoulder: Range of motion is intact.  Some pain with overhead motion.  Rotator cuff strength intact except for 4+ out of 5 strength of external rotation.  Positive Hawkins and empty can testing  Lymphadenopathy:     Cervical: No cervical adenopathy.  Skin:    General: Skin is warm and dry.     Capillary Refill: Capillary refill takes less than 2 seconds.     Findings: No rash.  Neurological:     Mental Status: She is alert and oriented to person, place, and time. Mental status is at baseline.  Psychiatric:        Mood and Affect: Mood normal.        Behavior: Behavior normal.         Assessment & Plan   1. Bursitis of left shoulder -Patient does have slight rotator cuff weakness this is likely related to her partial tear that she had previously -Her current pain seems to be related to bursitis that is confirmed on exam with impingement testing -Discussed treatment options and patient wishes to proceed with corticosteroid injection -See procedure note below -Also discussed home exercise program - methylPREDNISolone acetate (DEPO-MEDROL) injection 40 mg - lidocaine (PF) (XYLOCAINE) 1 % injection 2 mL - lidocaine (PF) (XYLOCAINE) 1 % injection 2 mL  2. Dysfunction of both eustachian tubes -Patient describes eustachian tube dysfunction with the popping in her ears -Discussed this is likely related to her allergic rhinitis and nasal congestion -Discussed use of Flonase and OTC antihistamine  3. Chronic pain of both knees -Ongoing problem -Unclear etiology as exam is benign and patient is status post bilateral knee replacement -Offered referral to orthopedist, but patient declines at this time as it is not impacting  her function  4. Bilateral hearing loss, unspecified hearing loss type -New problem -Likely related to cerumen impaction -Referral to ENT as below  5. Bilateral impacted cerumen -Recurrent problem -Attempted removal with solution in the office, but unable to remove it -Referral to ENT for suction removal  6. Gastro-esophageal reflux disease without esophagitis -Chronic and stable -Now off of her PPI - H. pylori negative -Discussed dietary changes and lifestyle management  7. Allergic rhinitis, unspecified seasonality, unspecified trigger -Chronic and worsening within the last week -  Discussed OTC antihistamine and Flonase use -Discussed lifestyle changes    INJECTION: Patient was given informed consent,. Appropriate time out was taken. Area prepped and draped in usual sterile fashion. 1 cc of depo-medrol 40 mg/ml plus  4 cc of 1% lidocaine without epinephrine was injected into the left shoulder subacromial region using a(n) posterior approach. The patient tolerated the procedure well. There were no complications. Post procedure instructions were given.   Meds ordered this encounter  Medications  . methylPREDNISolone acetate (DEPO-MEDROL) injection 40 mg  . lidocaine (PF) (XYLOCAINE) 1 % injection 2 mL  . lidocaine (PF) (XYLOCAINE) 1 % injection 2 mL     Return if symptoms worsen or fail to improve.   The entirety of the information documented in the History of Present Illness, Review of Systems and Physical Exam were personally obtained by me. Portions of this information were initially documented by Hetty ElyJoseline Rosas, CMA and reviewed by me for thoroughness and accuracy.    Erasmo DownerBacigalupo, Magdalynn Davilla M, MD, MPH [facility] 03/09/2019 1:23 PM

## 2019-03-09 ENCOUNTER — Encounter: Payer: Self-pay | Admitting: Family Medicine

## 2019-03-09 DIAGNOSIS — H6123 Impacted cerumen, bilateral: Secondary | ICD-10-CM | POA: Diagnosis not present

## 2019-03-09 DIAGNOSIS — H903 Sensorineural hearing loss, bilateral: Secondary | ICD-10-CM | POA: Diagnosis not present

## 2019-03-12 ENCOUNTER — Telehealth: Payer: Self-pay | Admitting: Family Medicine

## 2019-03-12 NOTE — Telephone Encounter (Signed)
Spoke with patient and she reschedule her appointment with just Dr.B.

## 2019-03-12 NOTE — Telephone Encounter (Signed)
Pt returned missed call. Please leave a message if she cannot answer.  Thanks, [employer]

## 2019-03-24 ENCOUNTER — Encounter: Payer: Medicare Other | Admitting: Family Medicine

## 2019-03-24 ENCOUNTER — Ambulatory Visit: Payer: Medicare Other

## 2019-04-17 ENCOUNTER — Ambulatory Visit: Payer: Self-pay | Admitting: Family Medicine

## 2019-04-22 ENCOUNTER — Telehealth: Payer: Self-pay | Admitting: *Deleted

## 2019-04-22 NOTE — Telephone Encounter (Signed)
Patient called office concerning her left shoulder pain. Patient is requesting an x-ray of her shoulder and/or an ortho referral. Patient states pain has gotten so bad it's keeping her up at night. LOV 03/05/2019. Please advise?

## 2019-04-23 NOTE — Telephone Encounter (Signed)
We can set her up for evisit to re-evaluate (it's been 6 wks since last eval) and determine need and order if indicated Xray and referral.

## 2019-04-23 NOTE — Telephone Encounter (Signed)
Patient declined e-visit she wanted to be seen in the office. Patient scheduled with Antony Contras on 04/27/2019

## 2019-04-27 ENCOUNTER — Ambulatory Visit (INDEPENDENT_AMBULATORY_CARE_PROVIDER_SITE_OTHER): Payer: Medicare Other | Admitting: Physician Assistant

## 2019-04-27 ENCOUNTER — Other Ambulatory Visit: Payer: Self-pay

## 2019-04-27 ENCOUNTER — Encounter: Payer: Self-pay | Admitting: Physician Assistant

## 2019-04-27 VITALS — BP 188/93 | HR 69 | Temp 97.9°F | Resp 16 | Wt 133.0 lb

## 2019-04-27 DIAGNOSIS — M12812 Other specific arthropathies, not elsewhere classified, left shoulder: Secondary | ICD-10-CM

## 2019-04-27 DIAGNOSIS — M75102 Unspecified rotator cuff tear or rupture of left shoulder, not specified as traumatic: Secondary | ICD-10-CM | POA: Diagnosis not present

## 2019-04-27 DIAGNOSIS — M19012 Primary osteoarthritis, left shoulder: Secondary | ICD-10-CM | POA: Diagnosis not present

## 2019-04-27 DIAGNOSIS — M7542 Impingement syndrome of left shoulder: Secondary | ICD-10-CM

## 2019-04-27 NOTE — Progress Notes (Signed)
Patient: Jacqueline Daniels Female    DOB: 1936-10-03   83 y.o.   MRN: [id number] Visit Date: 04/27/2019  Today's Provider: Margaretann Loveless, PA-C   Chief Complaint  Patient presents with  . Shoulder Pain   Subjective:     HPI   Bursitis of left shoulder From 03/05/2019-Discussed treatment options and patient wishes to proceed with corticosteroid injection. Also discussed home exercise program.  Patient presents today for worsening left shoulder pain. She reports she is still able to move her shoulder freely and has been told she had a "small" tear in her rotator cuff and complete tear of the long head of the biceps on the left in 2016. At that time she decided to not pursue any other treatment due to not having pain or limited ROM. She still continues to be active. She is gardening and plays golf 2-3 times per week. After certain activities the shoulder pain is more intense. She takes meloxicam 15mg  daily for arthralgias. She also will take tyelnol prn for more intense/breakthrough pain. She has not done any PT recently. Had a steroid injection for subacromial bursitis in March. States injection did not work longer than a week.   MRI from 2016 was reviewed and pulled into note as below:   CLINICAL DATA:  Three-month history of left shoulder pain and limited range of motion.  EXAM: MRI OF THE LEFT SHOULDER WITHOUT CONTRAST  TECHNIQUE: Multiplanar, multisequence MR imaging of the shoulder was performed. No intravenous contrast was administered.  COMPARISON:  None.  FINDINGS: Exam limited by patient motion.  Rotator cuff: Full-thickness retracted supraspinatus tendon tear with estimated 2 cm of retraction. The tear is approximately 16 mm wide. The infraspinatus tendon demonstrates significant tendinopathy and probable interstitial tears. The subscapularis tendon is grossly intact. There is a small focal articular surface tear measuring 4.5 mm.  Muscles: Severe  and diffuse fatty atrophy of the supraspinatus muscle  Biceps long head:  Not visualized.  Likely torn and retracted.  Acromioclavicular Joint: Advanced degenerative changes. The acromion is type 1 in shape. Mild lateral downsloping and undersurface spurring may contribute to bony impingement.  Glenohumeral Joint: Moderate degenerative changes. Small joint effusion and mild synovitis.  Labrum: Difficult to evaluate because of patient motion but suspect degenerated and torn superior labrum.  Bones:  No acute bony findings.  IMPRESSION: 1. Full-thickness retracted supraspinatus tendon tear with marked fatty atrophy of the supraspinatus muscle. 2. Significant infraspinatus tendinopathy. 3. Small articular surface tear involving the subscapularis tendon. 4. Torn and retracted long head biceps tendon. 5. Degenerated and torn superior labrum. 6. AC joint degenerative changes and lateral downsloping of the acromion possibly contributing to bony impingement. 7. Glenohumeral joint degenerative changes.   Electronically Signed   By: Rudie Meyer M.D.   On: 04/28/2015 10:31  No Known Allergies   Current Outpatient Medications:  .  meloxicam (MOBIC) 15 MG tablet, TAKE ONE TABLET BY MOUTH EVERY DAY, Disp: 90 tablet, Rfl: 1 .  Multiple Vitamins-Minerals (MULTIVITAMIN WITH MINERALS) tablet, Take 1 tablet by mouth daily., Disp: , Rfl:  .  fluticasone (FLONASE) 50 MCG/ACT nasal spray, Place 2 sprays into both nostrils daily. (Patient not taking: Reported on 10/08/2018), Disp: 16 g, Rfl: 6 .  omeprazole (PRILOSEC) 20 MG capsule, TAKE 1 CAPSULE BY MOUTH ONCE DAILY (Patient not taking: Reported on 04/27/2019), Disp: 90 capsule, Rfl: 1  Review of Systems  Constitutional: Negative for appetite change, chills, fatigue and fever.  Respiratory:  Negative for chest tightness and shortness of breath.   Cardiovascular: Negative for chest pain and palpitations.  Gastrointestinal: Negative for  abdominal pain, nausea and vomiting.  Musculoskeletal: Positive for arthralgias. Negative for joint swelling and myalgias.  Neurological: Negative for dizziness and weakness.    Social History   Tobacco Use  . Smoking status: Former Smoker    Packs/day: 1.00    Years: 5.00    Pack years: 5.00    Last attempt to quit: 12/23/1973    Years since quitting: 45.3  . Smokeless tobacco: Never Used  Substance Use Topics  . Alcohol use: No      Objective:   BP (!) 182/96 (BP Location: Left Arm, Patient Position: Sitting, Cuff Size: Large)   Pulse 69   Temp 97.9 F (36.6 C) (Oral)   Resp 16   Wt 133 lb (60.3 kg)   SpO2 97%   BMI 24.33 kg/m  Vitals:   04/27/19 1009  BP: (!) 182/96  Pulse: 69  Resp: 16  Temp: 97.9 F (36.6 C)  TempSrc: Oral  SpO2: 97%  Weight: 133 lb (60.3 kg)     Physical Exam Vitals signs reviewed.  Constitutional:      General: She is not in acute distress.    Appearance: Normal appearance. She is well-developed and normal weight. She is not ill-appearing or diaphoretic.  HENT:     Head: Normocephalic and atraumatic.  Neck:     Musculoskeletal: Normal range of motion and neck supple.     Thyroid: No thyromegaly.     Vascular: No JVD.     Trachea: No tracheal deviation.  Cardiovascular:     Rate and Rhythm: Normal rate and regular rhythm.     Heart sounds: Normal heart sounds. No murmur. No friction rub. No gallop.   Pulmonary:     Effort: Pulmonary effort is normal. No respiratory distress.     Breath sounds: Normal breath sounds. No wheezing or rales.  Musculoskeletal:     Left shoulder: She exhibits tenderness and decreased strength. She exhibits normal range of motion, no bony tenderness, no swelling, no effusion, no crepitus, no deformity, no laceration, no pain, no spasm and normal pulse.     Comments: There is some decreased strength (4/5) with abd, IR and ER at 90 degree abd.  Positive Hawkin's Impingement test. Positive Empty can  (ilicits pain) Negative drop arm test  Lymphadenopathy:     Cervical: No cervical adenopathy.  Neurological:     Mental Status: She is alert.         Assessment & Plan    1. Rotator cuff tear arthropathy, left Discussed previous MRI results from 2016 that showed old complete tear of the supraspinatous muscle, significant tendinopathy and possible partial tears of the infraspinatous muscle, complete tear of the long head of the biceps muscle, possible labral tear, small articular surface tear at the subscapularis insertion, advanced degenerative changes of the [facility] joint and glenohumeral joint as well as a down-sloping acromion with bone spurs causing subacromial impingement. I discussed that with all that damage that is over 58 years old and having not done anything about it at that time, that the arthriits in the shoulder joint has probably progressed greatly. I did offer a referral to orthopedics, but patient declines at this time, stating "I do not want to lose my summer. I will call back in the fall or winter and may consider it then. If I become unbearable I may call sooner."  She wants to continue taking meloxicam and will use tylenol prn. Discussed using tumeric 500-1000mg  daily for inflammation as well. I also discussed icing her shoulder after intense activity, like after she plays golf, for 20 minutes to calm down inflammatory pain (as this is when it bothers her most). She will also continue home exercises she has. Call if worsening.   2. Arthritis of shoulder region, left See above medical treatment plan.  3. Impingement syndrome of left shoulder See above medical treatment plan.     Margaretann LovelessJennifer M , PA-C  [facility]

## 2019-04-29 ENCOUNTER — Encounter: Payer: Self-pay | Admitting: Physician Assistant

## 2019-05-19 DIAGNOSIS — M75122 Complete rotator cuff tear or rupture of left shoulder, not specified as traumatic: Secondary | ICD-10-CM | POA: Diagnosis not present

## 2019-05-19 DIAGNOSIS — M7542 Impingement syndrome of left shoulder: Secondary | ICD-10-CM | POA: Diagnosis not present

## 2019-05-19 DIAGNOSIS — M542 Cervicalgia: Secondary | ICD-10-CM | POA: Diagnosis not present

## 2019-06-12 DIAGNOSIS — M5136 Other intervertebral disc degeneration, lumbar region: Secondary | ICD-10-CM | POA: Diagnosis not present

## 2019-06-12 DIAGNOSIS — M5416 Radiculopathy, lumbar region: Secondary | ICD-10-CM | POA: Diagnosis not present

## 2019-07-09 DIAGNOSIS — M5136 Other intervertebral disc degeneration, lumbar region: Secondary | ICD-10-CM | POA: Diagnosis not present

## 2019-07-09 DIAGNOSIS — M5416 Radiculopathy, lumbar region: Secondary | ICD-10-CM | POA: Diagnosis not present

## 2019-07-18 ENCOUNTER — Other Ambulatory Visit: Payer: Self-pay | Admitting: Physician Assistant

## 2019-07-18 DIAGNOSIS — K219 Gastro-esophageal reflux disease without esophagitis: Secondary | ICD-10-CM

## 2019-08-17 NOTE — Progress Notes (Signed)
Duplicate/error

## 2019-08-18 ENCOUNTER — Ambulatory Visit (INDEPENDENT_AMBULATORY_CARE_PROVIDER_SITE_OTHER): Payer: Medicare Other

## 2019-08-18 ENCOUNTER — Other Ambulatory Visit: Payer: Self-pay

## 2019-08-18 VITALS — BP 142/68 | HR 70 | Temp 98.7°F | Ht 62.0 in | Wt 132.2 lb

## 2019-08-18 DIAGNOSIS — Z23 Encounter for immunization: Secondary | ICD-10-CM | POA: Diagnosis not present

## 2019-08-18 DIAGNOSIS — Z0000 Encounter for general adult medical examination without abnormal findings: Secondary | ICD-10-CM | POA: Diagnosis not present

## 2019-08-18 NOTE — Progress Notes (Addendum)
-  Subjective:   Jacqueline Daniels is a 83 y.o. female who presents for Medicare Annual (Subsequent) preventive examination.  Review of Systems:  N/A  Cardiac Risk Factors include: advanced age (>2men, >18 women)     Objective:     Vitals: BP (!) 142/68 (BP Location: Right Arm)   Pulse 70   Temp 98.7 F (37.1 C) (Oral)   Ht 5\' 2"  (1.575 m)   Wt 132 lb 3.2 oz (60 kg)   BMI 24.18 kg/m   Body mass index is 24.18 kg/m.  Advanced Directives 08/18/2019 03/18/2017 04/25/2016 04/23/2016  Does Patient Have a Medical Advance Directive? Yes Yes Yes No  Type of Estate agent of [place];Living will - Living will;Healthcare Power of Attorney -  Copy of Healthcare Power of Attorney in Chart? No - copy requested - - -  Would patient like information on creating a medical advance directive? - - - Yes - Educational materials given    Tobacco Social History   Tobacco Use  Smoking Status Former Smoker  . Packs/day: 1.00  . Years: 5.00  . Pack years: 5.00  . Quit date: 12/23/1973  . Years since quitting: 45.6  Smokeless Tobacco Never Used     Counseling given: Not Answered   Clinical Intake:  Pre-visit preparation completed: Yes  Pain : No/denies pain Pain Score: 0-No pain     Nutritional Status: BMI of 19-24  Normal Nutritional Risks: None Diabetes: No  How often do you need to have someone help you when you read instructions, pamphlets, or other written materials from your doctor or pharmacy?: 1 - Never  Interpreter Needed?: No  Information entered by :: [facility], LPN  Past Medical History:  Diagnosis Date  . Bone spur   . Muscle spasm    Past Surgical History:  Procedure Laterality Date  . ABDOMINAL HYSTERECTOMY  1978   and oophorectomy  . APPENDECTOMY  1945  . BREAST BIOPSY Left early 80's   benign   . BUNIONECTOMY    . FOOT SURGERY Bilateral   . OOPHORECTOMY    . REPLACEMENT TOTAL KNEE     Family History  Problem Relation Age of Onset   . Breast cancer Mother   . Diabetes Mother   . Prostate cancer Father   . Heart attack Father    Social History   Socioeconomic History  . Marital status: Married    Spouse name: Renae Fickle  . Number of children: 1  . Years of education: 24  . Highest education level: High school graduate  Occupational History    Employer: [employer]  Social Needs  . Financial resource strain: Not hard at all  . Food insecurity    Worry: Never true    Inability: Never true  . Transportation needs    Medical: No    Non-medical: No  Tobacco Use  . Smoking status: Former Smoker    Packs/day: 1.00    Years: 5.00    Pack years: 5.00    Quit date: 12/23/1973    Years since quitting: 45.6  . Smokeless tobacco: Never Used  Substance and Sexual Activity  . Alcohol use: No    Comment: 1 drink monthly- daiquiri  . Drug use: No  . Sexual activity: Not Currently  Lifestyle  . Physical activity    Days per week: 7 days    Minutes per session: 60 min  . Stress: Not at all  Relationships  . Social connections  Talks on phone: Patient refused    Gets together: Patient refused    Attends religious service: Patient refused    Active member of club or organization: Patient refused    Attends meetings of clubs or organizations: Patient refused    Relationship status: Patient refused  Other Topics Concern  . Not on file  Social History Narrative  . Not on file    Outpatient Encounter Medications as of 08/18/2019  Medication Sig  . meloxicam (MOBIC) 15 MG tablet TAKE ONE TABLET EVERY DAY  . Multiple Vitamins-Minerals (MULTIVITAMIN WITH MINERALS) tablet Take 1 tablet by mouth daily.  Marland Kitchen omeprazole (PRILOSEC) 20 MG capsule TAKE 1 CAPSULE BY MOUTH ONCE DAILY (Patient taking differently: Take 20 mg by mouth as needed. )  . fluticasone (FLONASE) 50 MCG/ACT nasal spray Place 2 sprays into both nostrils daily. (Patient not taking: Reported on 10/08/2018)   No facility-administered encounter medications on  file as of 08/18/2019.     Activities of Daily Living In your present state of health, do you have any difficulty performing the following activities: 08/18/2019  Hearing? Y  Comment Does not wear hearing aids.  Vision? Y  Comment Due to dry eyes and needs an updated eye exam.  Difficulty concentrating or making decisions? N  Walking or climbing stairs? N  Dressing or bathing? N  Doing errands, shopping? N  Preparing Food and eating ? N  Using the Toilet? N  In the past six months, have you accidently leaked urine? N  Do you have problems with loss of bowel control? N  Managing your Medications? N  Managing your Finances? N  Housekeeping or managing your Housekeeping? N  Some recent data might be hidden    Patient Care Team: Virginia Crews, MD as PCP - General (Family Medicine) Sharlet Salina, MD as Referring Physician (Physical Medicine and Rehabilitation) Thornton Park, MD as Referring Physician (Orthopedic Surgery)    Assessment:   This is a routine wellness examination for [name].  Exercise Activities and Dietary recommendations Current Exercise Habits: Home exercise routine, Type of exercise: walking;Other - see comments(also plays golf 3 days a week), Time (Minutes): 60, Frequency (Times/Week): 4, Weekly Exercise (Minutes/Week): 240, Intensity: Moderate, Exercise limited by: None identified  Goals    . DIET - REDUCE SUGAR INTAKE     Recommend to cut back on eating desserts and sugary foods to a couple times a week.        Fall Risk: Fall Risk  08/18/2019 04/27/2019 03/21/2018 03/11/2017 02/27/2016  Falls in the past year? 0 0 No Yes No  Number falls in past yr: - 0 - 1 -  Comment - 0 - - -  Injury with Fall? - - - No -    FALL RISK PREVENTION PERTAINING TO THE HOME:  Any stairs in or around the home? Yes  If so, are there any without handrails? No   Home free of loose throw rugs in walkways, pet beds, electrical cords, etc? Yes  Adequate lighting in  your home to reduce risk of falls? Yes   ASSISTIVE DEVICES UTILIZED TO PREVENT FALLS:  Life alert? No  Use of a cane, walker or w/c? No  Grab bars in the bathroom? No  Shower chair or bench in shower? No  Elevated toilet seat or a handicapped toilet? No    TIMED UP AND GO:  Was the test performed? No .    Depression Screen PHQ 2/9 Scores 08/18/2019 04/27/2019 04/16/2019 03/21/2018  PHQ -  2 Score 0 0 0 0  PHQ- 9 Score - 1 - -     Cognitive Function: Declined today.        Immunization History  Administered Date(s) Administered  . Influenza Split 10/19/2006, 10/25/2007, 09/18/2008, 09/08/2009, 09/30/2010, 09/12/2011  . Influenza, High Dose Seasonal PF 10/05/2014, 09/22/2015, 10/02/2016  . Influenza-Unspecified 10/01/2017, 09/22/2018  . Pneumococcal Conjugate-13 03/12/2014  . Pneumococcal Polysaccharide-23 10/25/2004, 03/18/2017  . Tdap 10/24/2004, 09/12/2011  . Zoster 09/18/2008    Qualifies for Shingles Vaccine? Yes  Zostavax completed 09/18/08. Due for Shingrix. Education has been provided regarding the importance of this vaccine. Pt has been advised to call insurance company to determine out of pocket expense. Advised may also receive vaccine at local pharmacy or Health Dept. Verbalized acceptance and understanding.  Tdap: Up to date  Flu Vaccine: Due for Flu vaccine. Does the patient want to receive this vaccine today?  Yes .   Pneumococcal Vaccine: Completed series   Screening Tests Health Maintenance  Topic Date Due  . TETANUS/TDAP  09/11/2021  . INFLUENZA VACCINE  Completed  . DEXA SCAN  Completed  . PNA vac Low Risk Adult  Completed    Cancer Screenings:  Colorectal Screening: No longer required.   Mammogram: No longer required.   Bone Density: Completed 03/24/14. Results reflect NORMAL. No repeat needed unless advised by a physician.  Lung Cancer Screening: (Low Dose CT Chest recommended if Age 61-80 years, 30 pack-year currently smoking OR have quit w/in  15years.) does not qualify.   Additional Screening:  Vision Screening: Recommended annual ophthalmology exams for early detection of glaucoma and other disorders of the eye.  Dental Screening: Recommended annual dental exams for proper oral hygiene  Community Resource Referral:  CRR required this visit?  No       Plan:  I have personally reviewed and addressed the Medicare Annual Wellness questionnaire and have noted the following in the patient's chart:  A. Medical and social history B. Use of alcohol, tobacco or illicit drugs  C. Current medications and supplements D. Functional ability and status E.  Nutritional status F.  Physical activity G. Advance directives H. List of other physicians I.  Hospitalizations, surgeries, and ER visits in previous 12 months J.  Vitals K. Screenings such as hearing and vision if needed, cognitive and depression L. Referrals and appointments   In addition, I have reviewed and discussed with patient certain preventive protocols, quality metrics, and best practice recommendations. A written personalized care plan for preventive services as well as general preventive health recommendations were provided to patient. Nurse Health Advisor  Signed,    Corbin Falck [place], [place]  1/61/09608/25/2020 Nurse Health Advisor   Nurse Notes: None.

## 2019-10-29 DIAGNOSIS — H524 Presbyopia: Secondary | ICD-10-CM | POA: Diagnosis not present

## 2019-11-26 ENCOUNTER — Ambulatory Visit (INDEPENDENT_AMBULATORY_CARE_PROVIDER_SITE_OTHER): Payer: Medicare Other | Admitting: Family Medicine

## 2019-11-26 ENCOUNTER — Other Ambulatory Visit: Payer: Self-pay

## 2019-11-26 ENCOUNTER — Encounter: Payer: Self-pay | Admitting: Family Medicine

## 2019-11-26 VITALS — BP 138/70 | HR 72 | Temp 96.9°F | Resp 16 | Ht 63.0 in | Wt 131.6 lb

## 2019-11-26 DIAGNOSIS — E7800 Pure hypercholesterolemia, unspecified: Secondary | ICD-10-CM | POA: Diagnosis not present

## 2019-11-26 DIAGNOSIS — Z1231 Encounter for screening mammogram for malignant neoplasm of breast: Secondary | ICD-10-CM

## 2019-11-26 DIAGNOSIS — K219 Gastro-esophageal reflux disease without esophagitis: Secondary | ICD-10-CM

## 2019-11-26 DIAGNOSIS — R030 Elevated blood-pressure reading, without diagnosis of hypertension: Secondary | ICD-10-CM | POA: Diagnosis not present

## 2019-11-26 DIAGNOSIS — Z0000 Encounter for general adult medical examination without abnormal findings: Secondary | ICD-10-CM | POA: Diagnosis not present

## 2019-11-26 DIAGNOSIS — M810 Age-related osteoporosis without current pathological fracture: Secondary | ICD-10-CM

## 2019-11-26 DIAGNOSIS — M8949 Other hypertrophic osteoarthropathy, multiple sites: Secondary | ICD-10-CM

## 2019-11-26 DIAGNOSIS — M159 Polyosteoarthritis, unspecified: Secondary | ICD-10-CM

## 2019-11-26 MED ORDER — BACLOFEN 10 MG PO TABS: 10.0000 mg | ORAL_TABLET | Freq: Three times a day (TID) | ORAL | 0 refills | Status: DC | PRN

## 2019-11-26 NOTE — Assessment & Plan Note (Signed)
Much improved on manual recheck Patient always has elevated blood pressure initially during her visits, which corresponds to whitecoat hypertension She states home blood pressures are well controlled We will continue to monitor

## 2019-11-26 NOTE — Assessment & Plan Note (Signed)
Unable to see records from last DEXA Discussed possibly repeating, but patient declines due to advanced age Would not change management to repeat Repeat Vit D and Ca levels

## 2019-11-26 NOTE — Assessment & Plan Note (Signed)
Continue omeprazole prn Discussed dietary changes Discussed long term issues with PPIs

## 2019-11-26 NOTE — Progress Notes (Signed)
Patient: Jacqueline Daniels, Female    DOB: March 20, 1936, 83 y.o.   MRN: [id number] Visit Date: 11/26/2019  Today's Provider: Shirlee LatchAngela Bacigalupo, MD   Chief Complaint  Patient presents with  . Annual Exam  . Neck Pain   Subjective:    I Sulibeya S. Dimas, CMA, am acting as scribe for Shirlee LatchAngela Bacigalupo, MD.   Complete Physical Jacqueline ClossBonnie L Stitt is a 83 y.o. female. She feels well. She reports exercising daily. She reports she is sleeping fairly well. 08/18/2019 AWV with [facility] 04/18/2018 Mammogram-BI-RADS 1 05/19/2014 Colonoscopy-hyperplastic polyp. No repeat. ----------------------------------------------------------- Patient C/O neck pain, pt reports she has been under a lot of stress. Patient reports her husband is ill with COPD issues.  She has taken muscle relaxers for this in the past and it was helpful.  Review of Systems  Constitutional: Negative.   HENT: Positive for congestion and sneezing.   Eyes: Negative.   Respiratory: Negative.   Cardiovascular: Negative.   Gastrointestinal: Negative.   Endocrine: Negative.   Genitourinary: Negative.   Musculoskeletal: Positive for arthralgias, back pain and neck pain.  Skin: Negative.   Allergic/Immunologic: Negative.   Neurological: Negative.   Hematological: Negative.   Psychiatric/Behavioral: Positive for sleep disturbance.    Social History   Socioeconomic History  . Marital status: Married    Spouse name: Renae Fickleaul  . Number of children: 1  . Years of education: 7112  . Highest education level: High school graduate  Occupational History    Employer: [employer]  Social Needs  . Financial resource strain: Not hard at all  . Food insecurity    Worry: Never true    Inability: Never true  . Transportation needs    Medical: No    Non-medical: No  Tobacco Use  . Smoking status: Former Smoker    Packs/day: 1.00    Years: 5.00    Pack years: 5.00    Quit date: 12/23/1973    Years since quitting: 45.9  . Smokeless  tobacco: Never Used  Substance and Sexual Activity  . Alcohol use: No    Comment: 1 drink monthly- daiquiri  . Drug use: No  . Sexual activity: Not Currently  Lifestyle  . Physical activity    Days per week: 7 days    Minutes per session: 60 min  . Stress: Not at all  Relationships  . Social Musicianconnections    Talks on phone: Patient refused    Gets together: Patient refused    Attends religious service: Patient refused    Active member of club or organization: Patient refused    Attends meetings of clubs or organizations: Patient refused    Relationship status: Patient refused  . Intimate partner violence    Fear of current or ex partner: Patient refused    Emotionally abused: Patient refused    Physically abused: Patient refused    Forced sexual activity: Patient refused  Other Topics Concern  . Not on file  Social History Narrative  . Not on file    Past Medical History:  Diagnosis Date  . Bone spur   . Muscle spasm      Patient Active Problem List   Diagnosis Date Noted  . Family history of breast cancer in mother 03/18/2017  . Anxiety 02/01/2016  . Atypical migraine 02/01/2016  . White coat syndrome without diagnosis of hypertension 02/01/2016  . Hemorrhoid 02/01/2016  . Intestinal malabsorption 02/01/2016  . Gastro-esophageal reflux disease without esophagitis 02/01/2016  .  Skin lesion 02/01/2016  . 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 Hypercholesteremia 09/08/2009  . H/O: hysterectomy 07/21/2009  . Lumbar canal stenosis 07/21/2009  . Osteoarthritis 05/20/2009  . OP (osteoporosis) 07/23/2003    Past Surgical History:  Procedure Laterality Date  . ABDOMINAL HYSTERECTOMY  1978   and oophorectomy  . APPENDECTOMY  1945  . BREAST BIOPSY Left early 80's   benign   . BUNIONECTOMY    . FOOT SURGERY Bilateral   . OOPHORECTOMY    . REPLACEMENT TOTAL KNEE      Her family history includes Breast cancer in her mother; Diabetes in  her mother; Heart attack in her father; Prostate cancer in her father.   Current Outpatient Medications:  .  diphenhydramine-acetaminophen (TYLENOL PM) 25-500 MG TABS tablet, Take 1 tablet by mouth at bedtime as needed., Disp: , Rfl:  .  meloxicam (MOBIC) 15 MG tablet, TAKE ONE TABLET EVERY DAY, Disp: 90 tablet, Rfl: 1 .  Multiple Vitamins-Minerals (MULTIVITAMIN WITH MINERALS) tablet, Take 1 tablet by mouth daily., Disp: , Rfl:  .  omeprazole (PRILOSEC) 20 MG capsule, TAKE 1 CAPSULE BY MOUTH ONCE DAILY (Patient taking differently: Take 20 mg by mouth as needed. ), Disp: 90 capsule, Rfl: 1 .  baclofen (LIORESAL) 10 MG tablet, Take 1 tablet (10 mg total) by mouth 3 (three) times daily as needed for muscle spasms., Disp: 30 each, Rfl: 0  Patient Care Team: Virginia Crews, MD as PCP - General (Family Medicine) Sharlet Salina, MD as Referring Physician (Physical Medicine and Rehabilitation) Thornton Park, MD as Referring Physician (Orthopedic Surgery)     Objective:    Vitals: BP 138/70   Pulse 72   Temp (!) 96.9 F (36.1 C) (Temporal)   Resp 16   Ht 5\' 3"  (1.6 m)   Wt 131 lb 9.6 oz (59.7 kg)   BMI 23.31 kg/m   Physical Exam Vitals signs reviewed.  Constitutional:      General: She is not in acute distress.    Appearance: Normal appearance. She is well-developed. She is not diaphoretic.  HENT:     Head: Normocephalic and atraumatic.     Right Ear: Tympanic membrane, ear canal and external ear normal.     Left Ear: Tympanic membrane, ear canal and external ear normal.  Eyes:     General: No scleral icterus.    Conjunctiva/sclera: Conjunctivae normal.     Pupils: Pupils are equal, round, and reactive to light.  Neck:     Musculoskeletal: Neck supple.     Thyroid: No thyromegaly.  Cardiovascular:     Rate and Rhythm: Normal rate and regular rhythm.     Pulses: Normal pulses.     Heart sounds: Normal heart sounds. No murmur.  Pulmonary:     Effort: Pulmonary effort  is normal. No respiratory distress.     Breath sounds: Normal breath sounds. No wheezing or rales.  Abdominal:     General: There is no distension.     Palpations: Abdomen is soft.     Tenderness: There is no abdominal tenderness.  Musculoskeletal:        General: No deformity.     Right lower leg: No edema.     Left lower leg: No edema.  Lymphadenopathy:     Cervical: No cervical adenopathy.  Skin:    General: Skin is warm and dry.     Capillary Refill: Capillary refill takes less than 2 seconds.  Findings: No rash.  Neurological:     Mental Status: She is alert and oriented to person, place, and time. Mental status is at baseline.  Psychiatric:        Mood and Affect: Mood normal.        Behavior: Behavior normal.        Thought Content: Thought content normal.     Activities of Daily Living In your present state of health, do you have any difficulty performing the following activities: 08/18/2019  Hearing? Y  Comment Does not wear hearing aids.  Vision? Y  Comment Due to dry eyes and needs an updated eye exam.  Difficulty concentrating or making decisions? N  Walking or climbing stairs? N  Dressing or bathing? N  Doing errands, shopping? N  Preparing Food and eating ? N  Using the Toilet? N  In the past six months, have you accidently leaked urine? N  Do you have problems with loss of bowel control? N  Managing your Medications? N  Managing your Finances? N  Housekeeping or managing your Housekeeping? N  Some recent data might be hidden    Fall Risk Assessment Fall Risk  08/18/2019 04/27/2019 03/21/2018 03/11/2017 02/27/2016  Falls in the past year? 0 0 No Yes No  Number falls in past yr: - 0 - 1 -  Comment - 0 - - -  Injury with Fall? - - - No -     Depression Screen PHQ 2/9 Scores 08/18/2019 04/27/2019 04/16/2019 03/21/2018  PHQ - 2 Score 0 0 0 0  PHQ- 9 Score - 1 - -   Cognitive Testing - 6-CIT (patient refused)    Assessment & Plan:    Annual Physical  Reviewed patient's Family Medical History Reviewed and updated list of patient's medical providers Assessment of cognitive impairment was done Assessed patient's functional ability Established a written schedule for health screening services Health Risk Assessent Completed and Reviewed  Exercise Activities and Dietary recommendations Goals    . DIET - REDUCE SUGAR INTAKE     Recommend to cut back on eating desserts and sugary foods to a couple times a week.        Immunization History  Administered Date(s) Administered  . Fluad Quad(high Dose 65+) 08/18/2019  . Influenza Split 10/19/2006, 10/25/2007, 09/18/2008, 09/08/2009, 09/30/2010, 09/12/2011  . Influenza, High Dose Seasonal PF 10/05/2014, 09/22/2015, 10/02/2016  . Influenza-Unspecified 10/01/2017, 09/22/2018  . Pneumococcal Conjugate-13 03/12/2014  . Pneumococcal Polysaccharide-23 10/25/2004, 03/18/2017  . Tdap 10/24/2004, 09/12/2011  . Zoster 09/18/2008    Health Maintenance  Topic Date Due  . Janet Berlin  09/11/2021  . INFLUENZA VACCINE  Completed  . DEXA SCAN  Completed  . PNA vac Low Risk Adult  Completed     Discussed health benefits of physical activity, and encouraged her to engage in regular exercise appropriate for her age and condition.    ------------------------------------------------------------------------------------------------------------  Problem List Items Addressed This Visit      Cardiovascular and Mediastinum   White coat syndrome without diagnosis of hypertension    Much improved on manual recheck Patient always has elevated blood pressure initially during her visits, which corresponds to whitecoat hypertension She states home blood pressures are well controlled We will continue to monitor        Digestive   Gastro-esophageal reflux disease without esophagitis    Continue omeprazole prn Discussed dietary changes Discussed long term issues with PPIs      Relevant Orders   CBC  Musculoskeletal and Integument   Osteoarthritis    Discussed trying mobic prn or trying tylenol instead Discussed long term risks of NSAIDs      Relevant Medications   baclofen (LIORESAL) 10 MG tablet   OP (osteoporosis)    Unable to see records from last DEXA Discussed possibly repeating, but patient declines due to advanced age Would not change management to repeat Repeat Vit D and Ca levels      Relevant Orders   Vit D  25 hydroxy     Other   Hypercholesteremia    Not on statin Recheck FLP and CMP      Relevant Orders   Lipid panel   CMP (Comprehensive metabolic panel)    Other Visit Diagnoses    Encounter for annual physical exam    -  Primary   Relevant Orders   CBC   Lipid panel   CMP (Comprehensive metabolic panel)   Vit D  25 hydroxy   MM 3D SCREEN BREAST BILATERAL   Encounter for screening mammogram for malignant neoplasm of breast       Relevant Orders   MM 3D SCREEN BREAST BILATERAL       Return in about 1 year (around 11/25/2020) for CPE/AWV.   The entirety of the information documented in the History of Present Illness, Review of Systems and Physical Exam were personally obtained by me. Portions of this information were initially documented by Rondel Baton, CMA and reviewed by me for thoroughness and accuracy.    Bacigalupo, Marzella Schlein, MD MPH [facility]

## 2019-11-26 NOTE — Assessment & Plan Note (Signed)
Discussed trying mobic prn or trying tylenol instead Discussed long term risks of NSAIDs

## 2019-11-26 NOTE — Patient Instructions (Signed)
Preventive Care 81 Years and Older, Female Preventive care refers to lifestyle choices and visits with your health care provider that can promote health and wellness. This includes:  A yearly physical exam. This is also called an annual well check.  Regular dental and eye exams.  Immunizations.  Screening for certain conditions.  Healthy lifestyle choices, such as diet and exercise. What can I expect for my preventive care visit? Physical exam Your health care provider will check:  Height and weight. These may be used to calculate body mass index (BMI), which is a measurement that tells if you are at a healthy weight.  Heart rate and blood pressure.  Your skin for abnormal spots. Counseling Your health care provider may ask you questions about:  Alcohol, tobacco, and drug use.  Emotional well-being.  Home and relationship well-being.  Sexual activity.  Eating habits.  History of falls.  Memory and ability to understand (cognition).  Work and work Statistician.  Pregnancy and menstrual history. What immunizations do I need?  Influenza (flu) vaccine  This is recommended every year. Tetanus, diphtheria, and pertussis (Tdap) vaccine  You may need a Td booster every 10 years. Varicella (chickenpox) vaccine  You may need this vaccine if you have not already been vaccinated. Zoster (shingles) vaccine  You may need this after age 81. Pneumococcal conjugate (PCV13) vaccine  One dose is recommended after age 81. Pneumococcal polysaccharide (PPSV23) vaccine  One dose is recommended after age 81. Measles, mumps, and rubella (MMR) vaccine  You may need at least one dose of MMR if you were born in 1957 or later. You may also need a second dose. Meningococcal conjugate (MenACWY) vaccine  You may need this if you have certain conditions. Hepatitis A vaccine  You may need this if you have certain conditions or if you travel or work in places where you may be exposed  to hepatitis A. Hepatitis B vaccine  You may need this if you have certain conditions or if you travel or work in places where you may be exposed to hepatitis B. Haemophilus influenzae type b (Hib) vaccine  You may need this if you have certain conditions. You may receive vaccines as individual doses or as more than one vaccine together in one shot (combination vaccines). Talk with your health care provider about the risks and benefits of combination vaccines. What tests do I need? Blood tests  Lipid and cholesterol levels. These may be checked every 5 years, or more frequently depending on your overall health.  Hepatitis C test.  Hepatitis B test. Screening  Lung cancer screening. You may have this screening every year starting at age 81 if you have a 30-pack-year history of smoking and currently smoke or have quit within the past 15 years.  Colorectal cancer screening. All adults should have this screening starting at age 81 and continuing until age 15. Your health care provider may recommend screening at age 81 if you are at increased risk. You will have tests every 1-10 years, depending on your results and the type of screening test.  Diabetes screening. This is done by checking your blood sugar (glucose) after you have not eaten for a while (fasting). You may have this done every 1-3 years.  Mammogram. This may be done every 1-2 years. Talk with your health care provider about how often you should have regular mammograms.  BRCA-related cancer screening. This may be done if you have a family history of breast, ovarian, tubal, or peritoneal cancers.  Other tests  Sexually transmitted disease (STD) testing.  Bone density scan. This is done to screen for osteoporosis. You may have this done starting at age 81. Follow these instructions at home: Eating and drinking  Eat a diet that includes fresh fruits and vegetables, whole grains, lean protein, and low-fat dairy products. Limit  your intake of foods with high amounts of sugar, saturated fats, and salt.  Take vitamin and mineral supplements as recommended by your health care provider.  Do not drink alcohol if your health care provider tells you not to drink.  If you drink alcohol: ? Limit how much you have to 0-1 drink a day. ? Be aware of how much alcohol is in your drink. In the [place] one drink equals one 12 oz bottle of beer (355 mL), one 5 oz glass of wine (148 mL), or one 1 oz glass of hard liquor (44 mL). Lifestyle  Take daily care of your teeth and gums.  Stay active. Exercise for at least 30 minutes on 5 or more days each week.  Do not use any products that contain nicotine or tobacco, such as cigarettes, e-cigarettes, and chewing tobacco. If you need help quitting, ask your health care provider.  If you are sexually active, practice safe sex. Use a condom or other form of protection in order to prevent STIs (sexually transmitted infections).  Talk with your health care provider about taking a low-dose aspirin or statin. What's next?  Go to your health care provider once a year for a well check visit.  Ask your health care provider how often you should have your eyes and teeth checked.  Stay up to date on all vaccines. This information is not intended to replace advice given to you by your health care provider. Make sure you discuss any questions you have with your health care provider. Document Released: 01/06/2016 Document Revised: 12/04/2018 Document Reviewed: 12/04/2018 Elsevier Patient Education  2020 [employer].

## 2019-11-26 NOTE — Assessment & Plan Note (Signed)
Not on statin Recheck FLP and CMP 

## 2019-11-27 ENCOUNTER — Telehealth: Payer: Self-pay

## 2019-11-27 LAB — LIPID PANEL
Chol/HDL Ratio: 2.9 ratio (ref 0.0–4.4)
Cholesterol, Total: 248 mg/dL — ABNORMAL HIGH (ref 100–199)
HDL: 87 mg/dL (ref >39)
LDL Chol Calc (NIH): 146 mg/dL — ABNORMAL HIGH (ref 0–99)
Triglycerides: 87 mg/dL (ref 0–149)
VLDL Cholesterol Cal: 15 mg/dL (ref 5–40)

## 2019-11-27 LAB — CBC
Hematocrit: 36.9 % (ref 34.0–46.6)
Hemoglobin: 12.6 g/dL (ref 11.1–15.9)
MCH: 31.7 pg (ref 26.6–33.0)
MCHC: 34.1 g/dL (ref 31.5–35.7)
MCV: 93 fL (ref 79–97)
Platelets: 249 10*3/uL (ref 150–450)
RBC: 3.97 x10E6/uL (ref 3.77–5.28)
RDW: 11.8 % (ref 11.7–15.4)
WBC: 6.1 10*3/uL (ref 3.4–10.8)

## 2019-11-27 LAB — COMPREHENSIVE METABOLIC PANEL
ALT: 15 IU/L (ref 0–32)
AST: 22 IU/L (ref 0–40)
Albumin/Globulin Ratio: 1.7 (ref 1.2–2.2)
Albumin: 4.1 g/dL (ref 3.7–4.7)
Alkaline Phosphatase: 80 IU/L (ref 39–117)
BUN/Creatinine Ratio: 24 (ref 12–28)
BUN: 21 mg/dL (ref 8–27)
Bilirubin Total: 0.3 mg/dL (ref 0.0–1.2)
CO2: 21 mmol/L (ref 20–29)
Calcium: 9.6 mg/dL (ref 8.7–10.3)
Chloride: 101 mmol/L (ref 96–106)
Creatinine, Ser: 0.86 mg/dL (ref 0.57–1.00)
GFR calc Af Amer: 74 mL/min/{1.73_m2} (ref >59)
GFR calc non Af Amer: 64 mL/min/{1.73_m2} (ref >59)
Globulin, Total: 2.4 g/dL (ref 1.5–4.5)
Glucose: 88 mg/dL (ref 65–99)
Potassium: 3.8 mmol/L (ref 3.5–5.2)
Sodium: 140 mmol/L (ref 134–144)
Total Protein: 6.5 g/dL (ref 6.0–8.5)

## 2019-11-27 LAB — VITAMIN D 25 HYDROXY (VIT D DEFICIENCY, FRACTURES): Vit D, 25-Hydroxy: 37.4 ng/mL (ref 30.0–100.0)

## 2019-11-27 NOTE — Telephone Encounter (Signed)
Pt advised.   Thanks,   -Terryon Pineiro  

## 2019-11-27 NOTE — Telephone Encounter (Signed)
-----   Message from Virginia Crews, MD sent at 11/27/2019  8:12 AM EST ----- Normal labs. Cholesterol is stable. I recommend diet low in saturated fat and regular exercise - 30 min at least 5 times per week

## 2019-12-02 ENCOUNTER — Ambulatory Visit
Admission: RE | Admit: 2019-12-02 | Discharge: 2019-12-02 | Disposition: A | Discharge status: Home / Self Care | Payer: Medicare Other | Source: Ambulatory Visit | Attending: Family Medicine | Admitting: Family Medicine

## 2019-12-02 ENCOUNTER — Telehealth: Payer: Self-pay

## 2019-12-02 DIAGNOSIS — Z0000 Encounter for general adult medical examination without abnormal findings: Secondary | ICD-10-CM | POA: Diagnosis not present

## 2019-12-02 DIAGNOSIS — Z1231 Encounter for screening mammogram for malignant neoplasm of breast: Secondary | ICD-10-CM | POA: Diagnosis not present

## 2019-12-02 NOTE — Telephone Encounter (Signed)
-----   Message from Virginia Crews, MD sent at 12/02/2019 10:06 AM EST ----- Normal mammogram. Repeat in 1 yr

## 2019-12-02 NOTE — Telephone Encounter (Signed)
Patient's husband advised as below. Checked DPR.

## 2020-01-29 DIAGNOSIS — M25561 Pain in right knee: Secondary | ICD-10-CM | POA: Diagnosis not present

## 2020-01-29 DIAGNOSIS — M25562 Pain in left knee: Secondary | ICD-10-CM | POA: Diagnosis not present

## 2020-02-26 ENCOUNTER — Other Ambulatory Visit: Payer: Self-pay | Admitting: Physician Assistant

## 2020-04-08 DIAGNOSIS — M25512 Pain in left shoulder: Secondary | ICD-10-CM | POA: Diagnosis not present

## 2020-04-25 DIAGNOSIS — M25512 Pain in left shoulder: Secondary | ICD-10-CM | POA: Diagnosis not present

## 2020-04-27 DIAGNOSIS — M75122 Complete rotator cuff tear or rupture of left shoulder, not specified as traumatic: Secondary | ICD-10-CM | POA: Diagnosis not present

## 2020-04-27 DIAGNOSIS — M7542 Impingement syndrome of left shoulder: Secondary | ICD-10-CM | POA: Diagnosis not present

## 2020-05-03 ENCOUNTER — Other Ambulatory Visit: Payer: Self-pay | Admitting: Orthopedic Surgery

## 2020-05-03 DIAGNOSIS — M19012 Primary osteoarthritis, left shoulder: Secondary | ICD-10-CM

## 2020-05-11 ENCOUNTER — Ambulatory Visit
Admission: RE | Admit: 2020-05-11 | Discharge: 2020-05-11 | Disposition: A | Discharge status: Home / Self Care | Payer: Medicare Other | Source: Ambulatory Visit | Attending: Orthopedic Surgery | Admitting: Orthopedic Surgery

## 2020-05-11 ENCOUNTER — Other Ambulatory Visit: Payer: Self-pay

## 2020-05-11 DIAGNOSIS — Z01818 Encounter for other preprocedural examination: Secondary | ICD-10-CM | POA: Diagnosis not present

## 2020-05-11 DIAGNOSIS — M19012 Primary osteoarthritis, left shoulder: Secondary | ICD-10-CM | POA: Insufficient documentation

## 2020-05-25 ENCOUNTER — Other Ambulatory Visit: Payer: Self-pay | Admitting: Orthopedic Surgery

## 2020-05-25 ENCOUNTER — Encounter: Payer: Self-pay | Admitting: Orthopedic Surgery

## 2020-05-25 NOTE — H&P (Signed)
NAME: BLANDINA RENALDO MRN:   [id number] DOB:   [birth year]     HISTORY AND PHYSICAL  CHIEF COMPLAINT:  Left shoulder pain  HISTORY:   HESTER JOSLIN a 84 y.o. female  with left  Shoulder Pain Patient complains of left shoulder pain. The symptoms began several years ago. Aggravating factors: repetitive activity. Pain is located in the left glenohumeral region. Discomfort is described as aching, burning and sharp/stabbing. Symptoms are exacerbated by repetitive movements, overhead movements and lying on the shoulder. Evaluation to date: plain films: abnormal osteoarthritis. Therapy to date includes: rest, ice, avoidance of offending activity, OTC analgesics which are somewhat effective, prescription NSAIDS which are somewhat effective, home exercises which are somewhat effective, physical therapy which was somewhat effective and corticosteroid injection which was somewhat effective.  Plan for Left reverse total shoulder arthroplasty  PAST MEDICAL HISTORY:   Past Medical History:  Diagnosis Date   Bone spur    Muscle spasm     PAST SURGICAL HISTORY:   Past Surgical History:  Procedure Laterality Date   ABDOMINAL HYSTERECTOMY  1978   and oophorectomy   APPENDECTOMY  1945   BREAST BIOPSY Left early 80's   benign    BUNIONECTOMY     FOOT SURGERY Bilateral    OOPHORECTOMY     REPLACEMENT TOTAL KNEE      MEDICATIONS:  (Not in a hospital admission)   ALLERGIES:  No Known Allergies  REVIEW OF SYSTEMS:   Negative except HPI  FAMILY HISTORY:   Family History  Problem Relation Age of Onset   Breast cancer Mother    Diabetes Mother    Prostate cancer Father    Heart attack Father     SOCIAL HISTORY:   reports that she quit smoking about 46 years ago. She has a 5.00 pack-year smoking history. She has never used smokeless tobacco. She reports that she does not drink alcohol or use drugs.  PHYSICAL EXAM:  General appearance: alert, cooperative and no distress Neck: no  JVD, supple, symmetrical, trachea midline and thyroid not enlarged, symmetric, no tenderness/mass/nodules Resp: clear to auscultation bilaterally Cardio: regular rate and rhythm, S1, S2 normal, no murmur, click, rub or gallop GI: soft, non-tender; bowel sounds normal; no masses,  no organomegaly Extremities: extremities normal, atraumatic, no cyanosis or edema and Homans sign is negative, no sign of DVT Pulses: 2+ and symmetric Skin: Skin color, texture, turgor normal. No rashes or lesions Neurologic: Alert and oriented X 3, normal strength and tone. Normal symmetric reflexes. Normal coordination and gait    LABORATORY STUDIES: No results for input(s): WBC, HGB, HCT, PLT in the last 72 hours.  No results for input(s): NA, K, CL, CO2, GLUCOSE, BUN, CREATININE, CALCIUM in the last 72 hours.  STUDIES/RESULTS:  CT SHOULDER LEFT WO CONTRAST  Result Date: 05/11/2020 CLINICAL DATA:  Preop planning. Arthritis EXAM: CT OF THE UPPER LEFT EXTREMITY WITHOUT CONTRAST TECHNIQUE: Multidetector CT imaging of the upper left extremity was performed according to the standard protocol. COMPARISON:  None. FINDINGS: Bones/Joint/Cartilage No fracture or dislocation. Normal alignment. Small left glenohumeral joint effusion. Mild osteoarthritis of the left glenohumeral joint. Chondrocalcinosis of the left glenoid as can be seen with CPPD. Mild arthropathy of the acromioclavicular joint. Ligaments Ligaments are suboptimally evaluated by CT. Muscles and Tendons Calcific tendinosis of the supraspinatus tendon. Muscles demonstrate no focal abnormality. Moderate atrophy of the supraspinatus muscle. Possible supraspinatus tendon tear. Soft tissue No fluid collection or hematoma. No soft tissue mass. Visualized left lung  is clear. Thoracic aortic atherosclerosis. IMPRESSION: 1. Mild osteoarthritis of the left glenohumeral joint. 2. Chondrocalcinosis of the left glenoid as can be seen with CPPD. 3. Calcific tendinosis of the  supraspinatus tendon. Moderate atrophy of the supraspinatus muscle. Possible supraspinatus tendon tear. 4. Aortic Atherosclerosis (ICD10-I70.0). Electronically Signed   By: Kathreen Devoid   On: 05/11/2020 19:21    ASSESSMENT:  End stage osteoarthritis left shoulder         Active Problems:   * No active hospital problems. *    PLAN:  Left reverse total shoulder arthroplasty   Carlynn Spry 05/25/2020. 1:50 PM

## 2020-05-31 ENCOUNTER — Encounter
Admission: RE | Admit: 2020-05-31 | Discharge: 2020-05-31 | Disposition: A | Discharge status: Home / Self Care | Payer: Medicare Other | Source: Ambulatory Visit | Attending: Orthopedic Surgery | Admitting: Orthopedic Surgery

## 2020-05-31 ENCOUNTER — Other Ambulatory Visit: Payer: Self-pay

## 2020-05-31 DIAGNOSIS — Z20822 Contact with and (suspected) exposure to covid-19: Secondary | ICD-10-CM | POA: Diagnosis not present

## 2020-05-31 DIAGNOSIS — M19012 Primary osteoarthritis, left shoulder: Secondary | ICD-10-CM | POA: Diagnosis not present

## 2020-05-31 DIAGNOSIS — Z01818 Encounter for other preprocedural examination: Secondary | ICD-10-CM | POA: Diagnosis not present

## 2020-05-31 HISTORY — DX: Gastro-esophageal reflux disease without esophagitis: K21.9

## 2020-05-31 HISTORY — DX: Unspecified osteoarthritis, unspecified site: M19.90

## 2020-05-31 NOTE — Patient Instructions (Signed)
Your procedure is scheduled on: Mon 6/14 Report to Day Surgery. To find out your arrival time please call [phone] between 1PM - 3PM on Friday 6/11.  Remember: Instructions that are not followed completely may result in serious medical risk,  up to and including death, or upon the discretion of your surgeon and anesthesiologist your  surgery may need to be rescheduled.     _X__ 1. Do not eat food after midnight the night before your procedure.                 No gum chewing or hard candies. You may drink clear liquids up to 2 hours                 before you are scheduled to arrive for your surgery- DO not drink clear                 liquids within 2 hours of the start of your surgery.                 Clear Liquids include:  water, apple juice without pulp, clear Gatorade, G2 or                  Gatorade Zero (avoid Red/Purple/Blue), Black Coffee or Tea (Do not add                 anything to coffee or tea). _____2.   Complete the carbohydrate drink provided to you, 2 hours before arrival.  __X__2.  On the morning of surgery brush your teeth with toothpaste and water, you                may rinse your mouth with mouthwash if you wish.  Do not swallow any toothpaste of mouthwash.     ___ 3.  No Alcohol for 24 hours before or after surgery.   ___ 4.  Do Not Smoke or use e-cigarettes For 24 Hours Prior to Your Surgery.                 Do not use any chewable tobacco products for at least 6 hours prior to                 Surgery.  ___  5.  Do not use any recreational drugs (marijuana, cocaine, heroin, ecstacy, MDMA or other)                For at least one week prior to your surgery.  Combination of these drugs with anesthesia                May have life threatening results.  ____  6.  Bring all medications with you on the day of surgery if instructed.   _x___  7.  Notify your doctor if there is any change in your medical condition      (cold, fever,  infections).     Do not wear jewelry, make-up, hairpins, clips or nail polish. Do not wear lotions, powders, or perfumes.  Do not shave 48 hours prior to surgery. Do not bring valuables to the hospital.    [facility] is not responsible for any belongings or valuables.  Contacts, dentures or bridgework may not be worn into surgery. Leave your suitcase in the car. After surgery it may be brought to your room. For patients admitted to the hospital, discharge time is determined by your treatment team.   Patients discharged the day of surgery  will not be allowed to drive home.   Make arrangements for someone to be with you for the first 24 hours of your Same Day Discharge.    Please read over the following fact sheets that you were given:    _x___ Take these medicines the morning of surgery with A SIP OF WATER:    1. none  2.   3.   4.  5.  6.  ____ Fleet Enema (as directed)   __x__ Use CHG Soap (or wipes) as directed  ___x_ Use Benzoyl Peroxide Gel as instructed  ____ Use inhalers on the day of surgery  ____ Stop metformin 2 days prior to surgery    ____ Take 1/2 of usual insulin dose the night before surgery. No insulin the morning          of surgery.   ____ Stop Coumadin/Plavix/aspirin   __x__ Stop Anti-inflammatories meloxicam (MOBIC) 15 MG tablet, ibuprofen aleve and aspirin     May take tylenol   ____ Stop supplements until after surgery.    ____ Bring C-Pap to the hospital.

## 2020-06-02 ENCOUNTER — Other Ambulatory Visit: Payer: Medicare Other

## 2020-06-02 ENCOUNTER — Encounter
Admission: RE | Admit: 2020-06-02 | Discharge: 2020-06-02 | Disposition: A | Discharge status: Home / Self Care | Payer: Medicare Other | Source: Ambulatory Visit | Attending: Orthopedic Surgery | Admitting: Orthopedic Surgery

## 2020-06-02 ENCOUNTER — Other Ambulatory Visit: Payer: Self-pay

## 2020-06-02 DIAGNOSIS — Z20822 Contact with and (suspected) exposure to covid-19: Secondary | ICD-10-CM | POA: Insufficient documentation

## 2020-06-02 DIAGNOSIS — Z01810 Encounter for preprocedural cardiovascular examination: Secondary | ICD-10-CM | POA: Diagnosis not present

## 2020-06-02 DIAGNOSIS — I491 Atrial premature depolarization: Secondary | ICD-10-CM | POA: Insufficient documentation

## 2020-06-02 DIAGNOSIS — Z01818 Encounter for other preprocedural examination: Secondary | ICD-10-CM | POA: Insufficient documentation

## 2020-06-02 LAB — URINALYSIS, ROUTINE W REFLEX MICROSCOPIC
Bilirubin Urine: NEGATIVE
Glucose, UA: NEGATIVE mg/dL
Hgb urine dipstick: NEGATIVE
Ketones, ur: NEGATIVE mg/dL
Leukocytes,Ua: NEGATIVE
Nitrite: NEGATIVE
Protein, ur: NEGATIVE mg/dL
Specific Gravity, Urine: 1.004 — ABNORMAL LOW (ref 1.005–1.030)
pH: 7 (ref 5.0–8.0)

## 2020-06-02 LAB — CBC
HCT: 38.8 % (ref 36.0–46.0)
Hemoglobin: 13.1 g/dL (ref 12.0–15.0)
MCH: 31.9 pg (ref 26.0–34.0)
MCHC: 33.8 g/dL (ref 30.0–36.0)
MCV: 94.4 fL (ref 80.0–100.0)
Platelets: 292 10*3/uL (ref 150–400)
RBC: 4.11 MIL/uL (ref 3.87–5.11)
RDW: 12.3 % (ref 11.5–15.5)
WBC: 6.9 10*3/uL (ref 4.0–10.5)
nRBC: 0 % (ref 0.0–0.2)

## 2020-06-02 LAB — BASIC METABOLIC PANEL
Anion gap: 8 (ref 5–15)
BUN: 24 mg/dL — ABNORMAL HIGH (ref 8–23)
CO2: 28 mmol/L (ref 22–32)
Calcium: 9.5 mg/dL (ref 8.9–10.3)
Chloride: 104 mmol/L (ref 98–111)
Creatinine, Ser: 0.78 mg/dL (ref 0.44–1.00)
GFR calc Af Amer: >60 mL/min (ref >60)
GFR calc non Af Amer: >60 mL/min (ref >60)
Glucose, Bld: 92 mg/dL (ref 70–99)
Potassium: 3.6 mmol/L (ref 3.5–5.1)
Sodium: 140 mmol/L (ref 135–145)

## 2020-06-02 LAB — PROTIME-INR
INR: 0.9 (ref 0.8–1.2)
Prothrombin Time: 12.1 seconds (ref 11.4–15.2)

## 2020-06-02 LAB — SARS CORONAVIRUS 2 (TAT 6-24 HRS): SARS Coronavirus 2: NEGATIVE

## 2020-06-02 LAB — SURGICAL PCR SCREEN
MRSA, PCR: NEGATIVE
Staphylococcus aureus: NEGATIVE

## 2020-06-02 LAB — APTT: aPTT: 29 seconds (ref 24–36)

## 2020-06-06 ENCOUNTER — Observation Stay
Admission: RE | Admit: 2020-06-06 | Discharge: 2020-06-07 | Disposition: A | Discharge status: Home / Self Care | Payer: Medicare Other | Attending: Orthopedic Surgery | Admitting: Orthopedic Surgery

## 2020-06-06 ENCOUNTER — Observation Stay: Payer: Medicare Other

## 2020-06-06 ENCOUNTER — Encounter
Admission: RE | Disposition: A | Discharge status: Home / Self Care | Payer: Self-pay | Source: Home / Self Care | Attending: Orthopedic Surgery

## 2020-06-06 ENCOUNTER — Ambulatory Visit: Payer: Medicare Other

## 2020-06-06 ENCOUNTER — Encounter: Payer: Self-pay | Admitting: Orthopedic Surgery

## 2020-06-06 ENCOUNTER — Ambulatory Visit: Payer: Medicare Other | Admitting: Certified Registered Nurse Anesthetist

## 2020-06-06 ENCOUNTER — Other Ambulatory Visit: Payer: Self-pay

## 2020-06-06 DIAGNOSIS — F419 Anxiety disorder, unspecified: Secondary | ICD-10-CM | POA: Diagnosis not present

## 2020-06-06 DIAGNOSIS — M6281 Muscle weakness (generalized): Secondary | ICD-10-CM | POA: Diagnosis not present

## 2020-06-06 DIAGNOSIS — M19012 Primary osteoarthritis, left shoulder: Principal | ICD-10-CM | POA: Insufficient documentation

## 2020-06-06 DIAGNOSIS — Z79899 Other long term (current) drug therapy: Secondary | ICD-10-CM | POA: Insufficient documentation

## 2020-06-06 DIAGNOSIS — Z419 Encounter for procedure for purposes other than remedying health state, unspecified: Secondary | ICD-10-CM

## 2020-06-06 DIAGNOSIS — Z471 Aftercare following joint replacement surgery: Secondary | ICD-10-CM | POA: Diagnosis not present

## 2020-06-06 DIAGNOSIS — Z96612 Presence of left artificial shoulder joint: Secondary | ICD-10-CM

## 2020-06-06 DIAGNOSIS — Z6823 Body mass index (BMI) 23.0-23.9, adult: Secondary | ICD-10-CM | POA: Insufficient documentation

## 2020-06-06 DIAGNOSIS — K219 Gastro-esophageal reflux disease without esophagitis: Secondary | ICD-10-CM | POA: Insufficient documentation

## 2020-06-06 DIAGNOSIS — G8918 Other acute postprocedural pain: Secondary | ICD-10-CM | POA: Diagnosis not present

## 2020-06-06 DIAGNOSIS — Z87891 Personal history of nicotine dependence: Secondary | ICD-10-CM | POA: Diagnosis not present

## 2020-06-06 DIAGNOSIS — M25512 Pain in left shoulder: Secondary | ICD-10-CM | POA: Diagnosis not present

## 2020-06-06 DIAGNOSIS — E669 Obesity, unspecified: Secondary | ICD-10-CM | POA: Diagnosis not present

## 2020-06-06 DIAGNOSIS — M1990 Unspecified osteoarthritis, unspecified site: Secondary | ICD-10-CM | POA: Diagnosis not present

## 2020-06-06 HISTORY — PX: TOTAL SHOULDER ARTHROPLASTY: SHX126

## 2020-06-06 LAB — GLUCOSE, CAPILLARY: Glucose-Capillary: 95 mg/dL (ref 70–99)

## 2020-06-06 SURGERY — ARTHROPLASTY, SHOULDER, TOTAL
Anesthesia: General | Site: Shoulder | Laterality: Left

## 2020-06-06 MED ORDER — BUPIVACAINE HCL (PF) 0.5 % IJ SOLN
INTRAMUSCULAR | Status: AC
  Filled 2020-06-06: qty 10

## 2020-06-06 MED ORDER — ACETAMINOPHEN 500 MG PO TABS
500.0000 mg | ORAL_TABLET | Freq: Four times a day (QID) | ORAL | Status: DC
  Administered 2020-06-07 (×2): 500 mg via ORAL
  Filled 2020-06-06 (×2): qty 1

## 2020-06-06 MED ORDER — FENTANYL CITRATE (PF) 100 MCG/2ML IJ SOLN
INTRAMUSCULAR | Status: AC
  Administered 2020-06-06: 50 ug via INTRAVENOUS
  Filled 2020-06-06: qty 2

## 2020-06-06 MED ORDER — ONDANSETRON HCL 4 MG PO TABS: 4.0000 mg | ORAL_TABLET | Freq: Four times a day (QID) | ORAL | Status: DC | PRN

## 2020-06-06 MED ORDER — BUPIVACAINE LIPOSOME 1.3 % IJ SUSP
INTRAMUSCULAR | Status: AC
  Filled 2020-06-06: qty 20

## 2020-06-06 MED ORDER — ONDANSETRON HCL 4 MG/2ML IJ SOLN
INTRAMUSCULAR | Status: DC | PRN
  Administered 2020-06-06: 4 mg via INTRAVENOUS

## 2020-06-06 MED ORDER — SODIUM CHLORIDE 0.9 % IV SOLN: INTRAVENOUS | Status: DC

## 2020-06-06 MED ORDER — BUPIVACAINE HCL (PF) 0.25 % IJ SOLN
INTRAMUSCULAR | Status: AC
  Filled 2020-06-06: qty 30

## 2020-06-06 MED ORDER — ONDANSETRON HCL 4 MG/2ML IJ SOLN
INTRAMUSCULAR | Status: AC
  Filled 2020-06-06: qty 2

## 2020-06-06 MED ORDER — ROCURONIUM BROMIDE 100 MG/10ML IV SOLN
INTRAVENOUS | Status: DC | PRN
  Administered 2020-06-06: 50 mg via INTRAVENOUS

## 2020-06-06 MED ORDER — ACETAMINOPHEN 10 MG/ML IV SOLN
INTRAVENOUS | Status: DC | PRN
  Administered 2020-06-06: 1000 mg via INTRAVENOUS

## 2020-06-06 MED ORDER — TRANEXAMIC ACID-NACL 1000-0.7 MG/100ML-% IV SOLN
1000.0000 mg | INTRAVENOUS | Status: AC
  Administered 2020-06-06: 1000 mg via INTRAVENOUS

## 2020-06-06 MED ORDER — FENTANYL CITRATE (PF) 100 MCG/2ML IJ SOLN: 25.0000 ug | INTRAMUSCULAR | Status: DC | PRN

## 2020-06-06 MED ORDER — PHENOL 1.4 % MT LIQD
1.0000 | OROMUCOSAL | Status: DC | PRN
  Administered 2020-06-07 (×2): 1 via OROMUCOSAL
  Filled 2020-06-06 (×2): qty 177

## 2020-06-06 MED ORDER — KETOROLAC TROMETHAMINE 15 MG/ML IJ SOLN
7.5000 mg | Freq: Four times a day (QID) | INTRAMUSCULAR | Status: AC
  Administered 2020-06-06 – 2020-06-07 (×4): 7.5 mg via INTRAVENOUS
  Filled 2020-06-06 (×3): qty 1

## 2020-06-06 MED ORDER — BUPIVACAINE LIPOSOME 1.3 % IJ SUSP
INTRAMUSCULAR | Status: DC | PRN
  Administered 2020-06-06: 20 mL via PERINEURAL

## 2020-06-06 MED ORDER — CEFAZOLIN SODIUM-DEXTROSE 1-4 GM/50ML-% IV SOLN
1.0000 g | Freq: Four times a day (QID) | INTRAVENOUS | Status: AC
  Administered 2020-06-07 (×2): 1 g via INTRAVENOUS
  Filled 2020-06-06 (×4): qty 50

## 2020-06-06 MED ORDER — DEXMEDETOMIDINE HCL 200 MCG/2ML IV SOLN
INTRAVENOUS | Status: DC | PRN
  Administered 2020-06-06: 4 ug via INTRAVENOUS

## 2020-06-06 MED ORDER — SODIUM CHLORIDE FLUSH 0.9 % IV SOLN
INTRAVENOUS | Status: AC
  Filled 2020-06-06: qty 20

## 2020-06-06 MED ORDER — METOCLOPRAMIDE HCL 10 MG PO TABS: 5.0000 mg | ORAL_TABLET | Freq: Three times a day (TID) | ORAL | Status: DC | PRN

## 2020-06-06 MED ORDER — CEFAZOLIN SODIUM-DEXTROSE 2-4 GM/100ML-% IV SOLN
INTRAVENOUS | Status: AC
  Filled 2020-06-06: qty 100

## 2020-06-06 MED ORDER — CHLORHEXIDINE GLUCONATE 0.12 % MT SOLN
OROMUCOSAL | Status: AC
  Filled 2020-06-06: qty 15

## 2020-06-06 MED ORDER — HYDROCODONE-ACETAMINOPHEN 7.5-325 MG PO TABS
1.0000 | ORAL_TABLET | ORAL | Status: DC | PRN
  Filled 2020-06-06: qty 2

## 2020-06-06 MED ORDER — EPINEPHRINE PF 1 MG/ML IJ SOLN
INTRAMUSCULAR | Status: AC
  Filled 2020-06-06: qty 1

## 2020-06-06 MED ORDER — SODIUM CHLORIDE 0.9 % IR SOLN
Status: DC | PRN
  Administered 2020-06-06 (×2): 1000 mL

## 2020-06-06 MED ORDER — HYDROCODONE-ACETAMINOPHEN 5-325 MG PO TABS: 1.0000 | ORAL_TABLET | ORAL | Status: DC | PRN

## 2020-06-06 MED ORDER — MORPHINE SULFATE (PF) 2 MG/ML IV SOLN: 0.5000 mg | INTRAVENOUS | Status: DC | PRN

## 2020-06-06 MED ORDER — TRANEXAMIC ACID-NACL 1000-0.7 MG/100ML-% IV SOLN
INTRAVENOUS | Status: AC
  Filled 2020-06-06: qty 100

## 2020-06-06 MED ORDER — MENTHOL 3 MG MT LOZG
1.0000 | LOZENGE | OROMUCOSAL | Status: DC | PRN
  Filled 2020-06-06: qty 9

## 2020-06-06 MED ORDER — ACETAMINOPHEN 325 MG PO TABS: 325.0000 mg | ORAL_TABLET | Freq: Four times a day (QID) | ORAL | Status: DC | PRN

## 2020-06-06 MED ORDER — ONDANSETRON HCL 4 MG/2ML IJ SOLN: 4.0000 mg | Freq: Four times a day (QID) | INTRAMUSCULAR | Status: DC | PRN

## 2020-06-06 MED ORDER — ACETAMINOPHEN 10 MG/ML IV SOLN
INTRAVENOUS | Status: AC
  Filled 2020-06-06: qty 100

## 2020-06-06 MED ORDER — BUPIVACAINE-EPINEPHRINE (PF) 0.25% -1:200000 IJ SOLN
INTRAMUSCULAR | Status: DC | PRN
  Administered 2020-06-06: 20 mL

## 2020-06-06 MED ORDER — DEXAMETHASONE SODIUM PHOSPHATE 10 MG/ML IJ SOLN
INTRAMUSCULAR | Status: AC
  Filled 2020-06-06: qty 1

## 2020-06-06 MED ORDER — ORAL CARE MOUTH RINSE
15.0000 mL | Freq: Once | OROMUCOSAL | Status: AC
  Administered 2020-06-06: 15 mL via OROMUCOSAL

## 2020-06-06 MED ORDER — DEXAMETHASONE SODIUM PHOSPHATE 10 MG/ML IJ SOLN
INTRAMUSCULAR | Status: DC | PRN
  Administered 2020-06-06: 10 mg via INTRAVENOUS

## 2020-06-06 MED ORDER — CHLORHEXIDINE GLUCONATE 0.12 % MT SOLN: 15.0000 mL | Freq: Once | OROMUCOSAL | Status: AC

## 2020-06-06 MED ORDER — FAMOTIDINE 20 MG PO TABS: 20.0000 mg | ORAL_TABLET | Freq: Once | ORAL | Status: AC

## 2020-06-06 MED ORDER — LACTATED RINGERS IV SOLN: INTRAVENOUS | Status: DC

## 2020-06-06 MED ORDER — FENTANYL CITRATE (PF) 100 MCG/2ML IJ SOLN
INTRAMUSCULAR | Status: AC
  Filled 2020-06-06: qty 2

## 2020-06-06 MED ORDER — FENTANYL CITRATE (PF) 100 MCG/2ML IJ SOLN: 50.0000 ug | Freq: Once | INTRAMUSCULAR | Status: AC

## 2020-06-06 MED ORDER — BUPIVACAINE HCL (PF) 0.5 % IJ SOLN
INTRAMUSCULAR | Status: DC | PRN
  Administered 2020-06-06: 10 mL via PERINEURAL

## 2020-06-06 MED ORDER — METOCLOPRAMIDE HCL 5 MG/ML IJ SOLN: 5.0000 mg | Freq: Three times a day (TID) | INTRAMUSCULAR | Status: DC | PRN

## 2020-06-06 MED ORDER — CEFAZOLIN SODIUM-DEXTROSE 1-4 GM/50ML-% IV SOLN
INTRAVENOUS | Status: AC
  Administered 2020-06-06: 1 g via INTRAVENOUS
  Filled 2020-06-06: qty 50

## 2020-06-06 MED ORDER — LIDOCAINE HCL (CARDIAC) PF 100 MG/5ML IV SOSY
PREFILLED_SYRINGE | INTRAVENOUS | Status: DC | PRN
  Administered 2020-06-06: 100 mg via INTRAVENOUS

## 2020-06-06 MED ORDER — BACITRACIN 50000 UNITS IM SOLR
INTRAMUSCULAR | Status: AC
  Filled 2020-06-06: qty 2

## 2020-06-06 MED ORDER — LIDOCAINE HCL (PF) 1 % IJ SOLN
INTRAMUSCULAR | Status: AC
  Filled 2020-06-06: qty 5

## 2020-06-06 MED ORDER — LIDOCAINE HCL (PF) 1 % IJ SOLN
INTRAMUSCULAR | Status: DC | PRN
Start: 2020-06-06 — End: 2020-06-06
  Administered 2020-06-06: 3 mL

## 2020-06-06 MED ORDER — DOCUSATE SODIUM 100 MG PO CAPS
100.0000 mg | ORAL_CAPSULE | Freq: Two times a day (BID) | ORAL | Status: DC
  Administered 2020-06-06 – 2020-06-07 (×2): 100 mg via ORAL
  Filled 2020-06-06 (×3): qty 1

## 2020-06-06 MED ORDER — ONDANSETRON HCL 4 MG/2ML IJ SOLN: 4.0000 mg | Freq: Once | INTRAMUSCULAR | Status: DC | PRN

## 2020-06-06 MED ORDER — PROPOFOL 10 MG/ML IV BOLUS
INTRAVENOUS | Status: DC | PRN
  Administered 2020-06-06: 120 mg via INTRAVENOUS

## 2020-06-06 MED ORDER — EPHEDRINE SULFATE 50 MG/ML IJ SOLN
INTRAMUSCULAR | Status: DC | PRN
  Administered 2020-06-06: 10 mg via INTRAVENOUS
  Administered 2020-06-06: 5 mg via INTRAVENOUS

## 2020-06-06 MED ORDER — PROPOFOL 10 MG/ML IV BOLUS
INTRAVENOUS | Status: AC
  Filled 2020-06-06: qty 20

## 2020-06-06 MED ORDER — FAMOTIDINE 20 MG PO TABS
ORAL_TABLET | ORAL | Status: AC
  Administered 2020-06-06: 20 mg via ORAL
  Filled 2020-06-06: qty 1

## 2020-06-06 MED ORDER — CEFAZOLIN SODIUM-DEXTROSE 2-4 GM/100ML-% IV SOLN
2.0000 g | INTRAVENOUS | Status: AC
  Administered 2020-06-06: 2 g via INTRAVENOUS

## 2020-06-06 MED ORDER — KETOROLAC TROMETHAMINE 15 MG/ML IJ SOLN
INTRAMUSCULAR | Status: AC
  Filled 2020-06-06: qty 1

## 2020-06-06 MED ORDER — PHENYLEPHRINE HCL (PRESSORS) 10 MG/ML IV SOLN
INTRAVENOUS | Status: DC | PRN
  Administered 2020-06-06: 100 ug via INTRAVENOUS
  Administered 2020-06-06: 50 ug via INTRAVENOUS
  Administered 2020-06-06: 100 ug via INTRAVENOUS

## 2020-06-06 MED ORDER — FENTANYL CITRATE (PF) 100 MCG/2ML IJ SOLN
INTRAMUSCULAR | Status: DC | PRN
  Administered 2020-06-06 (×2): 25 ug via INTRAVENOUS
  Administered 2020-06-06: 50 ug via INTRAVENOUS

## 2020-06-06 MED ORDER — SUGAMMADEX SODIUM 200 MG/2ML IV SOLN
INTRAVENOUS | Status: DC | PRN
  Administered 2020-06-06: 100 mg via INTRAVENOUS

## 2020-06-06 MED ORDER — SODIUM CHLORIDE 0.9 % IV SOLN
INTRAVENOUS | Status: DC | PRN
  Administered 2020-06-06: 15 ug/min via INTRAVENOUS

## 2020-06-06 SURGICAL SUPPLY — 74 items
BIT DRILL 170X2.5X (BIT) ×1 IMPLANT
BIT DRL 170X2.5X (BIT) ×1
BLADE BOVIE TIP EXT 4 (BLADE) ×2 IMPLANT
BLADE SAW 90X25X1.19 OSCILLAT (BLADE) ×2 IMPLANT
BOWL CEMENT MIX W/ADAPTER (MISCELLANEOUS) ×2 IMPLANT
BRUSH SCRUB EZ  4% CHG (MISCELLANEOUS) ×2
BRUSH SCRUB EZ 4% CHG (MISCELLANEOUS) ×2 IMPLANT
CANISTER SUCT 1200ML W/VALVE (MISCELLANEOUS) ×2 IMPLANT
CANISTER SUCT 3000ML PPV (MISCELLANEOUS) ×4 IMPLANT
CHLORAPREP W/TINT 26 (MISCELLANEOUS) ×4 IMPLANT
COVER BACK TABLE REUSABLE LG (DRAPES) ×2 IMPLANT
COVER WAND RF STERILE (DRAPES) ×2 IMPLANT
CRADLE LAMINECT ARM (MISCELLANEOUS) ×4 IMPLANT
DRAPE 3/4 80X56 (DRAPES) ×4 IMPLANT
DRAPE INCISE IOBAN 66X60 STRL (DRAPES) ×4 IMPLANT
DRAPE SURG IRRIG POUCH 19X23 (DRAPES) ×2 IMPLANT
DRAPE U-SHAPE 47X51 STRL (DRAPES) ×2 IMPLANT
DRILL 2.5 (BIT) ×1
DRSG AQUACEL AG ADV 3.5X10 (GAUZE/BANDAGES/DRESSINGS) ×2 IMPLANT
ELECT REM PT RETURN 9FT ADLT (ELECTROSURGICAL) ×2
ELECTRODE REM PT RTRN 9FT ADLT (ELECTROSURGICAL) ×1 IMPLANT
EPI LT SZ 1 (Orthopedic Implant) ×2 IMPLANT
EPIPHYSIS LT SZ 1 (Orthopedic Implant) ×1 IMPLANT
GAUZE SPONGE 4X4 12PLY STRL (GAUZE/BANDAGES/DRESSINGS) ×2 IMPLANT
GAUZE XEROFORM 1X8 LF (GAUZE/BANDAGES/DRESSINGS) ×2 IMPLANT
GLENOSPHERE DELTA XTEND LAT 38 (Miscellaneous) ×2 IMPLANT
GLOVE INDICATOR 8.0 STRL GRN (GLOVE) ×2 IMPLANT
GLOVE SURG ORTHO 8.0 STRL STRW (GLOVE) ×2 IMPLANT
GOWN STRL REUS W/ TWL LRG LVL3 (GOWN DISPOSABLE) ×1 IMPLANT
GOWN STRL REUS W/ TWL XL LVL3 (GOWN DISPOSABLE) ×1 IMPLANT
GOWN STRL REUS W/TWL LRG LVL3 (GOWN DISPOSABLE) ×1
GOWN STRL REUS W/TWL XL LVL3 (GOWN DISPOSABLE) ×1
HOOD PEEL AWAY FLYTE STAYCOOL (MISCELLANEOUS) ×6 IMPLANT
IV NS 1000ML (IV SOLUTION) ×1
IV NS 1000ML BAXH (IV SOLUTION) ×1 IMPLANT
KIT STABILIZATION SHOULDER (MISCELLANEOUS) ×2 IMPLANT
KIT TURNOVER KIT A (KITS) ×2 IMPLANT
MASK FACE SPIDER DISP (MASK) ×2 IMPLANT
MAT ABSORB  FLUID 56X50 GRAY (MISCELLANEOUS) ×1
MAT ABSORB FLUID 56X50 GRAY (MISCELLANEOUS) ×1 IMPLANT
METAGLENE DELTA EXTEND (Trauma) ×1 IMPLANT
METAGLENE DXTEND (Trauma) ×2 IMPLANT
NDL SAFETY ECLIPSE 18X1.5 (NEEDLE) ×1 IMPLANT
NEEDLE HYPO 18GX1.5 SHARP (NEEDLE) ×1
NEEDLE REVERSE CUT 1/2 CRC (NEEDLE) ×2 IMPLANT
NS IRRIG 1000ML POUR BTL (IV SOLUTION) ×2 IMPLANT
PACK SHDR ARTHRO (MISCELLANEOUS) ×2 IMPLANT
PIN FIXATION 1/8DIA X 3INL (PIN) ×2 IMPLANT
PIN GUIDE 1.2 (PIN) ×2 IMPLANT
PIN GUIDE GLENOPHERE 1.5MX300M (PIN) ×2 IMPLANT
PIN METAGLENE 2.5 (PIN) ×2 IMPLANT
PULSAVAC PLUS IRRIG FAN TIP (DISPOSABLE) ×2
SCREW 4.5X18MM (Screw) ×1 IMPLANT
SCREW 4.5X24MM (Screw) ×2 IMPLANT
SCREW BN 18X4.5XSTRL SHLDR (Screw) ×1 IMPLANT
SCREW BN 24X4.5XLCK STRL (Screw) ×2 IMPLANT
SCREW LOCK DELTA XTEND 4.5X30 (Screw) ×2 IMPLANT
SHEET NEURO XL SOL CTL (MISCELLANEOUS) IMPLANT
SLING ARM LRG DEEP (SOFTGOODS) IMPLANT
SLING ARM M TX990204 (SOFTGOODS) ×2 IMPLANT
SPACER 38 PLUS 3 (Spacer) ×2 IMPLANT
SPONGE LAP 18X18 RF (DISPOSABLE) ×2 IMPLANT
STAPLER SKIN PROX 35W (STAPLE) ×2 IMPLANT
STEM DELTA DIA 10 HA (Stem) ×2 IMPLANT
STRAP SAFETY 5IN WIDE (MISCELLANEOUS) ×2 IMPLANT
SUT TICRON 2-0 30IN 311381 (SUTURE) ×2 IMPLANT
SUT VIC AB 0 CT2 27 (SUTURE) ×2 IMPLANT
SUT VIC AB 2-0 CT1 18 (SUTURE) ×2 IMPLANT
SUT VIC AB PLUS 45CM 1-MO-4 (SUTURE) IMPLANT
SYR 10ML LL (SYRINGE) ×2 IMPLANT
SYRINGE IRR TOOMEY STRL 70CC (SYRINGE) ×2 IMPLANT
TAPE SUT 30 1/2 CRC GREEN (SUTURE) IMPLANT
TIP FAN IRRIG PULSAVAC PLUS (DISPOSABLE) ×1 IMPLANT
WATER STERILE IRR 1000ML POUR (IV SOLUTION) ×4 IMPLANT

## 2020-06-06 NOTE — Anesthesia Postprocedure Evaluation (Signed)
Anesthesia Post Note  Patient: Jacqueline Daniels  Procedure(s) Performed: TOTAL SHOULDER ARTHROPLASTY-Trumatch (Left Shoulder)  Patient location during evaluation: PACU Anesthesia Type: General Level of consciousness: awake and alert and oriented Pain management: pain level controlled Vital Signs Assessment: post-procedure vital signs reviewed and stable Respiratory status: spontaneous breathing, nonlabored ventilation and respiratory function stable Cardiovascular status: blood pressure returned to baseline and stable Postop Assessment: no signs of nausea or vomiting Anesthetic complications: no   No complications documented.   Last Vitals:  Vitals:   06/06/20 1522 06/06/20 1546  BP:    Pulse: 98 98  Resp: 17 16  Temp: (!) 36.3 C   SpO2: 100% 98%    Last Pain:  Vitals:   06/06/20 1538  TempSrc:   PainSc: 0-No pain                 Alven Alverio

## 2020-06-06 NOTE — Anesthesia Procedure Notes (Signed)
Anesthesia Regional Block: Interscalene brachial plexus block   Pre-Anesthetic Checklist: ,, timeout performed, Correct Patient, Correct Site, Correct Laterality, Correct Procedure, Correct Position, site marked, Risks and benefits discussed,  Surgical consent,  Pre-op evaluation,  At surgeon's request and post-op pain management  Laterality: Left  Prep: chloraprep       Needles:  Injection technique: Single-shot  Needle Type: Stimiplex     Needle Length: 10cm  Needle Gauge: 21     Additional Needles:   Procedures:,,,, ultrasound used (permanent image in chart),,,,  Narrative:  Start time: 06/06/2020 12:16 PM End time: 06/06/2020 12:24 PM Injection made incrementally with aspirations every 5 mL.  Performed by: Personally  Anesthesiologist: Alver Fisher, MD  Additional Notes: Functioning IV was confirmed and monitors were applied.  A Stimuplex needle was used. Sterile prep and drape,hand hygiene and sterile gloves were used.  Negative aspiration and negative test dose prior to incremental administration of local anesthetic. The patient tolerated the procedure well.

## 2020-06-06 NOTE — Plan of Care (Signed)
°  Problem: Education: Goal: Knowledge of General Education information will improve Description: Including pain rating scale, medication(s)/side effects and non-pharmacologic comfort measures Outcome: Progressing   Problem: Clinical Measurements: Goal: Will remain free from infection Outcome: Progressing   Problem: Coping: Goal: Level of anxiety will decrease Outcome: Progressing   Problem: Elimination: Goal: Will not experience complications related to bowel motility Outcome: Progressing   Problem: Activity: Goal: Ability to tolerate increased activity will improve Outcome: Progressing   Problem: Pain Management: Goal: Pain level will decrease with appropriate interventions Outcome: Progressing

## 2020-06-06 NOTE — Anesthesia Procedure Notes (Signed)
Procedure Name: Intubation Date/Time: 06/06/2020 1:20 PM Performed by: Rosanne Gutting, CRNA Pre-anesthesia Checklist: Patient identified, Patient being monitored, Timeout performed, Emergency Drugs available and Suction available Patient Re-evaluated:Patient Re-evaluated prior to induction Oxygen Delivery Method: Circle system utilized Preoxygenation: Pre-oxygenation with 100% oxygen Induction Type: IV induction Ventilation: Mask ventilation without difficulty Laryngoscope Size: 3 and McGraph Grade View: Grade I Tube type: Oral Tube size: 7.0 mm Number of attempts: 1 Airway Equipment and Method: Stylet and Video-laryngoscopy Placement Confirmation: ETT inserted through vocal cords under direct vision,  positive ETCO2 and breath sounds checked- equal and bilateral Secured at: 21 cm Tube secured with: Tape Dental Injury: Teeth and Oropharynx as per pre-operative assessment

## 2020-06-06 NOTE — Op Note (Signed)
06/06/2020  3:09 PM  Patient:   Jacqueline Daniels  Pre-Op Diagnosis:   Y86.578 Primary osteoarthritis, left shoulder  Post-Op Diagnosis:   Same  Procedure:   Reverse left total shoulder arthroplasty.  Surgeon:   Cassell Smiles, MD  Assistant:   Altamese Cabal, PA-C  Anesthesia:   General endotracheal with an interscalene block placed preoperatively by the anesthesiologist.  Findings:   As above.  Complications:   None  EBL:  50 cc  Drains:   None  Closure:   Staples  Implants:   All press-fit J&J Delta Xtend Metaglene, Glenosphere 38 mm standard, Humeral PE Cup 38/+3, Eccentric Epiphysis Size 1 left, Porocoat standard stem 10 mm  Brief Clinical Note:   The patient has failed multiple conservative treatments for painful rotator tear arthropathy, including injections, medications and therapy. The patient presents at this time for a reverse left total shoulder arthroplasty.  Procedure:   The patient underwent placement of an interscalene block by the anesthesiologist in the preoperative holding area before being brought into the operating room and lain in the supine position. The patient then underwent general endotracheal intubation and anesthesia before a Foley catheter was inserted and the patient repositioned in the beach chair position using the beach chair positioner. The left shoulder and upper extremity were prepped with ChloraPrep solution before being draped sterilely. Preoperative antibiotics were administered. A standard anterior approach to the shoulder was made through an approximately 4-5 inch incision. The incision was carried down through the subcutaneous tissues to expose the deltopectoral fascia. The interval between the deltoid and pectoralis muscles was identified and this plane developed, retracting the cephalic vein laterally with the deltoid muscle. The conjoined tendon was identified. Its lateral margin was dissected and the Kolbel self-retraining retractor inserted.  The "three sisters" were identified and cauterized. Bursal tissues were removed to improve visualization. The subscapularis tendon was released from its attachment to the lesser tuberosity 1 cm proximal to its insertion and several tagging sutures placed. The inferior capsule was released with care after identifying and protecting the axillary nerve. The proximal humeral cut was made at approximately 30 of retroversion using the extra-medullary guide.   Attention was redirected to the glenoid. The labrum was debrided circumferentially before the center of the glenoid was marked with electrocautery. The guidewire was drilled into the glenoid neck using the appropriate guide. After verifying its position, it was overreamed with the mini-baseplate reamer to create a flat surface. The permanent mini-baseplate was impacted into place. It was stabilized with four peripheral screws. Locking screws were placed superiorly, anteriorly and inferiorly while nonlocking screw was placed  posteriorly. The permanent 38 mm glenosphere was then impacted into place and its Morse taper locking mechanism verified using manual distraction.  Attention was directed to the humeral side. The humeral canal was reamed sequentially beginning with the end-cutting reamer then progressing from a 6 mm reamer up to a 10 mm reamer. This provided excellent circumferential chatter. The canal was broached. This was left in place and a trial reduction performed using the standard trial humeral platform. The arm demonstrated excellent range of motion as the hand could be brought across the chest to the opposite shoulder and brought to the top of the patient's head and to the patient's ear. The shoulder appeared stable throughout this range of motion. The joint was dislocated and the trial components removed. The permanent stem was impacted into place with care taken to maintain the appropriate version. The permanent 38/+3 mm humeral  platform with the  standard insert was put together on the back table and impacted into place. Again, the [facility] taper locking mechanism was verified using manual distraction. The shoulder was relocated using two finger pressure and again placed through a range of motion with the findings as described above.  The wound was copiously irrigated with bacitracin saline solution using the jet lavage system. The deltopectoral interval was closed using #0 Vicryl interrupted sutures before the subcutaneous tissues were closed using 2-0 Vicryl interrupted sutures. The skin was closed using staples.  A sterile occlusive dressing was applied to the wound before the arm was placed into a shoulder immobilizer with an abduction pillow. A Polar Care system also was applied to the shoulder. The patient was then transferred back to a hospital bed before being awakened, extubated, and returned to the recovery room in satisfactory condition after tolerating the procedure well.

## 2020-06-06 NOTE — Transfer of Care (Signed)
Immediate Anesthesia Transfer of Care Note  Patient: Jacqueline Daniels  Procedure(s) Performed: TOTAL SHOULDER ARTHROPLASTY-Trumatch (Left Shoulder)  Patient Location: PACU  Anesthesia Type:General  Level of Consciousness: awake and alert   Airway & Oxygen Therapy: Patient Spontanous Breathing and Patient connected to face mask oxygen  Post-op Assessment: Report given to RN and Post -op Vital signs reviewed and stable  Post vital signs: Reviewed and stable  Last Vitals:  Vitals Value Taken Time  BP 153/85 06/06/20 1522  Temp    Pulse 98 06/06/20 1525  Resp 17 06/06/20 1525  SpO2 100 % 06/06/20 1525  Vitals shown include unvalidated device data.  Last Pain:  Vitals:   06/06/20 1522  TempSrc:   PainSc: (P) 0-No pain         Complications: No complications documented.

## 2020-06-06 NOTE — Anesthesia Preprocedure Evaluation (Signed)
Anesthesia Evaluation  Patient identified by MRN, date of birth, ID band Patient awake    Reviewed: Allergy & Precautions, NPO status , Patient's Chart, lab work & pertinent test results  History of Anesthesia Complications Negative for: history of anesthetic complications  Airway Mallampati: II  TM Distance: >3 FB Neck ROM: Full    Dental no notable dental hx.    Pulmonary neg sleep apnea, neg COPD, former smoker,    breath sounds clear to auscultation- rhonchi (-) wheezing      Cardiovascular (-) hypertension(-) CAD, (-) Past MI, (-) Cardiac Stents and (-) CABG  Rhythm:Regular Rate:Normal - Systolic murmurs and - Diastolic murmurs    Neuro/Psych  Headaches, neg Seizures Anxiety    GI/Hepatic Neg liver ROS, hiatal hernia, GERD  ,  Endo/Other  negative endocrine ROSneg diabetes  Renal/GU negative Renal ROS     Musculoskeletal  (+) Arthritis ,   Abdominal (+) - obese,   Peds  Hematology negative hematology ROS (+)   Anesthesia Other Findings Past Medical History: No date: Arthritis No date: Bone spur No date: GERD (gastroesophageal reflux disease) No date: Muscle spasm   Reproductive/Obstetrics                             Anesthesia Physical Anesthesia Plan  ASA: II  Anesthesia Plan: General   Post-op Pain Management:  Regional for Post-op pain   Induction: Intravenous  PONV Risk Score and Plan: 2 and Ondansetron, Dexamethasone and Midazolam  Airway Management Planned: Oral ETT  Additional Equipment:   Intra-op Plan:   Post-operative Plan: Extubation in OR  Informed Consent: I have reviewed the patients History and Physical, chart, labs and discussed the procedure including the risks, benefits and alternatives for the proposed anesthesia with the patient or authorized representative who has indicated his/her understanding and acceptance.     Dental advisory given  Plan  Discussed with: CRNA and Anesthesiologist  Anesthesia Plan Comments:         Anesthesia Quick Evaluation

## 2020-06-06 NOTE — H&P (Addendum)
The patient has been re-examined, and the chart reviewed, and there have been no interval changes to the documented history and physical.  Plan a left total shoulder arthroplasty today.  Anesthesia is consulted regarding a peripheral nerve block for post-operative pain.  The risks, benefits, and alternatives have been discussed at length, and the patient is willing to proceed.

## 2020-06-07 ENCOUNTER — Encounter: Payer: Self-pay | Admitting: Orthopedic Surgery

## 2020-06-07 DIAGNOSIS — F419 Anxiety disorder, unspecified: Secondary | ICD-10-CM | POA: Diagnosis not present

## 2020-06-07 DIAGNOSIS — K219 Gastro-esophageal reflux disease without esophagitis: Secondary | ICD-10-CM | POA: Diagnosis not present

## 2020-06-07 DIAGNOSIS — M1990 Unspecified osteoarthritis, unspecified site: Secondary | ICD-10-CM | POA: Diagnosis not present

## 2020-06-07 DIAGNOSIS — Z87891 Personal history of nicotine dependence: Secondary | ICD-10-CM | POA: Diagnosis not present

## 2020-06-07 DIAGNOSIS — Z6823 Body mass index (BMI) 23.0-23.9, adult: Secondary | ICD-10-CM | POA: Diagnosis not present

## 2020-06-07 DIAGNOSIS — Z79899 Other long term (current) drug therapy: Secondary | ICD-10-CM | POA: Diagnosis not present

## 2020-06-07 DIAGNOSIS — M6281 Muscle weakness (generalized): Secondary | ICD-10-CM | POA: Diagnosis not present

## 2020-06-07 DIAGNOSIS — E669 Obesity, unspecified: Secondary | ICD-10-CM | POA: Diagnosis not present

## 2020-06-07 DIAGNOSIS — M19012 Primary osteoarthritis, left shoulder: Secondary | ICD-10-CM | POA: Diagnosis not present

## 2020-06-07 MED ORDER — DOCUSATE SODIUM 100 MG PO CAPS: 100.0000 mg | ORAL_CAPSULE | Freq: Two times a day (BID) | ORAL | 0 refills | Status: DC

## 2020-06-07 MED ORDER — ASPIRIN EC 81 MG PO TBEC
81.0000 mg | DELAYED_RELEASE_TABLET | Freq: Every day | ORAL | 11 refills | Status: DC
Start: 2020-06-07 — End: 2020-11-29

## 2020-06-07 MED ORDER — ONDANSETRON HCL 4 MG PO TABS: 4.0000 mg | ORAL_TABLET | Freq: Four times a day (QID) | ORAL | 0 refills | Status: DC | PRN

## 2020-06-07 MED ORDER — OXYCODONE HCL 5 MG PO TABS: 5.0000 mg | ORAL_TABLET | ORAL | 0 refills | Status: DC | PRN

## 2020-06-07 NOTE — Evaluation (Signed)
Occupational Therapy Evaluation Patient Details Name: Jacqueline Daniels MRN: [id number] DOB: [birth year] Today's Date: 06/07/2020    History of Present Illness Pt is admitted for L reverse total shoulder arthroplasty, performed on 06/06/20. PMH includes muscle spasm and bone spur.   Clinical Impression   Ms Ibe was seen for an OT evaluation this date. Pt lives in ranch style home c husband. Prior to surgery, pt was active and independent. Pt has orders for LUE to be immobilized and will be NWBing per MD. Patient presents with impaired strength/ROM, pain, and sensation due to LUE with block not completely resolved yet. These impairments result in a decreased ability to perform self care tasks requiring MIN A for UB/LB dressing and bathing and MIN A for application of sling/immobilizer. Pt instructed in sling/immobilizer mgt, LUE precautions, adaptive strategies for bathing/dressing/toileting/grooming, positioning and considerations for sleep, and home/routines modifications to maximize falls prevention, safety, and independence. Handout provided. OT adjusted sling/immobilizer to improve comfort, optimize positioning, and to maximize skin integrity/safety. Pt verbalized understanding of all education/training provided. Pt will benefit from skilled OT services to address these limitations and improve independence in daily tasks. Recommend HHOT services to continue therapy to maximize return to PLOF, address home/routines modifications and safety, minimize falls risk, and minimize caregiver burden.       Follow Up Recommendations  Home health OT    Equipment Recommendations  None recommended by OT    Recommendations for Other Services       Precautions / Restrictions Precautions Precautions: Shoulder;Fall Shoulder Interventions: Shoulder sling/immobilizer Precaution Booklet Issued: Yes (comment) Precaution Comments: Pt educated on home exercises, reps, and L shoulder  immobilization. Restrictions Weight Bearing Restrictions: Yes LUE Weight Bearing: Non weight bearing      Mobility Bed Mobility Overal bed mobility: Modified Independent             General bed mobility comments: Pt required no physical assistnance however required verbal cues for L shoulder immobility.  Transfers Overall transfer level: Needs assistance Equipment used: None Transfers: Sit to/from Stand Sit to Stand: Supervision         General transfer comment: SUP sit<>Stand BSC and standard bed height - VCs to maintain LUE PCNs    Balance Overall balance assessment: Needs assistance Sitting-balance support: No upper extremity supported;Feet supported Sitting balance-Leahy Scale: Normal Sitting balance - Comments: Pt able to balance without feet supported with no notable sway in sitting.   Standing balance support: No upper extremity supported Standing balance-Leahy Scale: Good Standing balance comment: CGA for safety as pt slightly impulsive with quick movements during ambulation.                           ADL either performed or assessed with clinical judgement   ADL Overall ADL's : Needs assistance/impaired                                       General ADL Comments: MIN A + VCs to maintain PCNs for UBD including don/doff sling x2 and don/doff gown x2. VCs for safe transfer technique      Vision         Perception     Praxis      Pertinent Vitals/Pain Pain Assessment: Faces Pain Score: 3  Faces Pain Scale: Hurts a little bit Pain Location: L armpit Pain Descriptors / Indicators: Aching;Discomfort Pain  Intervention(s): Ice applied     Hand Dominance Left   Extremity/Trunk Assessment Upper Extremity Assessment Upper Extremity Assessment: Overall WFL for tasks assessed;LUE deficits/detail LUE: Unable to fully assess due to immobilization   Lower Extremity Assessment Lower Extremity Assessment: Overall WFL for tasks  assessed       Communication Communication Communication: No difficulties   Cognition Arousal/Alertness: Awake/alert Behavior During Therapy: WFL for tasks assessed/performed Overall Cognitive Status: Within Functional Limits for tasks assessed                                     General Comments       Exercises Exercises: Other exercises;Shoulder Shoulder Exercises Elbow Flexion: Left;10 reps;AAROM Elbow Extension: Left;10 reps;AAROM Wrist Flexion: Left;10 reps;AROM Wrist Extension: Left;10 reps;AROM Digit Composite Flexion: Left;10 reps;AROM Composite Extension: Left;10 reps;AROM Other Exercises Other Exercises: Pt educated re: OT role, d/c recs, functional application of shoulder pcns and NWBing pcns Other Exercises: UBD, don/doff sling x2, sit<>Stand, sit>sup, sitting/standing balacne/tolerance   Shoulder Instructions Shoulder Instructions ROM for elbow, wrist and digits of operated UE: Supervision/safety (x 10 reps; verbal cues for correct technique)    Home Living Family/patient expects to be discharged to:: Private residence Living Arrangements: Spouse/significant other Available Help at Discharge: Family;Available 24 hours/day Type of Home: House Home Access: Stairs to enter [employer] of Steps: 2 Entrance Stairs-Rails: Left Home Layout: One level     Bathroom Shower/Tub: Tub/shower unit;Door   [employer] Toilet: Standard     Home Equipment: None          Prior Functioning/Environment Level of Independence: Independent        Comments: Pt independent with I/ADLs - pt reports practicing one-handed dressing/toileting techniques PTA.         OT Problem List: Decreased range of motion;Decreased safety awareness;Decreased knowledge of precautions;Impaired UE functional use      OT Treatment/Interventions: Self-care/ADL training;Therapeutic exercise;Energy conservation;DME and/or AE instruction;Therapeutic  activities;Patient/family education;Balance training    OT Goals(Current goals can be found in the care plan section) Acute Rehab OT Goals Patient Stated Goal: to go home today OT Goal Formulation: With patient Time For Goal Achievement: 06/21/20 Potential to Achieve Goals: Good ADL Goals Pt Will Perform Eating: with set-up;sitting Pt Will Perform Upper Body Dressing: with set-up;with supervision;sitting;with caregiver independent in assisting Additional ADL Goal #1: Pt will independently verbalize x3 functional application of precautions to ADL tasks  OT Frequency: Min 1X/week   Barriers to D/C: Inaccessible home environment          Co-evaluation              AM-PAC OT "6 Clicks" Daily Activity     Outcome Measure Help from another person eating meals?: A Little Help from another person taking care of personal grooming?: A Little Help from another person toileting, which includes using toliet, bedpan, or urinal?: A Little Help from another person bathing (including washing, rinsing, drying)?: A Little Help from another person to put on and taking off regular upper body clothing?: A Little Help from another person to put on and taking off regular lower body clothing?: A Little 6 Click Score: 18   End of Session    Activity Tolerance: Patient tolerated treatment well Patient left: in bed;with call bell/phone within reach;with bed alarm set  OT Visit Diagnosis: Other abnormalities of gait and mobility (R26.89)  Time: 8413-2440 OT Time Calculation (min): 38 min Charges:  OT General Charges $OT Visit: 1 Visit OT Evaluation $OT Eval Low Complexity: 1 Low OT Treatments $Self Care/Home Management : 23-37 mins  Kathie Dike, M.S. OTR/L  06/07/20, 12:46 PM

## 2020-06-07 NOTE — Evaluation (Signed)
Physical Therapy Evaluation Patient Details Name: Jacqueline Daniels MRN: [id number] DOB: [birth year] Today's Date: 06/07/2020   History of Present Illness  Pt is admitted for L reverse total shoulder arthroplasty, performed on 06/06/20. PMH includes muscle spasm and bone spur.   Clinical Impression  Pt is an energetic and verbose 84 y/o female who was lying in bed upon arrival to room and agreeable to PT evaluation. Pt able to perform L hand and wrist AROM and L elbow AAROM for joint maintenance x 10 reps, with supervision and verbal cues for improved technique. Education given on L shoulder precautions and pt given education packet. Pt ambulated 300 feet with CGA for safety as pt is slightly impulsive with quick movements during gait. Pt limited at this time secondary to L shoulder pain and decreased mobility in L shoulder. Recommend skilled therapy services to address aforementioned deficits. This entire session was guided, instructed, and directly supervised by Elizabeth Palau, DPT.     Follow Up Recommendations Home health PT    Equipment Recommendations       Recommendations for Other Services       Precautions / Restrictions Precautions Precautions: Shoulder;Fall Shoulder Interventions: Shoulder sling/immobilizer Precaution Booklet Issued: Yes (comment) Precaution Comments: Pt educated on home exercises, reps, and L shoulder immobilization. Restrictions Weight Bearing Restrictions: Yes LUE Weight Bearing: Non weight bearing      Mobility  Bed Mobility Overal bed mobility: Modified Independent             General bed mobility comments: Pt required no physical assistnance however required verbal cues for L shoulder immobility.  Transfers Overall transfer level: Needs assistance Equipment used: None Transfers: Sit to/from Stand Sit to Stand: Supervision         General transfer comment: SUP sit<>Stand BSC and standard bed height - VCs to maintain LUE  PCNs  Ambulation/Gait Ambulation/Gait assistance: Min guard Gait Distance (Feet): 300 Feet Assistive device: None Gait Pattern/deviations: Step-through pattern     General Gait Details: Pt with reciprocal gait pattern, even step lengths bilaterally and pt able to maintain a steady gait speed throughout ambulation distance.  Stairs Stairs: Yes (rail on R) Stairs assistance: Min guard;Supervision Stair Management: One rail Right Number of Stairs: 4 General stair comments: Pt able to ascend/descend stairs with reciprocal pattern. Pt able to turn 180 degrees at top of stairs to descend without LOB. R handrail utilized.  Wheelchair Mobility    Modified Rankin (Stroke Patients Only)       Balance Overall balance assessment: Needs assistance Sitting-balance support: No upper extremity supported;Feet supported Sitting balance-Leahy Scale: Normal Sitting balance - Comments: Pt able to balance without feet supported with no notable sway in sitting.   Standing balance support: No upper extremity supported Standing balance-Leahy Scale: Good Standing balance comment: CGA for safety as pt slightly impulsive with quick movements during ambulation.                             Pertinent Vitals/Pain Pain Assessment: Faces Pain Score: 3  Faces Pain Scale: Hurts a little bit Pain Location: L armpit Pain Descriptors / Indicators: Aching;Discomfort Pain Intervention(s): Ice applied    Home Living Family/patient expects to be discharged to:: Private residence Living Arrangements: Spouse/significant other Available Help at Discharge: Family;Available 24 hours/day Type of Home: House Home Access: Stairs to enter Entrance Stairs-Rails: Left Entrance Stairs-Number of Steps: 2 Home Layout: One level Home Equipment: None  Prior Function Level of Independence: Independent         Comments: Pt independent with I/ADLs - pt reports practicing one-handed dressing/toileting  techniques PTA.      Hand Dominance   Dominant Hand: Left    Extremity/Trunk Assessment   Upper Extremity Assessment Upper Extremity Assessment: Overall WFL for tasks assessed;LUE deficits/detail LUE: Unable to fully assess due to immobilization    Lower Extremity Assessment Lower Extremity Assessment: Overall WFL for tasks assessed       Communication   Communication: No difficulties  Cognition Arousal/Alertness: Awake/alert Behavior During Therapy: WFL for tasks assessed/performed Overall Cognitive Status: Within Functional Limits for tasks assessed                                        General Comments      Exercises Shoulder Exercises Elbow Flexion: Left;10 reps;AAROM Elbow Extension: Left;10 reps;AAROM Wrist Flexion: Left;10 reps;AROM Wrist Extension: Left;10 reps;AROM Digit Composite Flexion: Left;10 reps;AROM Composite Extension: Left;10 reps;AROM Other Exercises Other Exercises: Pt educated re: OT role, d/c recs, functional application of shoulder pcns and NWBing pcns Other Exercises: UBD, don/doff sling x2, sit<>Stand, sit>sup, sitting/standing balacne/tolerance Other Exercises: Pt able to ambulate 25 feet to/from bathroom with CGA for safety. Pt performed transfer to/from bathroom toilet with CGA for steadying.  ROM for elbow, wrist and digits of operated UE: Supervision/safety (x 10 reps; verbal cues for correct technique)   Assessment/Plan    PT Assessment Patient needs continued PT services  PT Problem List Decreased strength;Decreased range of motion;Decreased activity tolerance;Decreased safety awareness;Impaired sensation;Pain       PT Treatment Interventions Stair training;Functional mobility training;Therapeutic activities;Therapeutic exercise;Balance training;Patient/family education    PT Goals (Current goals can be found in the Care Plan section)  Acute Rehab PT Goals Patient Stated Goal: to go home today PT Goal  Formulation: With patient Time For Goal Achievement: 06/21/20 Potential to Achieve Goals: Good Additional Goals Additional Goal #1: Pt will ambulate >200' and negotiate 2 stairs with no more than supervision. (for return to PLOF)    Frequency BID   Barriers to discharge        Co-evaluation               AM-PAC PT "6 Clicks" Mobility  Outcome Measure Help needed turning from your back to your side while in a flat bed without using bedrails?: A Little Help needed moving from lying on your back to sitting on the side of a flat bed without using bedrails?: A Little Help needed moving to and from a bed to a chair (including a wheelchair)?: A Little Help needed standing up from a chair using your arms (e.g., wheelchair or bedside chair)?: A Little Help needed to walk in hospital room?: A Little Help needed climbing 3-5 steps with a railing? : A Little 6 Click Score: 18    End of Session Equipment Utilized During Treatment: Gait belt;Other (comment) (L shoulder sling) Activity Tolerance: Patient tolerated treatment well Patient left: in chair;with call bell/phone within reach;with chair alarm set Nurse Communication: Mobility status PT Visit Diagnosis: Muscle weakness (generalized) (M62.81);Pain Pain - Right/Left: Left Pain - part of body: Shoulder    Time: 6712-4580 PT Time Calculation (min) (ACUTE ONLY): 30 min   Charges:   PT Evaluation $PT Eval Low Complexity: 1 Low PT Treatments $Therapeutic Exercise: 8-22 mins        Lulubelle Simcoe  Amariyon Maynes, SPT   Jarom Govan 06/07/2020, 1:14 PM

## 2020-06-07 NOTE — TOC Transition Note (Signed)
Transition of Care [facility]) - CM/SW Discharge Note   Patient Details  Name: Jacqueline Daniels MRN: [id number] Date of Birth: 06/14/1936  Transition of Care [facility]) CM/SW Contact:  Barrie Dunker, RN Phone Number: 06/07/2020, 1:23 PM   Clinical Narrative:      Per Everlean Alstrom with Dr Odis Luster office the patient is set up with Outpatient PT and has no needs       Patient Goals and CMS Choice        Discharge Placement                       Discharge Plan and Services                                     Social Determinants of Health (SDOH) Interventions     Readmission Risk Interventions No flowsheet data found.

## 2020-06-07 NOTE — Progress Notes (Signed)
  Subjective:  Patient reports pain as mild.    Objective:   VITALS:   Vitals:   06/06/20 2229 06/06/20 2344 06/07/20 0350 06/07/20 0733  BP: 121/82 134/71 122/80 126/63  Pulse: 100 98 82 86  Resp: 14 17 17 16   Temp: 97.8 F (36.6 C) 98.3 F (36.8 C) 98.3 F (36.8 C) 97.7 F (36.5 C)  TempSrc: Oral Oral  Oral  SpO2: 95% 97% 98% 100%  Weight:  61.2 kg    Height:  5\' 3"  (1.6 m)      PHYSICAL EXAM:  Neurologically intact ABD soft Neurovascular intact Sensation intact distally Intact pulses distally Dorsiflexion/Plantar flexion intact Incision: scant drainage No cellulitis present Compartment soft dressing changed  LABS  No results found for this or any previous visit (from the past 24 hour(s)).  DG Shoulder Left Port  Result Date: 06/06/2020 CLINICAL DATA:  Left shoulder arthroplasty. EXAM: LEFT SHOULDER COMPARISON:  Left shoulder CT 05/11/2020 FINDINGS: Sequelae of interval left reverse total shoulder arthroplasty are identified. Skin staples are in place. No acute fracture is identified. There is mild acromioclavicular osteoarthrosis. IMPRESSION: Interval left reverse total shoulder arthroplasty. Electronically Signed   By: 06/08/2020 M.D.   On: 06/06/2020 16:03   Sebastian Ache OR NERVE BLOCK-IMAGE ONLY [facility])  Result Date: 06/06/2020 There is no interpretation for this exam.  This order is for images obtained during a surgical procedure.  Please See "Surgeries" Tab for more information regarding the procedure.    Assessment/Plan: 1 Day Post-Op   Active Problems:   Status post total shoulder replacement, left   Advance diet Up with therapy  Discharge today   [facility] , PA-C 06/07/2020, 12:17 PM

## 2020-06-07 NOTE — Discharge Summary (Signed)
Physician Discharge Summary  Patient ID: Jacqueline Daniels MRN: [id number] DOB/AGE: 27-Oct-1936 84 y.o.  Admit date: 06/06/2020 Discharge date: 06/07/2020  Admission Diagnoses:  M19.012 Primary osteoarthritis, left shoulder <principal problem not specified>  Discharge Diagnoses:  M19.012 Primary osteoarthritis, left shoulder Active Problems:   Status post total shoulder replacement, left   Past Medical History:  Diagnosis Date  . Arthritis   . Bone spur   . GERD (gastroesophageal reflux disease)   . Muscle spasm     Surgeries: Procedure(s): TOTAL SHOULDER ARTHROPLASTY-[facility] on 06/06/2020   Consultants (if any):   Discharged Condition: Improved  Hospital Course: Jacqueline Daniels is an 84 y.o. female who was admitted 06/06/2020 with a diagnosis of  M19.012 Primary osteoarthritis, left shoulder <principal problem not specified> and went to the operating room on 06/06/2020 and underwent the above named procedures.    She was given perioperative antibiotics:  Anti-infectives (From admission, onward)   Start     Dose/Rate Route Frequency Ordered Stop   06/06/20 1830  ceFAZolin (ANCEF) IVPB 1 g/50 mL premix        1 g 100 mL/hr over 30 Minutes Intravenous Every 6 hours 06/06/20 1828 06/07/20 0614   06/06/20 1403  50,000 units bacitracin in 0.9% normal saline 250 mL irrigation  Status:  Discontinued          As needed 06/06/20 1403 06/06/20 1525   06/06/20 1102  ceFAZolin (ANCEF) 2-4 GM/100ML-% IVPB       Note to Pharmacy: Malva Limes   : cabinet override      06/06/20 1102 06/06/20 1354   06/06/20 1030  ceFAZolin (ANCEF) IVPB 2g/100 mL premix        2 g 200 mL/hr over 30 Minutes Intravenous On call to O.R. 06/06/20 1017 06/06/20 1355    .  She was given sequential compression devices, early ambulation, and aspirin for DVT prophylaxis.  She benefited maximally from the hospital stay and there were no complications.    Recent vital signs:  Vitals:   06/07/20 0350 06/07/20  0733  BP: 122/80 126/63  Pulse: 82 86  Resp: 17 16  Temp: 98.3 F (36.8 C) 97.7 F (36.5 C)  SpO2: 98% 100%    Recent laboratory studies:  Lab Results  Component Value Date   HGB 13.1 06/02/2020   HGB 12.6 11/26/2019   HGB 13.1 03/25/2018   Lab Results  Component Value Date   WBC 6.9 06/02/2020   PLT 292 06/02/2020   Lab Results  Component Value Date   INR 0.9 06/02/2020   Lab Results  Component Value Date   NA 140 06/02/2020   K 3.6 06/02/2020   CL 104 06/02/2020   CO2 28 06/02/2020   BUN 24 (H) 06/02/2020   CREATININE 0.78 06/02/2020   GLUCOSE 92 06/02/2020    Discharge Medications:   Allergies as of 06/07/2020   No Known Allergies     Medication List    STOP taking these medications   acetaminophen 650 MG CR tablet Commonly known as: TYLENOL   meloxicam 15 MG tablet Commonly known as: MOBIC     TAKE these medications   aspirin EC 81 MG tablet Take 1 tablet (81 mg total) by mouth daily. Swallow whole.   diphenhydramine-acetaminophen 25-500 MG Tabs tablet Commonly known as: TYLENOL PM Take 1 tablet by mouth at bedtime as needed (sleep).   docusate sodium 100 MG capsule Commonly known as: COLACE Take 1 capsule (100 mg total) by mouth 2 (  two) times daily.   multivitamin with minerals tablet Take 1 tablet by mouth daily.   ondansetron 4 MG tablet Commonly known as: ZOFRAN Take 1 tablet (4 mg total) by mouth every 6 (six) hours as needed for nausea.   oxyCODONE 5 MG immediate release tablet Commonly known as: Oxy IR/ROXICODONE Take 1 tablet (5 mg total) by mouth every 4 (four) hours as needed. What changed: reasons to take this       Diagnostic Studies: CT SHOULDER LEFT WO CONTRAST  Result Date: 05/11/2020 CLINICAL DATA:  Preop planning. Arthritis EXAM: CT OF THE UPPER LEFT EXTREMITY WITHOUT CONTRAST TECHNIQUE: Multidetector CT imaging of the upper left extremity was performed according to the standard protocol. COMPARISON:  None. FINDINGS:  Bones/Joint/Cartilage No fracture or dislocation. Normal alignment. Small left glenohumeral joint effusion. Mild osteoarthritis of the left glenohumeral joint. Chondrocalcinosis of the left glenoid as can be seen with CPPD. Mild arthropathy of the acromioclavicular joint. Ligaments Ligaments are suboptimally evaluated by CT. Muscles and Tendons Calcific tendinosis of the supraspinatus tendon. Muscles demonstrate no focal abnormality. Moderate atrophy of the supraspinatus muscle. Possible supraspinatus tendon tear. Soft tissue No fluid collection or hematoma. No soft tissue mass. Visualized left lung is clear. Thoracic aortic atherosclerosis. IMPRESSION: 1. Mild osteoarthritis of the left glenohumeral joint. 2. Chondrocalcinosis of the left glenoid as can be seen with CPPD. 3. Calcific tendinosis of the supraspinatus tendon. Moderate atrophy of the supraspinatus muscle. Possible supraspinatus tendon tear. 4. Aortic Atherosclerosis (ICD10-I70.0). Electronically Signed   By: Kathreen Devoid   On: 05/11/2020 19:21   DG Shoulder Left Port  Result Date: 06/06/2020 CLINICAL DATA:  Left shoulder arthroplasty. EXAM: LEFT SHOULDER COMPARISON:  Left shoulder CT 05/11/2020 FINDINGS: Sequelae of interval left reverse total shoulder arthroplasty are identified. Skin staples are in place. No acute fracture is identified. There is mild acromioclavicular osteoarthrosis. IMPRESSION: Interval left reverse total shoulder arthroplasty. Electronically Signed   By: Logan Bores M.D.   On: 06/06/2020 16:03   [place] OR NERVE BLOCK-IMAGE ONLY [facility])  Result Date: 06/06/2020 There is no interpretation for this exam.  This order is for images obtained during a surgical procedure.  Please See "Surgeries" Tab for more information regarding the procedure.    Disposition: Discharge disposition: 01-Home or Self Care            Signed: Carlynn Spry ,PA-C 06/07/2020, 12:45 PM

## 2020-06-07 NOTE — Progress Notes (Signed)
Patient still has numbness in her L hand. States it feels like her hand is asleep. She hasn't had any pain. Ambulates well to the bathroom.

## 2020-06-07 NOTE — Progress Notes (Signed)
Received MD order to discharge patient to home, reviewed home meds, discharge instructions, prescriptions and follow up appointments with patient and patient verbalized understanding    

## 2020-06-07 NOTE — Discharge Instructions (Signed)
   May shower with bandage in place.  If bandage becomes saturated, OK to removal and place band-aid.  You may be up walking around as tolerated but should take periodic breaks to elevate your arm.    Pain medication can cause constipation.  You should increase your fluid intake, increase your intake of high fiber foods and/or take Metamucil as needed for constipation.  You may shower.  You do NOT need to cover the dressing or incision site with plastic wrap.  The dressing or incision can get wet, but do not submerge under water.  After your staples have been removed, you should wait 48 hours before submerging incision under water.  Continue your physical therapy exercises at least twice daily.  It is a good idea to use an ice pack for 30 minutes after doing your exercises to reduce swelling.  Do not be surprised if you have increased pain at night.  This usually means you have been a little too active during the day and need to reduce your activities.  If you develop lower extremity swelling that does not improve after a night of elevation, please call the office.  This could be an early sign of a blood clot.  Call with questions, fever>101.5 degrees, shortness of breath or drainage from the wound  [phone]

## 2020-06-13 DIAGNOSIS — M25512 Pain in left shoulder: Secondary | ICD-10-CM | POA: Diagnosis not present

## 2020-06-15 DIAGNOSIS — M19012 Primary osteoarthritis, left shoulder: Secondary | ICD-10-CM | POA: Diagnosis not present

## 2020-06-22 DIAGNOSIS — M19012 Primary osteoarthritis, left shoulder: Secondary | ICD-10-CM | POA: Diagnosis not present

## 2020-06-24 DIAGNOSIS — M19012 Primary osteoarthritis, left shoulder: Secondary | ICD-10-CM | POA: Diagnosis not present

## 2020-06-28 DIAGNOSIS — M19012 Primary osteoarthritis, left shoulder: Secondary | ICD-10-CM | POA: Diagnosis not present

## 2020-07-01 DIAGNOSIS — M19012 Primary osteoarthritis, left shoulder: Secondary | ICD-10-CM | POA: Diagnosis not present

## 2020-07-06 DIAGNOSIS — M19012 Primary osteoarthritis, left shoulder: Secondary | ICD-10-CM | POA: Diagnosis not present

## 2020-07-08 DIAGNOSIS — M19012 Primary osteoarthritis, left shoulder: Secondary | ICD-10-CM | POA: Diagnosis not present

## 2020-07-14 DIAGNOSIS — M19012 Primary osteoarthritis, left shoulder: Secondary | ICD-10-CM | POA: Diagnosis not present

## 2020-07-19 DIAGNOSIS — Z96612 Presence of left artificial shoulder joint: Secondary | ICD-10-CM | POA: Diagnosis not present

## 2020-07-19 DIAGNOSIS — M19012 Primary osteoarthritis, left shoulder: Secondary | ICD-10-CM | POA: Diagnosis not present

## 2020-07-25 DIAGNOSIS — M19012 Primary osteoarthritis, left shoulder: Secondary | ICD-10-CM | POA: Diagnosis not present

## 2020-07-27 DIAGNOSIS — M19012 Primary osteoarthritis, left shoulder: Secondary | ICD-10-CM | POA: Diagnosis not present

## 2020-08-01 DIAGNOSIS — M19012 Primary osteoarthritis, left shoulder: Secondary | ICD-10-CM | POA: Diagnosis not present

## 2020-08-03 DIAGNOSIS — M19012 Primary osteoarthritis, left shoulder: Secondary | ICD-10-CM | POA: Diagnosis not present

## 2020-08-11 DIAGNOSIS — M19012 Primary osteoarthritis, left shoulder: Secondary | ICD-10-CM | POA: Diagnosis not present

## 2020-08-15 DIAGNOSIS — M19012 Primary osteoarthritis, left shoulder: Secondary | ICD-10-CM | POA: Diagnosis not present

## 2020-08-17 DIAGNOSIS — M19012 Primary osteoarthritis, left shoulder: Secondary | ICD-10-CM | POA: Diagnosis not present

## 2020-08-22 NOTE — Progress Notes (Signed)
Subjective:   Jacqueline Daniels is a 84 y.o. female who presents for Medicare Annual (Subsequent) preventive examination.  I connected with Jacqueline ReasonerBonnie Fallen today by telephone and verified that I am speaking with the correct person using two identifiers. Location patient: home Location provider: work Persons participating in the virtual visit: patient, provider.   I discussed the limitations, risks, security and privacy concerns of performing an evaluation and management service by telephone and the availability of in person appointments. I also discussed with the patient that there may be a patient responsible charge related to this service. The patient expressed understanding and verbally consented to this telephonic visit.    Interactive audio and video telecommunications were attempted between this provider and patient, however failed, due to patient having technical difficulties OR patient did not have access to video capability.  We continued and completed visit with audio only.   Review of Systems    N/A  Cardiac Risk Factors include: advanced age (>3955men, 57>65 women)     Objective:    There were no vitals filed for this visit. There is no height or weight on file to calculate BMI.  Advanced Directives 08/23/2020 06/07/2020 06/06/2020 05/31/2020 08/18/2019 03/18/2017 04/25/2016  Does Patient Have a Medical Advance Directive? Yes Yes Yes Yes Yes Yes Yes  Type of Estate agentAdvance Directive Healthcare Power of [place]orney;Living will Living will Healthcare Power of [place]orney;Living will Healthcare Power of [place]orney;Living will Healthcare Power of [place]orney;Living will - Living will;Healthcare Power of Attorney  Does patient want to make changes to medical advance directive? - No - Patient declined No - Guardian declined No - Patient declined - - -  Copy of Healthcare Power of Attorney in Chart? No - copy requested - No - copy requested No - copy requested No - copy requested - -  Would patient like information  on creating a medical advance directive? - - - - - - -    Current Medications (verified) Outpatient Encounter Medications as of 08/23/2020  Medication Sig  . acetaminophen (TYLENOL) 650 MG CR tablet Take 650 mg by mouth every 8 (eight) hours as needed for pain.  . diphenhydramine-acetaminophen (TYLENOL PM) 25-500 MG TABS tablet Take 1 tablet by mouth at bedtime as needed (sleep).   . Multiple Vitamins-Minerals (MULTIVITAMIN WITH MINERALS) tablet Take 1 tablet by mouth daily.  Marland Kitchen. aspirin EC 81 MG tablet Take 1 tablet (81 mg total) by mouth daily. Swallow whole. (Patient not taking: Reported on 08/23/2020)  . docusate sodium (COLACE) 100 MG capsule Take 1 capsule (100 mg total) by mouth 2 (two) times daily. (Patient not taking: Reported on 08/23/2020)  . ondansetron (ZOFRAN) 4 MG tablet Take 1 tablet (4 mg total) by mouth every 6 (six) hours as needed for nausea. (Patient not taking: Reported on 08/23/2020)  . oxyCODONE (OXY IR/ROXICODONE) 5 MG immediate release tablet Take 1 tablet (5 mg total) by mouth every 4 (four) hours as needed. (Patient not taking: Reported on 08/23/2020)   No facility-administered encounter medications on file as of 08/23/2020.    Allergies (verified) Patient has no known allergies.   History: Past Medical History:  Diagnosis Date  . Arthritis   . Bone spur   . GERD (gastroesophageal reflux disease)   . Muscle spasm    Past Surgical History:  Procedure Laterality Date  . ABDOMINAL HYSTERECTOMY  1978   and oophorectomy  . APPENDECTOMY  1945  . BREAST BIOPSY Left early 80's   benign   . BUNIONECTOMY    .  FOOT SURGERY Bilateral   . OOPHORECTOMY    . REPLACEMENT TOTAL KNEE Bilateral   . TOTAL SHOULDER ARTHROPLASTY Left 06/06/2020   Procedure: TOTAL SHOULDER ARTHROPLASTY-[facility];  Surgeon: Lyndle Herrlich, MD;  Location: [facility] ORS;  Service: Orthopedics;  Laterality: Left;   Family History  Problem Relation Age of Onset  . Breast cancer Mother   . Diabetes  Mother   . Prostate cancer Father   . Heart attack Father    Social History   Socioeconomic History  . Marital status: Married    Spouse name: Jacqueline Daniels  . Number of children: 1  . Years of education: 75  . Highest education level: High school graduate  Occupational History    Employer: [employer]  Tobacco Use  . Smoking status: Former Smoker    Packs/day: 1.00    Years: 5.00    Pack years: 5.00    Quit date: 12/23/1973    Years since quitting: 46.6  . Smokeless tobacco: Never Used  Vaping Use  . Vaping Use: Never used  Substance and Sexual Activity  . Alcohol use: No  . Drug use: No  . Sexual activity: Not Currently  Other Topics Concern  . Not on file  Social History Narrative  . Not on file   Social Determinants of Health   Financial Resource Strain: Low Risk   . Difficulty of Paying Living Expenses: Not hard at all  Food Insecurity: No Food Insecurity  . Worried About Programme researcher, broadcasting/film/video in the Last Year: Never true  . Ran Out of Food in the Last Year: Never true  Transportation Needs: No Transportation Needs  . Lack of Transportation (Medical): No  . Lack of Transportation (Non-Medical): No  Physical Activity: Sufficiently Active  . Days of Exercise per Week: 5 days  . Minutes of Exercise per Session: 60 min  Stress: No Stress Concern Present  . Feeling of Stress : Not at all  Social Connections: Moderately Integrated  . Frequency of Communication with Friends and Family: Three times a week  . Frequency of Social Gatherings with Friends and Family: Once a week  . Attends Religious Services: Never  . Active Member of Clubs or Organizations: Yes  . Attends Banker Meetings: More than 4 times per year  . Marital Status: Married    Tobacco Counseling Counseling given: Not Answered   Clinical Intake:  Pre-visit preparation completed: Yes  Pain : No/denies pain     Nutritional Risks: None Diabetes: No  How often do you need to have  someone help you when you read instructions, pamphlets, or other written materials from your doctor or pharmacy?: 1 - Never  Diabetic? No  Interpreter Needed?: No  Information entered by :: [facility], [place], LPN   Activities of Daily Living In your present state of health, do you have any difficulty performing the following activities: 08/23/2020 06/07/2020  Hearing? Y -  Comment Has hearing loss in both ears. Does not wear hearing aids. -  Vision? N -  Difficulty concentrating or making decisions? N -  Walking or climbing stairs? N -  Dressing or bathing? N -  Doing errands, shopping? N N  Preparing Food and eating ? N -  Using the Toilet? N -  In the past six months, have you accidently leaked urine? N -  Do you have problems with loss of bowel control? N -  Managing your Medications? N -  Managing your Finances? N -  Housekeeping or managing  your Housekeeping? N -  Some recent data might be hidden    Patient Care Team: Erasmo Downer, MD as PCP - General (Family Medicine) Merri Ray, MD as Referring Physician (Physical Medicine and Rehabilitation) Alvie Heidelberg, MD as Referring Physician (Orthopedic Surgery) [place], [facility]  Indicate any recent Medical Services you may have received from other than [facility] providers in the past year (date may be approximate).     Assessment:   This is a routine wellness examination for [place].  Hearing/Vision screen No exam data present  Dietary issues and exercise activities discussed: Current Exercise Habits: Home exercise routine, Type of exercise: walking, Time (Minutes): 60, Frequency (Times/Week): 5, Weekly Exercise (Minutes/Week): 300, Intensity: Mild, Exercise limited by: orthopedic condition(s)  Goals    . DIET - REDUCE SUGAR INTAKE     Recommend to cut back on eating desserts and sugary foods to a couple times a week.       Depression Screen PHQ 2/9 Scores 08/23/2020 08/18/2019 04/27/2019 04/16/2019  03/21/2018 03/11/2017 02/27/2016  PHQ - 2 Score 0 0 0 0 0 0 0  PHQ- 9 Score - - 1 - - 2 -    Fall Risk Fall Risk  08/23/2020 08/18/2019 04/27/2019 03/21/2018 03/11/2017  Falls in the past year? 0 0 0 No Yes  Number falls in past yr: 0 - 0 - 1  Comment - - 0 - -  Injury with Fall? 0 - - - No    Any stairs in or around the home? Yes  If so, are there any without handrails? No  Home free of loose throw rugs in walkways, pet beds, electrical cords, etc? Yes  Adequate lighting in your home to reduce risk of falls? Yes   ASSISTIVE DEVICES UTILIZED TO PREVENT FALLS:  Life alert? No  Use of a cane, walker or w/c? No  Grab bars in the bathroom? No  Shower chair or bench in shower? No  Elevated toilet seat or a handicapped toilet? No    Cognitive Function: Declined today.        Immunizations Immunization History  Administered Date(s) Administered  . Fluad Quad(high Dose 65+) 08/18/2019  . Influenza Split 10/19/2006, 10/25/2007, 09/18/2008, 09/08/2009, 09/30/2010, 09/12/2011  . Influenza, High Dose Seasonal PF 10/05/2014, 09/22/2015, 10/02/2016  . Influenza-Unspecified 10/01/2017, 09/22/2018  . PFIZER SARS-COV-2 Vaccination 01/22/2020, 02/12/2020  . Pneumococcal Conjugate-13 03/12/2014  . Pneumococcal Polysaccharide-23 10/25/2004, 03/18/2017  . Tdap 10/24/2004, 09/12/2011  . Zoster 09/18/2008    TDAP status: Up to date Flu Vaccine status: Due fall 2021 Pneumococcal vaccine status: Up to date Covid-19 vaccine status: Completed vaccines  Qualifies for Shingles Vaccine? Yes   Zostavax completed Yes   Shingrix Completed?: No.    Education has been provided regarding the importance of this vaccine. Patient has been advised to call insurance company to determine out of pocket expense if they have not yet received this vaccine. Advised may also receive vaccine at local pharmacy or Health Dept. Verbalized acceptance and understanding.  Screening Tests Health Maintenance  Topic Date Due    . INFLUENZA VACCINE  07/24/2020  . TETANUS/TDAP  09/11/2021  . DEXA SCAN  Completed  . COVID-19 Vaccine  Completed  . PNA vac Low Risk Adult  Completed    Health Maintenance  Health Maintenance Due  Topic Date Due  . INFLUENZA VACCINE  07/24/2020    Colorectal cancer screening: No longer required.  Mammogram status: No longer required.  Bone Density status: Completed 03/24/14. Previous  DEXA scan was normal. No repeat needed unless advised by a physician.  Lung Cancer Screening: (Low Dose CT Chest recommended if Age 75-80 years, 30 pack-year currently smoking OR have quit w/in 15years.) does not qualify.   Additional Screening:  Vision Screening: Recommended annual ophthalmology exams for early detection of glaucoma and other disorders of the eye. Is the patient up to date with their annual eye exam?  Yes  Who is the provider or what is the name of the office in which the patient attends annual eye exams? [facility] If pt is not established with a provider, would they like to be referred to a provider to establish care? Yes .   Dental Screening: Recommended annual dental exams for proper oral hygiene  Community Resource Referral / Chronic Care Management: CRR required this visit?  No   CCM required this visit?  No      Plan:     I have personally reviewed and noted the following in the patient's chart:   . Medical and social history . Use of alcohol, tobacco or illicit drugs  . Current medications and supplements . Functional ability and status . Nutritional status . Physical activity . Advanced directives . List of other physicians . Hospitalizations, surgeries, and ER visits in previous 12 months . Vitals . Screenings to include cognitive, depression, and falls . Referrals and appointments  In addition, I have reviewed and discussed with patient certain preventive protocols, quality metrics, and best practice recommendations. A written personalized care  plan for preventive services as well as general preventive health recommendations were provided to patient.     Lesley Galentine [place], [place]   [id number]   Nurse Notes: Flu vaccine due this fall.

## 2020-08-23 ENCOUNTER — Ambulatory Visit (INDEPENDENT_AMBULATORY_CARE_PROVIDER_SITE_OTHER): Payer: Medicare Other

## 2020-08-23 ENCOUNTER — Other Ambulatory Visit: Payer: Self-pay

## 2020-08-23 DIAGNOSIS — M19012 Primary osteoarthritis, left shoulder: Secondary | ICD-10-CM | POA: Diagnosis not present

## 2020-08-23 DIAGNOSIS — Z0000 Encounter for general adult medical examination without abnormal findings: Secondary | ICD-10-CM

## 2020-08-23 NOTE — Patient Instructions (Signed)
[name] , Thank you for taking time to come for your Medicare Wellness Visit. I appreciate your ongoing commitment to your health goals. Please review the following plan we discussed and let me know if I can assist you in the future.   Screening recommendations/referrals: Colonoscopy: No longer required.  Mammogram: No longer required.  Bone Density: Previous DEXA scan was normal. No repeat needed unless advised by a physician. Recommended yearly ophthalmology/optometry visit for glaucoma screening and checkup Recommended yearly dental visit for hygiene and checkup  Vaccinations: Influenza vaccine: Due fall 2021 Pneumococcal vaccine: Completed series Tdap vaccine: Up to date, due 08/2021 Shingles vaccine: Shingrix discussed. Please contact your pharmacy for coverage information.     Advanced directives: Please bring a copy of your POA (Power of Attorney) and/or Living Will to your next appointment.   Conditions/risks identified: Recommend to cut back on eating desserts and sugary foods to a couple times a week.   Next appointment: 11/29/20 @ 9:00 AM with Dr Beryle Flock    Preventive Care 65 Years and Older, Female Preventive care refers to lifestyle choices and visits with your health care provider that can promote health and wellness. What does preventive care include?  A yearly physical exam. This is also called an annual well check.  Dental exams once or twice a year.  Routine eye exams. Ask your health care provider how often you should have your eyes checked.  Personal lifestyle choices, including:  Daily care of your teeth and gums.  Regular physical activity.  Eating a healthy diet.  Avoiding tobacco and drug use.  Limiting alcohol use.  Practicing safe sex.  Taking low-dose aspirin every day.  Taking vitamin and mineral supplements as recommended by your health care provider. What happens during an annual well check? The services and screenings done by your  health care provider during your annual well check will depend on your age, overall health, lifestyle risk factors, and family history of disease. Counseling  Your health care provider may ask you questions about your:  Alcohol use.  Tobacco use.  Drug use.  Emotional well-being.  Home and relationship well-being.  Sexual activity.  Eating habits.  History of falls.  Memory and ability to understand (cognition).  Work and work Astronomer.  Reproductive health. Screening  You may have the following tests or measurements:  Height, weight, and BMI.  Blood pressure.  Lipid and cholesterol levels. These may be checked every 5 years, or more frequently if you are over 50 years old.  Skin check.  Lung cancer screening. You may have this screening every year starting at age 40 if you have a 30-pack-year history of smoking and currently smoke or have quit within the past 15 years.  Fecal occult blood test (FOBT) of the stool. You may have this test every year starting at age 87.  Flexible sigmoidoscopy or colonoscopy. You may have a sigmoidoscopy every 5 years or a colonoscopy every 10 years starting at age 41.  Hepatitis C blood test.  Hepatitis B blood test.  Sexually transmitted disease (STD) testing.  Diabetes screening. This is done by checking your blood sugar (glucose) after you have not eaten for a while (fasting). You may have this done every 1-3 years.  Bone density scan. This is done to screen for osteoporosis. You may have this done starting at age 13.  Mammogram. This may be done every 1-2 years. Talk to your health care provider about how often you should have regular mammograms. Talk  with your health care provider about your test results, treatment options, and if necessary, the need for more tests. Vaccines  Your health care provider may recommend certain vaccines, such as:  Influenza vaccine. This is recommended every year.  Tetanus, diphtheria, and  acellular pertussis (Tdap, Td) vaccine. You may need a Td booster every 10 years.  Zoster vaccine. You may need this after age 32.  Pneumococcal 13-valent conjugate (PCV13) vaccine. One dose is recommended after age 14.  Pneumococcal polysaccharide (PPSV23) vaccine. One dose is recommended after age 43. Talk to your health care provider about which screenings and vaccines you need and how often you need them. This information is not intended to replace advice given to you by your health care provider. Make sure you discuss any questions you have with your health care provider. Document Released: 01/06/2016 Document Revised: 08/29/2016 Document Reviewed: 10/11/2015 Elsevier Interactive Patient Education  2017 [place] Prevention in the Home Falls can cause injuries. They can happen to people of all ages. There are many things you can do to make your home safe and to help prevent falls. What can I do on the outside of my home?  Regularly fix the edges of walkways and driveways and fix any cracks.  Remove anything that might make you trip as you walk through a door, such as a raised step or threshold.  Trim any bushes or trees on the path to your home.  Use bright outdoor lighting.  Clear any walking paths of anything that might make someone trip, such as rocks or tools.  Regularly check to see if handrails are loose or broken. Make sure that both sides of any steps have handrails.  Any raised decks and porches should have guardrails on the edges.  Have any leaves, snow, or ice cleared regularly.  Use sand or salt on walking paths during winter.  Clean up any spills in your garage right away. This includes oil or grease spills. What can I do in the bathroom?  Use night lights.  Install grab bars by the toilet and in the tub and shower. Do not use towel bars as grab bars.  Use non-skid mats or decals in the tub or shower.  If you need to sit down in the shower, use  a plastic, non-slip stool.  Keep the floor dry. Clean up any water that spills on the floor as soon as it happens.  Remove soap buildup in the tub or shower regularly.  Attach bath mats securely with double-sided non-slip rug tape.  Do not have throw rugs and other things on the floor that can make you trip. What can I do in the bedroom?  Use night lights.  Make sure that you have a light by your bed that is easy to reach.  Do not use any sheets or blankets that are too big for your bed. They should not hang down onto the floor.  Have a firm chair that has side arms. You can use this for support while you get dressed.  Do not have throw rugs and other things on the floor that can make you trip. What can I do in the kitchen?  Clean up any spills right away.  Avoid walking on wet floors.  Keep items that you use a lot in easy-to-reach places.  If you need to reach something above you, use a strong step stool that has a grab bar.  Keep electrical cords out of the way.  Do  not use floor polish or wax that makes floors slippery. If you must use wax, use non-skid floor wax.  Do not have throw rugs and other things on the floor that can make you trip. What can I do with my stairs?  Do not leave any items on the stairs.  Make sure that there are handrails on both sides of the stairs and use them. Fix handrails that are broken or loose. Make sure that handrails are as long as the stairways.  Check any carpeting to make sure that it is firmly attached to the stairs. Fix any carpet that is loose or worn.  Avoid having throw rugs at the top or bottom of the stairs. If you do have throw rugs, attach them to the floor with carpet tape.  Make sure that you have a light switch at the top of the stairs and the bottom of the stairs. If you do not have them, ask someone to add them for you. What else can I do to help prevent falls?  Wear shoes that:  Do not have high heels.  Have  rubber bottoms.  Are comfortable and fit you well.  Are closed at the toe. Do not wear sandals.  If you use a stepladder:  Make sure that it is fully opened. Do not climb a closed stepladder.  Make sure that both sides of the stepladder are locked into place.  Ask someone to hold it for you, if possible.  Clearly mark and make sure that you can see:  Any grab bars or handrails.  First and last steps.  Where the edge of each step is.  Use tools that help you move around (mobility aids) if they are needed. These include:  Canes.  Walkers.  Scooters.  Crutches.  Turn on the lights when you go into a dark area. Replace any light bulbs as soon as they burn out.  Set up your furniture so you have a clear path. Avoid moving your furniture around.  If any of your floors are uneven, fix them.  If there are any pets around you, be aware of where they are.  Review your medicines with your doctor. Some medicines can make you feel dizzy. This can increase your chance of falling. Ask your doctor what other things that you can do to help prevent falls. This information is not intended to replace advice given to you by your health care provider. Make sure you discuss any questions you have with your health care provider. Document Released: 10/06/2009 Document Revised: 05/17/2016 Document Reviewed: 01/14/2015 Elsevier Interactive Patient Education  2017 [employer].

## 2020-08-25 DIAGNOSIS — M19012 Primary osteoarthritis, left shoulder: Secondary | ICD-10-CM | POA: Diagnosis not present

## 2020-08-30 DIAGNOSIS — M19012 Primary osteoarthritis, left shoulder: Secondary | ICD-10-CM | POA: Diagnosis not present

## 2020-09-02 DIAGNOSIS — M19012 Primary osteoarthritis, left shoulder: Secondary | ICD-10-CM | POA: Diagnosis not present

## 2020-09-06 DIAGNOSIS — M19012 Primary osteoarthritis, left shoulder: Secondary | ICD-10-CM | POA: Diagnosis not present

## 2020-09-08 DIAGNOSIS — M19012 Primary osteoarthritis, left shoulder: Secondary | ICD-10-CM | POA: Diagnosis not present

## 2020-09-13 DIAGNOSIS — M19012 Primary osteoarthritis, left shoulder: Secondary | ICD-10-CM | POA: Diagnosis not present

## 2020-09-20 DIAGNOSIS — M19012 Primary osteoarthritis, left shoulder: Secondary | ICD-10-CM | POA: Diagnosis not present

## 2020-09-22 DIAGNOSIS — M19012 Primary osteoarthritis, left shoulder: Secondary | ICD-10-CM | POA: Diagnosis not present

## 2020-09-28 DIAGNOSIS — M19012 Primary osteoarthritis, left shoulder: Secondary | ICD-10-CM | POA: Diagnosis not present

## 2020-09-30 DIAGNOSIS — M19012 Primary osteoarthritis, left shoulder: Secondary | ICD-10-CM | POA: Diagnosis not present

## 2020-10-03 DIAGNOSIS — H903 Sensorineural hearing loss, bilateral: Secondary | ICD-10-CM | POA: Diagnosis not present

## 2020-10-03 DIAGNOSIS — H6123 Impacted cerumen, bilateral: Secondary | ICD-10-CM | POA: Diagnosis not present

## 2020-10-04 DIAGNOSIS — M19012 Primary osteoarthritis, left shoulder: Secondary | ICD-10-CM | POA: Diagnosis not present

## 2020-10-06 DIAGNOSIS — M19012 Primary osteoarthritis, left shoulder: Secondary | ICD-10-CM | POA: Diagnosis not present

## 2020-10-11 DIAGNOSIS — M19012 Primary osteoarthritis, left shoulder: Secondary | ICD-10-CM | POA: Diagnosis not present

## 2020-10-13 DIAGNOSIS — M19012 Primary osteoarthritis, left shoulder: Secondary | ICD-10-CM | POA: Diagnosis not present

## 2020-10-17 ENCOUNTER — Other Ambulatory Visit: Payer: Self-pay | Admitting: Physician Assistant

## 2020-10-17 NOTE — Telephone Encounter (Signed)
Requested medication (s) are due for refill today: na  Requested medication (s) are on the active medication list: no   Last refill:  na   Future visit scheduled: yes in 1 month   Notes to clinic:  meloxicam 15 mg requested . Not on medication list. Do you want to renew Rx?     Requested Prescriptions  Pending Prescriptions Disp Refills   meloxicam (MOBIC) 15 MG tablet [Pharmacy Med Name: MELOXICAM 15 MG TAB] 90 tablet     Sig: TAKE ONE TABLET BY MOUTH EVERY DAY      Analgesics:  COX2 Inhibitors Passed - 10/17/2020  2:34 PM      Passed - HGB in normal range and within 360 days    Hemoglobin  Date Value Ref Range Status  06/02/2020 13.1 12.0 - 15.0 g/dL Final  03/50/0938 18.2 11.1 - 15.9 g/dL Final          Passed - Cr in normal range and within 360 days    Creatinine  Date Value Ref Range Status  08/28/2012 0.83 0.60 - 1.30 mg/dL Final   Creatinine, Ser  Date Value Ref Range Status  06/02/2020 0.78 0.44 - 1.00 mg/dL Final          Passed - Patient is not pregnant      Passed - Valid encounter within last 12 months    Recent Outpatient Visits           10 months ago Encounter for annual physical exam   [facility], Marzella Schlein, MD   1 year ago Rotator cuff tear arthropathy, left   [facility] [place], [place], [place]   1 year ago Bursitis of left shoulder   [facility] [place], Marzella Schlein, MD   2 years ago Costochondritis   [facility] [place], Marzella Schlein, MD   2 years ago Medicare annual wellness visit, subsequent   [facility] [place], Marzella Schlein, MD       Future Appointments             In 1 month Bacigalupo, Marzella Schlein, MD [facility]

## 2020-10-18 DIAGNOSIS — M19012 Primary osteoarthritis, left shoulder: Secondary | ICD-10-CM | POA: Diagnosis not present

## 2020-10-25 DIAGNOSIS — M19012 Primary osteoarthritis, left shoulder: Secondary | ICD-10-CM | POA: Diagnosis not present

## 2020-11-01 DIAGNOSIS — M19012 Primary osteoarthritis, left shoulder: Secondary | ICD-10-CM | POA: Diagnosis not present

## 2020-11-29 ENCOUNTER — Other Ambulatory Visit: Payer: Self-pay

## 2020-11-29 ENCOUNTER — Ambulatory Visit (INDEPENDENT_AMBULATORY_CARE_PROVIDER_SITE_OTHER): Payer: Medicare Other | Admitting: Family Medicine

## 2020-11-29 ENCOUNTER — Encounter: Payer: Self-pay | Admitting: Family Medicine

## 2020-11-29 VITALS — BP 140/90 | HR 76 | Temp 98.0°F | Resp 16 | Ht 63.0 in | Wt 138.0 lb

## 2020-11-29 DIAGNOSIS — Z1231 Encounter for screening mammogram for malignant neoplasm of breast: Secondary | ICD-10-CM

## 2020-11-29 DIAGNOSIS — Z0000 Encounter for general adult medical examination without abnormal findings: Secondary | ICD-10-CM

## 2020-11-29 DIAGNOSIS — E559 Vitamin D deficiency, unspecified: Secondary | ICD-10-CM

## 2020-11-29 DIAGNOSIS — E7800 Pure hypercholesterolemia, unspecified: Secondary | ICD-10-CM

## 2020-11-29 NOTE — Assessment & Plan Note (Signed)
Reviewed last lipid panel Not currently on a statin Recheck FLP and CMP Discussed diet and exercise  

## 2020-11-29 NOTE — Progress Notes (Signed)
Complete physical exam   Patient: Jacqueline Daniels   DOB: 27-Oct-1936   84 y.o. Female  MRN: [id number] Visit Date: 11/29/2020  Today's healthcare provider: Shirlee Latch, MD   Chief Complaint  Patient presents with   Annual Exam   Subjective    Jacqueline Daniels is a 84 y.o. female who presents today for a complete physical exam.  She reports consuming a general diet. The patient does not participate in regular exercise at present. She generally feels well. She reports sleeping well. She does not have additional problems to discuss today.  HPI   Had L shoulder replaced in June. Still working on home rehab  Past Medical History:  Diagnosis Date   Arthritis    Bone spur    GERD (gastroesophageal reflux disease)    Muscle spasm    Past Surgical History:  Procedure Laterality Date   ABDOMINAL HYSTERECTOMY  1978   and oophorectomy   APPENDECTOMY  1945   BREAST BIOPSY Left early 80's   benign    BUNIONECTOMY     FOOT SURGERY Bilateral    OOPHORECTOMY     REPLACEMENT TOTAL KNEE Bilateral    TOTAL SHOULDER ARTHROPLASTY Left 06/06/2020   Procedure: TOTAL SHOULDER ARTHROPLASTY-[place];  Surgeon: Lyndle Herrlich, MD;  Location: [facility] ORS;  Service: Orthopedics;  Laterality: Left;   Social History   Socioeconomic History   Marital status: Married    Spouse name: Renae Fickle   Number of children: 1   Years of education: 12   Highest education level: High school graduate  Occupational History    Employer: [employer]  Tobacco Use   Smoking status: Former Smoker    Packs/day: 1.00    Years: 5.00    Pack years: 5.00    Quit date: 12/23/1973    Years since quitting: 46.9   Smokeless tobacco: Never Used  Vaping Use   Vaping Use: Never used  Substance and Sexual Activity   Alcohol use: No   Drug use: No   Sexual activity: Not Currently  Other Topics Concern   Not on file  Social History Narrative   Not on file   Social Determinants of Health    Financial Resource Strain: Low Risk    Difficulty of Paying Living Expenses: Not hard at all  Food Insecurity: No Food Insecurity   Worried About Programme researcher, broadcasting/film/video in the Last Year: Never true   Ran Out of Food in the Last Year: Never true  Transportation Needs: No Transportation Needs   Lack of Transportation (Medical): No   Lack of Transportation (Non-Medical): No  Physical Activity: Sufficiently Active   Days of Exercise per Week: 5 days   Minutes of Exercise per Session: 60 min  Stress: No Stress Concern Present   Feeling of Stress : Not at all  Social Connections: Moderately Integrated   Frequency of Communication with Friends and Family: Three times a week   Frequency of Social Gatherings with Friends and Family: Once a week   Attends Religious Services: Never   Database administrator or Organizations: Yes   Attends Engineer, structural: More than 4 times per year   Marital Status: Married  Catering manager Violence: Not At Risk   Fear of Current or Ex-Partner: No   Emotionally Abused: No   Physically Abused: No   Sexually Abused: No   Family Status  Relation Name Status   Mother  Deceased at age 88  Father  Deceased at age 82   Son  Alive   Sister  Alive   Family History  Problem Relation Age of Onset   Breast cancer Mother    Diabetes Mother    Prostate cancer Father    Heart attack Father    No Known Allergies  Patient Care Team: Diamon Reddinger, Marzella SchleinAngela M, MD as PCP - General (Family Medicine) Merri Rayhasnis, Benjamin, MD as Referring Physician (Physical Medicine and Rehabilitation) Alvie HeidelbergBensen, Christopher, MD as Referring Physician (Orthopedic Surgery) [facility], [name]   Medications: Outpatient Medications Prior to Visit  Medication Sig   acetaminophen (TYLENOL) 650 MG CR tablet Take 650 mg by mouth every 8 (eight) hours as needed for pain.   meloxicam (MOBIC) 15 MG tablet TAKE ONE TABLET BY MOUTH EVERY DAY    Multiple Vitamins-Minerals (MULTIVITAMIN WITH MINERALS) tablet Take 1 tablet by mouth daily.   [DISCONTINUED] diphenhydramine-acetaminophen (TYLENOL PM) 25-500 MG TABS tablet Take 1 tablet by mouth at bedtime as needed (sleep).    [DISCONTINUED] aspirin EC 81 MG tablet Take 1 tablet (81 mg total) by mouth daily. Swallow whole. (Patient not taking: Reported on 08/23/2020)   [DISCONTINUED] docusate sodium (COLACE) 100 MG capsule Take 1 capsule (100 mg total) by mouth 2 (two) times daily. (Patient not taking: Reported on 08/23/2020)   [DISCONTINUED] ondansetron (ZOFRAN) 4 MG tablet Take 1 tablet (4 mg total) by mouth every 6 (six) hours as needed for nausea. (Patient not taking: Reported on 08/23/2020)   [DISCONTINUED] oxyCODONE (OXY IR/ROXICODONE) 5 MG immediate release tablet Take 1 tablet (5 mg total) by mouth every 4 (four) hours as needed. (Patient not taking: Reported on 08/23/2020)   No facility-administered medications prior to visit.    Review of Systems  Constitutional: Negative.   HENT: Positive for hearing loss.   Eyes: Negative.   Respiratory: Negative.   Cardiovascular: Negative.   Gastrointestinal: Negative.   Endocrine: Negative.   Genitourinary: Positive for frequency.  Musculoskeletal: Positive for arthralgias and back pain.  Skin: Negative.   Allergic/Immunologic: Negative.   Neurological: Negative.   Hematological: Negative.   Psychiatric/Behavioral: Negative.       Objective    BP 140/90 (BP Location: Left Arm, Patient Position: Sitting, Cuff Size: Normal)    Pulse 76    Temp 98 F (36.7 C) (Oral)    Resp 16    Ht 5\' 3"  (1.6 m)    Wt 138 lb (62.6 kg)    SpO2 97%    BMI 24.45 kg/m    Physical Exam Vitals reviewed.  Constitutional:      General: She is not in acute distress.    Appearance: Normal appearance. She is well-developed. She is not diaphoretic.  HENT:     Head: Normocephalic and atraumatic.     Right Ear: Tympanic membrane, ear canal and external  ear normal.     Left Ear: Tympanic membrane, ear canal and external ear normal.  Eyes:     General: No scleral icterus.    Conjunctiva/sclera: Conjunctivae normal.     Pupils: Pupils are equal, round, and reactive to light.  Neck:     Thyroid: No thyromegaly.  Cardiovascular:     Rate and Rhythm: Normal rate and regular rhythm.     Pulses: Normal pulses.     Heart sounds: Normal heart sounds. No murmur heard.   Pulmonary:     Effort: Pulmonary effort is normal. No respiratory distress.     Breath sounds: Normal breath sounds. No  wheezing or rales.  Abdominal:     General: There is no distension.     Palpations: Abdomen is soft.     Tenderness: There is no abdominal tenderness.  Musculoskeletal:        General: No deformity.     Cervical back: Neck supple.     Right lower leg: No edema.     Left lower leg: No edema.  Lymphadenopathy:     Cervical: No cervical adenopathy.  Skin:    General: Skin is warm and dry.     Findings: No rash.  Neurological:     Mental Status: She is alert and oriented to person, place, and time. Mental status is at baseline.     Sensory: No sensory deficit.     Motor: No weakness.     Gait: Gait normal.  Psychiatric:        Mood and Affect: Mood normal.        Behavior: Behavior normal.        Thought Content: Thought content normal.      Last depression screening scores PHQ 2/9 Scores 11/29/2020 08/23/2020 08/18/2019  PHQ - 2 Score 0 0 0  PHQ- 9 Score 0 - -   Last fall risk screening Fall Risk  11/29/2020  Falls in the past year? 1  Number falls in past yr: 0  Comment -  Injury with Fall? 0  Risk for fall due to : No Fall Risks  Follow up Follow up appointment   Last Audit-C alcohol use screening Alcohol Use Disorder Test (AUDIT) 11/29/2020  1. How often do you have a drink containing alcohol? 1  2. How many drinks containing alcohol do you have on a typical day when you are drinking? 0  3. How often do you have six or more drinks on one  occasion? 0  AUDIT-C Score 1  Alcohol Brief Interventions/Follow-up AUDIT Score <7 follow-up not indicated   A score of 3 or more in women, and 4 or more in men indicates increased risk for alcohol abuse, EXCEPT if all of the points are from question 1   No results found for any visits on 11/29/20.  Assessment & Plan    Routine Health Maintenance and Physical Exam  Exercise Activities and Dietary recommendations Goals     DIET - REDUCE SUGAR INTAKE     Recommend to cut back on eating desserts and sugary foods to a couple times a week.        Immunization History  Administered Date(s) Administered   Fluad Quad(high Dose 65+) 08/18/2019   Influenza Split 10/19/2006, 10/25/2007, 09/18/2008, 09/08/2009, 09/30/2010, 09/12/2011   Influenza, High Dose Seasonal PF 10/05/2014, 09/22/2015, 10/02/2016   Influenza-Unspecified 10/01/2017, 09/22/2018, 09/23/2020   PFIZER SARS-COV-2 Vaccination 01/22/2020, 02/12/2020   Pneumococcal Conjugate-13 03/12/2014   Pneumococcal Polysaccharide-23 10/25/2004, 03/18/2017   Tdap 10/24/2004, 09/12/2011   Zoster 09/18/2008    Health Maintenance  Topic Date Due   TETANUS/TDAP  09/11/2021   INFLUENZA VACCINE  Completed   DEXA SCAN  Completed   COVID-19 Vaccine  Completed   PNA vac Low Risk Adult  Completed    Discussed health benefits of physical activity, and encouraged her to engage in regular exercise appropriate for her age and condition.   Problem List Items Addressed This Visit      Other   Hypercholesteremia    Reviewed last lipid panel Not currently on a statin Recheck FLP and CMP Discussed diet and exercise  Relevant Orders   Comprehensive metabolic panel   Lipid Panel With LDL/HDL Ratio    Other Visit Diagnoses    Encounter for annual physical exam    -  Primary   Relevant Orders   Comprehensive metabolic panel   Lipid Panel With LDL/HDL Ratio   VITAMIN D 25 Hydroxy (Vit-D Deficiency, Fractures)    Avitaminosis D       Relevant Orders   VITAMIN D 25 Hydroxy (Vit-D Deficiency, Fractures)   Breast cancer screening by mammogram       Relevant Orders   MM 3D SCREEN BREAST BILATERAL       Return in about 1 year (around 11/29/2021) for CPE.     I, Shirlee Latch, MD, have reviewed all documentation for this visit. The documentation on 11/29/20 for the exam, diagnosis, procedures, and orders are all accurate and complete.   Tashae Inda, Marzella Schlein, MD, MPH [facility]

## 2020-11-29 NOTE — Patient Instructions (Addendum)
The CDC recommends two doses of Shingrix (the shingles vaccine) separated by 2 to 6 months for adults age 82 years and older. I recommend checking with your insurance plan regarding coverage for this vaccine.     Preventive Care 88 Years and Older, Female Preventive care refers to lifestyle choices and visits with your health care provider that can promote health and wellness. This includes:  A yearly physical exam. This is also called an annual well check.  Regular dental and eye exams.  Immunizations.  Screening for certain conditions.  Healthy lifestyle choices, such as diet and exercise. What can I expect for my preventive care visit? Physical exam Your health care provider will check:  Height and weight. These may be used to calculate body mass index (BMI), which is a measurement that tells if you are at a healthy weight.  Heart rate and blood pressure.  Your skin for abnormal spots. Counseling Your health care provider may ask you questions about:  Alcohol, tobacco, and drug use.  Emotional well-being.  Home and relationship well-being.  Sexual activity.  Eating habits.  History of falls.  Memory and ability to understand (cognition).  Work and work Statistician.  Pregnancy and menstrual history. What immunizations do I need?  Influenza (flu) vaccine  This is recommended every year. Tetanus, diphtheria, and pertussis (Tdap) vaccine  You may need a Td booster every 10 years. Varicella (chickenpox) vaccine  You may need this vaccine if you have not already been vaccinated. Zoster (shingles) vaccine  You may need this after age 2. Pneumococcal conjugate (PCV13) vaccine  One dose is recommended after age 59. Pneumococcal polysaccharide (PPSV23) vaccine  One dose is recommended after age 56. Measles, mumps, and rubella (MMR) vaccine  You may need at least one dose of MMR if you were born in 1957 or later. You may also need a second  dose. Meningococcal conjugate (MenACWY) vaccine  You may need this if you have certain conditions. Hepatitis A vaccine  You may need this if you have certain conditions or if you travel or work in places where you may be exposed to hepatitis A. Hepatitis B vaccine  You may need this if you have certain conditions or if you travel or work in places where you may be exposed to hepatitis B. Haemophilus influenzae type b (Hib) vaccine  You may need this if you have certain conditions. You may receive vaccines as individual doses or as more than one vaccine together in one shot (combination vaccines). Talk with your health care provider about the risks and benefits of combination vaccines. What tests do I need? Blood tests  Lipid and cholesterol levels. These may be checked every 5 years, or more frequently depending on your overall health.  Hepatitis C test.  Hepatitis B test. Screening  Lung cancer screening. You may have this screening every year starting at age 41 if you have a 30-pack-year history of smoking and currently smoke or have quit within the past 15 years.  Colorectal cancer screening. All adults should have this screening starting at age 42 and continuing until age 56. Your health care provider may recommend screening at age 34 if you are at increased risk. You will have tests every 1-10 years, depending on your results and the type of screening test.  Diabetes screening. This is done by checking your blood sugar (glucose) after you have not eaten for a while (fasting). You may have this done every 1-3 years.  Mammogram. This may  be done every 1-2 years. Talk with your health care provider about how often you should have regular mammograms.  BRCA-related cancer screening. This may be done if you have a family history of breast, ovarian, tubal, or peritoneal cancers. Other tests  Sexually transmitted disease (STD) testing.  Bone density scan. This is done to screen for  osteoporosis. You may have this done starting at age 65. Follow these instructions at home: Eating and drinking  Eat a diet that includes fresh fruits and vegetables, whole grains, lean protein, and low-fat dairy products. Limit your intake of foods with high amounts of sugar, saturated fats, and salt.  Take vitamin and mineral supplements as recommended by your health care provider.  Do not drink alcohol if your health care provider tells you not to drink.  If you drink alcohol: ? Limit how much you have to 0-1 drink a day. ? Be aware of how much alcohol is in your drink. In the [place] one drink equals one 12 oz bottle of beer (355 mL), one 5 oz glass of wine (148 mL), or one 1 oz glass of hard liquor (44 mL). Lifestyle  Take daily care of your teeth and gums.  Stay active. Exercise for at least 30 minutes on 5 or more days each week.  Do not use any products that contain nicotine or tobacco, such as cigarettes, e-cigarettes, and chewing tobacco. If you need help quitting, ask your health care provider.  If you are sexually active, practice safe sex. Use a condom or other form of protection in order to prevent STIs (sexually transmitted infections).  Talk with your health care provider about taking a low-dose aspirin or statin. What's next?  Go to your health care provider once a year for a well check visit.  Ask your health care provider how often you should have your eyes and teeth checked.  Stay up to date on all vaccines. This information is not intended to replace advice given to you by your health care provider. Make sure you discuss any questions you have with your health care provider. Document Revised: 12/04/2018 Document Reviewed: 12/04/2018 Elsevier Patient Education  2020 Elsevier Inc.  

## 2020-11-30 LAB — COMPREHENSIVE METABOLIC PANEL
ALT: 13 IU/L (ref 0–32)
AST: 22 IU/L (ref 0–40)
Albumin/Globulin Ratio: 1.5 (ref 1.2–2.2)
Albumin: 4.1 g/dL (ref 3.6–4.6)
Alkaline Phosphatase: 106 IU/L (ref 44–121)
BUN/Creatinine Ratio: 21 (ref 12–28)
BUN: 19 mg/dL (ref 8–27)
Bilirubin Total: 0.3 mg/dL (ref 0.0–1.2)
CO2: 22 mmol/L (ref 20–29)
Calcium: 9.8 mg/dL (ref 8.7–10.3)
Chloride: 102 mmol/L (ref 96–106)
Creatinine, Ser: 0.9 mg/dL (ref 0.57–1.00)
GFR calc Af Amer: 69 mL/min/{1.73_m2} (ref >59)
GFR calc non Af Amer: 60 mL/min/{1.73_m2} (ref >59)
Globulin, Total: 2.8 g/dL (ref 1.5–4.5)
Glucose: 91 mg/dL (ref 65–99)
Potassium: 4 mmol/L (ref 3.5–5.2)
Sodium: 139 mmol/L (ref 134–144)
Total Protein: 6.9 g/dL (ref 6.0–8.5)

## 2020-11-30 LAB — LIPID PANEL WITH LDL/HDL RATIO
Cholesterol, Total: 236 mg/dL — ABNORMAL HIGH (ref 100–199)
HDL: 79 mg/dL (ref >39)
LDL Chol Calc (NIH): 136 mg/dL — ABNORMAL HIGH (ref 0–99)
LDL/HDL Ratio: 1.7 ratio (ref 0.0–3.2)
Triglycerides: 123 mg/dL (ref 0–149)
VLDL Cholesterol Cal: 21 mg/dL (ref 5–40)

## 2020-11-30 LAB — VITAMIN D 25 HYDROXY (VIT D DEFICIENCY, FRACTURES): Vit D, 25-Hydroxy: 37.5 ng/mL (ref 30.0–100.0)

## 2021-01-02 ENCOUNTER — Ambulatory Visit
Admission: RE | Admit: 2021-01-02 | Discharge: 2021-01-02 | Disposition: A | Discharge status: Home / Self Care | Payer: Medicare Other | Source: Ambulatory Visit | Attending: Family Medicine | Admitting: Family Medicine

## 2021-01-02 ENCOUNTER — Other Ambulatory Visit: Payer: Self-pay

## 2021-01-02 ENCOUNTER — Telehealth: Payer: Self-pay

## 2021-01-02 DIAGNOSIS — Z1231 Encounter for screening mammogram for malignant neoplasm of breast: Secondary | ICD-10-CM | POA: Insufficient documentation

## 2021-01-02 NOTE — Telephone Encounter (Signed)
-----   Message from Erasmo Downer, MD sent at 01/02/2021 12:25 PM EST ----- Normal mammogram. Repeat in 1 yr

## 2021-01-02 NOTE — Telephone Encounter (Signed)
Pt called and verbalized understanding of the information below. 

## 2021-01-19 DIAGNOSIS — M7061 Trochanteric bursitis, right hip: Secondary | ICD-10-CM | POA: Diagnosis not present

## 2021-01-19 DIAGNOSIS — M7062 Trochanteric bursitis, left hip: Secondary | ICD-10-CM | POA: Diagnosis not present

## 2021-01-19 DIAGNOSIS — M5136 Other intervertebral disc degeneration, lumbar region: Secondary | ICD-10-CM | POA: Diagnosis not present

## 2021-01-19 DIAGNOSIS — M48062 Spinal stenosis, lumbar region with neurogenic claudication: Secondary | ICD-10-CM | POA: Diagnosis not present

## 2021-01-31 DIAGNOSIS — M5416 Radiculopathy, lumbar region: Secondary | ICD-10-CM | POA: Diagnosis not present

## 2021-01-31 DIAGNOSIS — M5136 Other intervertebral disc degeneration, lumbar region: Secondary | ICD-10-CM | POA: Diagnosis not present

## 2021-01-31 DIAGNOSIS — M48062 Spinal stenosis, lumbar region with neurogenic claudication: Secondary | ICD-10-CM | POA: Diagnosis not present

## 2021-02-20 ENCOUNTER — Ambulatory Visit: Payer: Medicare Other | Admitting: Dermatology

## 2021-02-24 ENCOUNTER — Telehealth: Payer: Self-pay

## 2021-02-24 NOTE — Telephone Encounter (Signed)
Patient aware that Dr. Beryle Flock is out of the office this afternoon. Patient reports pain has been going for several weeks. Patient reports OTC medications are not helping. Patient is okay waiting on response from Dr. Beryle Flock on Monday.

## 2021-02-24 NOTE — Telephone Encounter (Signed)
Copied from CRM [id number]. Topic: General - Other >> Feb 24, 2021 10:46 AM Pawlus, Jacqueline Daniels wrote: Reason for CRM: Pt wanted to let Dr B know that she is having really bad muscles spasms in her feet and ankles, Pt stated she can not sleep and has to walk around in the middle of the night. Pt wanted to know if Dr B could call something in for her.

## 2021-02-27 NOTE — Telephone Encounter (Signed)
Does PM&R think this is related to her neurogenic claudication that she is being treated for?  Would consider f/u with them to discuss. Also can see her here for OV to assess.

## 2021-02-28 ENCOUNTER — Ambulatory Visit
Admission: RE | Admit: 2021-02-28 | Discharge: 2021-02-28 | Disposition: A | Discharge status: Home / Self Care | Payer: Medicare Other | Source: Ambulatory Visit | Attending: Family Medicine | Admitting: Family Medicine

## 2021-02-28 ENCOUNTER — Ambulatory Visit: Payer: Medicare Other | Admitting: Family Medicine

## 2021-02-28 ENCOUNTER — Encounter: Payer: Self-pay | Admitting: Family Medicine

## 2021-02-28 ENCOUNTER — Other Ambulatory Visit: Payer: Self-pay

## 2021-02-28 VITALS — BP 157/84 | HR 82 | Temp 98.6°F | Wt 133.0 lb

## 2021-02-28 DIAGNOSIS — M25551 Pain in right hip: Secondary | ICD-10-CM | POA: Insufficient documentation

## 2021-02-28 DIAGNOSIS — F4322 Adjustment disorder with anxiety: Secondary | ICD-10-CM

## 2021-02-28 DIAGNOSIS — M545 Low back pain, unspecified: Secondary | ICD-10-CM | POA: Diagnosis not present

## 2021-02-28 DIAGNOSIS — M8949 Other hypertrophic osteoarthropathy, multiple sites: Secondary | ICD-10-CM

## 2021-02-28 DIAGNOSIS — M48062 Spinal stenosis, lumbar region with neurogenic claudication: Secondary | ICD-10-CM | POA: Insufficient documentation

## 2021-02-28 DIAGNOSIS — M5441 Lumbago with sciatica, right side: Secondary | ICD-10-CM | POA: Insufficient documentation

## 2021-02-28 DIAGNOSIS — M47816 Spondylosis without myelopathy or radiculopathy, lumbar region: Secondary | ICD-10-CM | POA: Diagnosis not present

## 2021-02-28 DIAGNOSIS — M159 Polyosteoarthritis, unspecified: Secondary | ICD-10-CM

## 2021-02-28 DIAGNOSIS — G8929 Other chronic pain: Secondary | ICD-10-CM

## 2021-02-28 DIAGNOSIS — M1611 Unilateral primary osteoarthritis, right hip: Secondary | ICD-10-CM | POA: Diagnosis not present

## 2021-02-28 MED ORDER — GABAPENTIN 100 MG PO CAPS: 100.0000 mg | ORAL_CAPSULE | Freq: Every day | ORAL | 3 refills | Status: DC

## 2021-02-28 NOTE — Telephone Encounter (Signed)
Patient seen today in office

## 2021-02-28 NOTE — Progress Notes (Signed)
Established patient visit   Patient: Jacqueline Daniels   DOB: 07/15/1936   85 y.o. Female  MRN: [id number] Visit Date: 02/28/2021  Today's healthcare provider: Shirlee Latch, MD   Chief Complaint  Patient presents with  . Spasms  . Anxiety  . Back Pain  . Fall   Subjective    Anxiety Presents for initial visit. The problem has been gradually worsening. Symptoms include decreased concentration, excessive worry, insomnia and nervous/anxious behavior. Patient reports no confusion, depressed mood, impotence, nausea, shortness of breath or suicidal ideas.    Fall Incident onset: Pt fell on Thanksgiving carrying a [place]. The fall occurred while walking. She landed on concrete. The point of impact was the face (and hands). Pertinent negatives include no nausea.   Pt reports having trouble with muscle spasms in both legs.  She is concerned that is it coming from her back.     Patient Active Problem List   Diagnosis Date Noted  . Status post total shoulder replacement, left 06/06/2020  . Family history of breast cancer in mother 03/18/2017  . Anxiety 02/01/2016  . Atypical migraine 02/01/2016  . White coat syndrome without diagnosis of hypertension 02/01/2016  . Hemorrhoid 02/01/2016  . Intestinal malabsorption 02/01/2016  . Gastro-esophageal reflux disease without esophagitis 02/01/2016  . Skin lesion 02/01/2016  . 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 Hypercholesteremia 09/08/2009  . H/O: hysterectomy 07/21/2009  . Lumbar canal stenosis 07/21/2009  . Osteoarthritis 05/20/2009  . OP (osteoporosis) 07/23/2003   Past Medical History:  Diagnosis Date  . Arthritis   . Bone spur   . GERD (gastroesophageal reflux disease)   . Muscle spasm    Social History   Tobacco Use  . Smoking status: Former Smoker    Packs/day: 1.00    Years: 5.00    Pack years: 5.00    Quit date: 12/23/1973    Years since quitting: 47.2  . Smokeless tobacco:  Never Used  Vaping Use  . Vaping Use: Never used  Substance Use Topics  . Alcohol use: No  . Drug use: No   No Known Allergies   Medications: Outpatient Medications Prior to Visit  Medication Sig  . acetaminophen (TYLENOL) 650 MG CR tablet Take 650 mg by mouth every 8 (eight) hours as needed for pain.  . meloxicam (MOBIC) 15 MG tablet TAKE ONE TABLET BY MOUTH EVERY DAY  . Multiple Vitamins-Minerals (MULTIVITAMIN WITH MINERALS) tablet Take 1 tablet by mouth daily.   No facility-administered medications prior to visit.    Review of Systems  Constitutional: Negative.   Respiratory: Negative.  Negative for shortness of breath.   Cardiovascular: Negative.   Gastrointestinal: Negative.  Negative for nausea.  Genitourinary: Negative for impotence.  Musculoskeletal: Positive for back pain and myalgias. Negative for arthralgias, gait problem, joint swelling, neck pain and neck stiffness.       Muscle spasms bilateral legs.   Psychiatric/Behavioral: Positive for decreased concentration and sleep disturbance. Negative for behavioral problems, confusion, dysphoric mood, hallucinations, self-injury and suicidal ideas. The patient is nervous/anxious and has insomnia. The patient is not hyperactive.         Objective    BP (!) 157/84 (BP Location: Left Arm, Patient Position: Sitting, Cuff Size: Normal)   Pulse 82   Temp 98.6 F (37 C) (Oral)   Wt 133 lb (60.3 kg)   SpO2 99%   BMI 23.56 kg/m    Physical Exam Vitals reviewed.  Constitutional:  General: She is not in acute distress.    Appearance: Normal appearance. She is well-developed. She is not diaphoretic.  HENT:     Head: Normocephalic and atraumatic.  Eyes:     General: No scleral icterus.    Conjunctiva/sclera: Conjunctivae normal.  Neck:     Thyroid: No thyromegaly.  Cardiovascular:     Rate and Rhythm: Normal rate and regular rhythm.     Pulses: Normal pulses.     Heart sounds: Normal heart sounds.   Pulmonary:     Effort: Pulmonary effort is normal. No respiratory distress.     Breath sounds: Normal breath sounds. No wheezing, rhonchi or rales.  Musculoskeletal:     Cervical back: Neck supple.     Right lower leg: No edema.     Left lower leg: No edema.     Comments: Back: No midline TTP, ROM grossly intact.  Mild TTP over R and L lower back/SI joint. Negative SLR bilaterally.  Strength and sensation to light touch intact in lower extremities.  Mild TTP over b/l greater trochanters  Lymphadenopathy:     Cervical: No cervical adenopathy.  Skin:    General: Skin is warm and dry.     Findings: No rash.  Neurological:     Mental Status: She is alert and oriented to person, place, and time. Mental status is at baseline.  Psychiatric:        Mood and Affect: Affect normal. Mood is anxious.        Behavior: Behavior normal.     GAD 7 : Generalized Anxiety Score 02/28/2021  Nervous, Anxious, on Edge 1  Control/stop worrying 1  Worry too much - different things 1  Trouble relaxing 0  Restless 0  Easily annoyed or irritable 1  Afraid - awful might happen 0  Total GAD 7 Score 4  Anxiety Difficulty Not difficult at all      No results found for any visits on 02/28/21.  Assessment & Plan     1. Chronic bilateral low back pain with right-sided sciatica 2. Right hip pain 3. Spinal stenosis of lumbar region with neurogenic claudication 4. Primary osteoarthritis involving multiple joints - longstanding ad intermittent issue, worse in the last few weeks - had ESI by PM&R with minimal improvement - known hip bursitis s/p corticosteroid injections - will get XRays of hip and back to assess degree of OA/DJD -Seems that her leg pain is related to neurogenic claudication and sciatica -Start gabapentin 100 mg nightly and consider dose titration as tolerated -Discussed home exercise program, ice/heat, rest - DG Lumbar Spine Complete; Future - gabapentin (NEURONTIN) 100 MG capsule;  Take 1 capsule (100 mg total) by mouth at bedtime.  Dispense: 30 capsule; Refill: 3 - DG Hip Unilat W OR W/O Pelvis 2-3 Views Right; Future  5. Adjustment disorder with anxious mood -New problem -Related to changes in her health as above -Starting gabapentin may help somewhat with her anxiety and sleep -Avoid other medications at this time -Consider therapy -Return precautions discussed -Contracted for safety  Return for as scheduled, CPE, AWV.      I, Shirlee Latch, MD, have reviewed all documentation for this visit. The documentation on 02/28/21 for the exam, diagnosis, procedures, and orders are all accurate and complete.   Bacigalupo, Marzella Schlein, MD, MPH [facility]

## 2021-03-03 ENCOUNTER — Telehealth: Payer: Self-pay

## 2021-03-03 NOTE — Telephone Encounter (Signed)
-----   Message from Erasmo Downer, MD sent at 03/02/2021  1:36 PM EST ----- No acute changes, but degenerative disc disease (arthritis) is seen

## 2021-04-17 DIAGNOSIS — M5136 Other intervertebral disc degeneration, lumbar region: Secondary | ICD-10-CM | POA: Diagnosis not present

## 2021-04-17 DIAGNOSIS — M48062 Spinal stenosis, lumbar region with neurogenic claudication: Secondary | ICD-10-CM | POA: Diagnosis not present

## 2021-04-17 DIAGNOSIS — M5416 Radiculopathy, lumbar region: Secondary | ICD-10-CM | POA: Diagnosis not present

## 2021-05-11 DIAGNOSIS — M48062 Spinal stenosis, lumbar region with neurogenic claudication: Secondary | ICD-10-CM | POA: Diagnosis not present

## 2021-05-11 DIAGNOSIS — M5136 Other intervertebral disc degeneration, lumbar region: Secondary | ICD-10-CM | POA: Diagnosis not present

## 2021-05-11 DIAGNOSIS — M7061 Trochanteric bursitis, right hip: Secondary | ICD-10-CM | POA: Diagnosis not present

## 2021-05-11 DIAGNOSIS — M5416 Radiculopathy, lumbar region: Secondary | ICD-10-CM | POA: Diagnosis not present

## 2021-05-12 ENCOUNTER — Encounter: Payer: Self-pay | Admitting: Family Medicine

## 2021-05-12 ENCOUNTER — Other Ambulatory Visit: Payer: Self-pay

## 2021-05-12 ENCOUNTER — Ambulatory Visit: Payer: Medicare Other | Admitting: Family Medicine

## 2021-05-12 VITALS — BP 134/76 | HR 64 | Temp 97.9°F | Resp 16 | Ht 63.0 in | Wt 136.0 lb

## 2021-05-12 DIAGNOSIS — R319 Hematuria, unspecified: Secondary | ICD-10-CM

## 2021-05-12 DIAGNOSIS — R300 Dysuria: Secondary | ICD-10-CM | POA: Diagnosis not present

## 2021-05-12 DIAGNOSIS — N390 Urinary tract infection, site not specified: Secondary | ICD-10-CM | POA: Diagnosis not present

## 2021-05-12 DIAGNOSIS — R233 Spontaneous ecchymoses: Secondary | ICD-10-CM

## 2021-05-12 DIAGNOSIS — R350 Frequency of micturition: Secondary | ICD-10-CM

## 2021-05-12 DIAGNOSIS — R238 Other skin changes: Secondary | ICD-10-CM

## 2021-05-12 LAB — POCT URINALYSIS DIPSTICK
Bilirubin, UA: NEGATIVE
Glucose, UA: NEGATIVE
Nitrite, UA: NEGATIVE
Protein, UA: POSITIVE — AB
Spec Grav, UA: >=1.03 — AB (ref 1.010–1.025)
Urobilinogen, UA: 0.2 E.U./dL
pH, UA: 6.5 (ref 5.0–8.0)

## 2021-05-12 MED ORDER — CEPHALEXIN 500 MG PO CAPS: 500.0000 mg | ORAL_CAPSULE | Freq: Two times a day (BID) | ORAL | 0 refills | Status: AC

## 2021-05-12 NOTE — Patient Instructions (Signed)
Please go to the lab draw station in [address] on the second floor of [facility]. Normal hours are 8:00am to 11:30am and 1:00pm to 4:00pm Monday through Friday  

## 2021-05-12 NOTE — Progress Notes (Signed)
      Established patient visit   Patient: Jacqueline Daniels   DOB: 29-Jun-1936   85 y.o. Female  MRN: [id number] Visit Date: 05/12/2021  Today's healthcare provider: Mila Merry, MD   Chief Complaint  Patient presents with  . Urinary Tract Infection  . easy bruising    Subjective    Urinary Frequency  This is a new problem. The current episode started today. The problem has been gradually worsening. The patient is experiencing no pain. Associated symptoms include frequency and urgency. Pertinent negatives include no hematuria. She has tried nothing for the symptoms.    She also points out several ecchymotic on upper extremities and is concerned about bruising easily.       Medications: Outpatient Medications Prior to Visit  Medication Sig  . acetaminophen (TYLENOL) 650 MG CR tablet Take 650 mg by mouth every 8 (eight) hours as needed for pain.  Marland Kitchen gabapentin (NEURONTIN) 100 MG capsule Take 1 capsule (100 mg total) by mouth at bedtime.  . meloxicam (MOBIC) 15 MG tablet TAKE ONE TABLET BY MOUTH EVERY DAY  . Multiple Vitamins-Minerals (MULTIVITAMIN WITH MINERALS) tablet Take 1 tablet by mouth daily.   No facility-administered medications prior to visit.    Review of Systems  Genitourinary: Positive for frequency and urgency. Negative for dysuria and hematuria.  Hematological: Bruises/bleeds easily.       Objective    BP 134/76   Pulse 64   Temp 97.9 F (36.6 C)   Resp 16   Ht 5\' 3"  (1.6 m)   Wt 136 lb (61.7 kg)   BMI 24.09 kg/m     Physical Exam   General appearance: Well developed, well nourished female, cooperative and in no acute distress  Skin: See HPI Psych: Appropriate mood and affect. Neurologic: Mental status: Alert, oriented to person, place, and time, thought content appropriate.   Results for orders placed or performed in visit on 05/12/21  POCT Urinalysis Dipstick  Result Value Ref Range   Color, UA yellow    Clarity, UA cloudy    Glucose, UA  Negative Negative   Bilirubin, UA negative    Ketones, UA small    Spec Grav, UA >=1.030 (A) 1.010 - 1.025   Blood, UA hemolyzed large    pH, UA 6.5 5.0 - 8.0   Protein, UA Positive (A) Negative   Urobilinogen, UA 0.2 0.2 or 1.0 E.U./dL   Nitrite, UA negative    Leukocytes, UA Small (1+) (A) Negative     Assessment & Plan     1. Dysuria   2. Urinary frequency   3. Urinary tract infection with hematuria, site unspecified  - cephALEXin (KEFLEX) 500 MG capsule; Take 1 capsule (500 mg total) by mouth 2 (two) times daily for 5 days.  Dispense: 10 capsule; Refill: 0 - Urine Culture  4. Abnormal bruising  - CBC - PT and PTT         The entirety of the information documented in the History of Present Illness, Review of Systems and Physical Exam were personally obtained by me. Portions of this information were initially documented by the CMA and reviewed by me for thoroughness and accuracy.      05/14/21, MD  [facility] [phone] (phone) [phone] (fax)  [facility]

## 2021-05-13 LAB — PT AND PTT
INR: 0.9 (ref 0.9–1.2)
Prothrombin Time: 9.8 s (ref 9.1–12.0)
aPTT: 26 s (ref 24–33)

## 2021-05-13 LAB — CBC
Hematocrit: 36.6 % (ref 34.0–46.6)
Hemoglobin: 12.3 g/dL (ref 11.1–15.9)
MCH: 31.9 pg (ref 26.6–33.0)
MCHC: 33.6 g/dL (ref 31.5–35.7)
MCV: 95 fL (ref 79–97)
Platelets: 291 10*3/uL (ref 150–450)
RBC: 3.86 x10E6/uL (ref 3.77–5.28)
RDW: 11.6 % — ABNORMAL LOW (ref 11.7–15.4)
WBC: 8.4 10*3/uL (ref 3.4–10.8)

## 2021-05-16 DIAGNOSIS — H5203 Hypermetropia, bilateral: Secondary | ICD-10-CM | POA: Diagnosis not present

## 2021-05-17 LAB — URINE CULTURE

## 2021-06-02 ENCOUNTER — Other Ambulatory Visit: Payer: Self-pay | Admitting: Family Medicine

## 2021-06-02 DIAGNOSIS — M5441 Lumbago with sciatica, right side: Secondary | ICD-10-CM

## 2021-06-02 DIAGNOSIS — M48062 Spinal stenosis, lumbar region with neurogenic claudication: Secondary | ICD-10-CM

## 2021-06-02 DIAGNOSIS — G8929 Other chronic pain: Secondary | ICD-10-CM

## 2021-06-12 DIAGNOSIS — M7542 Impingement syndrome of left shoulder: Secondary | ICD-10-CM | POA: Diagnosis not present

## 2021-06-12 DIAGNOSIS — M545 Low back pain, unspecified: Secondary | ICD-10-CM | POA: Diagnosis not present

## 2021-06-27 DIAGNOSIS — J189 Pneumonia, unspecified organism: Secondary | ICD-10-CM | POA: Diagnosis not present

## 2021-06-27 DIAGNOSIS — R059 Cough, unspecified: Secondary | ICD-10-CM | POA: Diagnosis not present

## 2021-06-27 DIAGNOSIS — Z9181 History of falling: Secondary | ICD-10-CM | POA: Diagnosis not present

## 2021-06-28 ENCOUNTER — Telehealth: Payer: Self-pay

## 2021-06-28 NOTE — Telephone Encounter (Signed)
Zithromax remains in the lungs for 5 days after you finish it, so a 3 day prescription is really like 8 days.  You can put her in at 10:20 on Friday for a follow up.

## 2021-06-28 NOTE — Telephone Encounter (Signed)
Pt called and and stated she would like a refill of azithromycin sent to [facility].  She is worried that three days was not long enough and she is going to get worse again.   Pt states she is very weak/tired, coughing, and wheezing.  She denies fevers, shortness of breath.  She states her wheezing has improved some as well.    Thanks,   Vernona Rieger

## 2021-06-28 NOTE — Telephone Encounter (Signed)
Copied from CRM [id number]. Topic: Appointment Scheduling - Scheduling Inquiry for Clinic >> Jun 28, 2021 10:38 AM Jacqueline Daniels A wrote: Reason for CRM: Patient would like to be seen for a hospital follow up   The agent was unable to successfully schedule the patient at the time of call for the earliest available appt 07/11/21  Please contact to further advise when possible

## 2021-06-28 NOTE — Telephone Encounter (Signed)
Pt was seen in

## 2021-06-29 NOTE — Telephone Encounter (Signed)
Pt called back in to follow up on previous message sent. Made pt aware of advise from provider. Pt says that she will be at apt tomorrow. Pt also stated that someone that she spoke to previously gave her the impression that a Rx would be sent in to pharmacy. Pt says that her pharmacy hasn't received any Rx from her PCP.   Pt would like a call back from provider's assistant to discuss further.

## 2021-06-29 NOTE — Telephone Encounter (Signed)
Pt advised.   Thanks,   -Shaquel Josephson  

## 2021-06-30 ENCOUNTER — Other Ambulatory Visit: Payer: Self-pay

## 2021-06-30 ENCOUNTER — Telehealth: Payer: Self-pay

## 2021-06-30 ENCOUNTER — Ambulatory Visit
Admission: RE | Admit: 2021-06-30 | Discharge: 2021-06-30 | Disposition: A | Discharge status: Home / Self Care | Payer: Medicare Other | Attending: Family Medicine | Admitting: Family Medicine

## 2021-06-30 ENCOUNTER — Encounter: Payer: Self-pay | Admitting: Family Medicine

## 2021-06-30 ENCOUNTER — Ambulatory Visit
Admission: RE | Admit: 2021-06-30 | Discharge: 2021-06-30 | Disposition: A | Discharge status: Home / Self Care | Payer: Medicare Other | Source: Ambulatory Visit | Attending: Family Medicine | Admitting: Family Medicine

## 2021-06-30 ENCOUNTER — Ambulatory Visit: Payer: Medicare Other | Admitting: Family Medicine

## 2021-06-30 VITALS — BP 160/82 | HR 77 | Temp 97.6°F | Resp 18 | Wt 131.2 lb

## 2021-06-30 DIAGNOSIS — R0602 Shortness of breath: Secondary | ICD-10-CM | POA: Diagnosis not present

## 2021-06-30 DIAGNOSIS — R059 Cough, unspecified: Secondary | ICD-10-CM

## 2021-06-30 DIAGNOSIS — J40 Bronchitis, not specified as acute or chronic: Secondary | ICD-10-CM

## 2021-06-30 MED ORDER — CEFDINIR 300 MG PO CAPS: 600.0000 mg | ORAL_CAPSULE | Freq: Every day | ORAL | 0 refills | Status: AC

## 2021-06-30 MED ORDER — HYDROCODONE BIT-HOMATROP MBR 5-1.5 MG/5ML PO SOLN: 5.0000 mL | Freq: Three times a day (TID) | ORAL | 0 refills | Status: DC | PRN

## 2021-06-30 NOTE — Telephone Encounter (Signed)
Patient advised of results and verbalized understanding. [facility] called in a few moments ago stating that they didn't have the cough syrup in stock. Patient is fine [facility] [address] for [facility] syrup. [facility] resend to [facility] [address]

## 2021-06-30 NOTE — Telephone Encounter (Signed)
Copied from CRM [id number]. Topic: General - Other >> Jun 30, 2021  5:18 PM Eliseo Gum, Deedra Ehrich wrote: Reason for CRM: moreno from [facility], called in to report the HYDROcodone bit-homatropine (HYCODAN) 5-1.5 MG/5ML syrup  is not available with them, but she believes [facility] at [facility] [address], [place]. Call back # is [phone]

## 2021-06-30 NOTE — Telephone Encounter (Signed)
Patient was seen today. Was suppose to get a cough medicine called in but patient said it hasnt been done.  [facility]

## 2021-06-30 NOTE — Telephone Encounter (Signed)
I don't see any pneumonia on the Xray, I thinks its just bad bronchitis. Have sent prescription for cefdinir for bronchitis and a cough syrup to [facility]. Will check on her Monday when get report from radiologist.

## 2021-06-30 NOTE — Progress Notes (Signed)
Established patient visit   Patient: Jacqueline Daniels   DOB: Aug 21, 1936   85 y.o. Female  MRN: [id number] Visit Date: 06/30/2021  Today's healthcare provider: Mila Merry, MD   Chief Complaint  Patient presents with   Pneumonia   Subjective    HPI  Follow up for pneumonia:  The patient was last seen for this 3 days ago (06/27/2021) at [facility] in [place], [place]. During that visit, patient was prescribed an albuterol inhaler, prednisone taper and zpack.   She reports good compliance with treatment. She feels that condition is Unchanged. Patient has completed all doses of the z pack. She is still taking prednisone and taking Albuterol inhaler as prescribed. Patient doesn't feel any better today. She is not having side effects.   -----------------------------------------------------------------------------------------      Medications: Outpatient Medications Prior to Visit  Medication Sig   acetaminophen (TYLENOL) 650 MG CR tablet Take 650 mg by mouth every 8 (eight) hours as needed for pain.   gabapentin (NEURONTIN) 100 MG capsule TAKE ONE CAPSULE AT BEDTIME   meloxicam (MOBIC) 15 MG tablet TAKE ONE TABLET BY MOUTH EVERY DAY   Multiple Vitamins-Minerals (MULTIVITAMIN WITH MINERALS) tablet Take 1 tablet by mouth daily.   predniSONE (DELTASONE) 20 MG tablet 3 tab x 3 d, 2 tabs x 3 d, 1 tab x 3 d. Divided dosing daily   albuterol (VENTOLIN HFA) 108 (90 Base) MCG/ACT inhaler Inhale 2 puffs into the lungs every 6 (six) hours as needed.   No facility-administered medications prior to visit.    Review of Systems  Constitutional:  Positive for fatigue. Negative for appetite change, chills, diaphoresis and fever.  Respiratory:  Positive for cough (productive with clear phlegm) and wheezing. Negative for chest tightness and shortness of breath.   Cardiovascular:  Negative for chest pain and palpitations.  Gastrointestinal:  Negative for  abdominal pain, nausea and vomiting.  Neurological:  Positive for weakness. Negative for dizziness.      Objective    BP (!) 160/82 (BP Location: Right Arm, Patient Position: Sitting)   Pulse 77   Temp 97.6 F (36.4 C) (Temporal)   Resp 18   Wt 131 lb 3.2 oz (59.5 kg)   SpO2 95% Comment: room air  BMI 23.24 kg/m     Physical Exam   General: Appearance:    Well developed, well nourished female in no acute distress  Eyes:    PERRL, conjunctiva/corneas clear, EOM's intact       Lungs:     Clear to auscultation bilaterally, respirations unlabored  Heart:    Normal heart rate. Normal rhythm. No murmurs, rubs, or gallops.    MS:   All extremities are intact.    Neurologic:   Awake, alert, oriented x 3. No apparent focal neurological defect.      Cxr -  New small nodular opacity is noted anteriorly in retrosternal position on lateral radiograph, concerning for possible pulmonary nodule  Assessment & Plan     1. Cough  - albuterol (VENTOLIN HFA) 108 (90 Base) MCG/ACT inhaler; Inhale 2 puffs into the lungs every 6 (six) hours as needed. - predniSONE (DELTASONE) 20 MG tablet; 3 tab x 3 d, 2 tabs x 3 d, 1 tab x 3 d. Divided dosing daily   Nodule opacity on Xray may be sequela of pneumonia. Will repeat chest Xray in 1 month.       The entirety of the information documented in the History  of Present Illness, Review of Systems and Physical Exam were personally obtained by me. Portions of this information were initially documented by the CMA and reviewed by me for thoroughness and accuracy.     Mila Merry, MD  [facility] [phone] (phone) [phone] (fax)  [facility]

## 2021-07-03 ENCOUNTER — Telehealth: Payer: Self-pay | Admitting: Family Medicine

## 2021-07-03 NOTE — Telephone Encounter (Addendum)
Traci with [facility] called to ask if anyone reached out to her about the call report. She says not yet. I advised the provider has seen it and made a note on it and the patient was called, but not answer. She says as long as the provider is aware, that's what she needs.

## 2021-07-03 NOTE — Telephone Encounter (Signed)
Caller name: Traci  Relation to pt: from [facility]  Call back number: [phone]    Reason for call:  Call report regarding chest x ray results.

## 2021-07-03 NOTE — Telephone Encounter (Signed)
Just got this message. Does patient still want cough syrup sent to other pharmacy.

## 2021-07-04 MED ORDER — HYDROCODONE BIT-HOMATROP MBR 5-1.5 MG/5ML PO SOLN: 5.0000 mL | Freq: Three times a day (TID) | ORAL | 0 refills | Status: DC | PRN

## 2021-07-04 NOTE — Telephone Encounter (Signed)
I called and spoke with patient. She still wants the prescription for cough syrup. She would like [place] to send it to [facility] [address].   Patient states she is feeling a little better, but is not 100% better. Patient has 3 more days of antibiotics left. She states she is going to call us back on Friday if  symptoms aren't significantly better.

## 2021-07-05 ENCOUNTER — Telehealth: Payer: Self-pay

## 2021-07-05 DIAGNOSIS — R911 Solitary pulmonary nodule: Secondary | ICD-10-CM

## 2021-07-05 DIAGNOSIS — F439 Reaction to severe stress, unspecified: Secondary | ICD-10-CM

## 2021-07-05 MED ORDER — PAROXETINE HCL 20 MG PO TABS: ORAL_TABLET | ORAL | 1 refills | Status: DC

## 2021-07-05 NOTE — Telephone Encounter (Signed)
Pt decided to proceed with the CT Scan.  She would like it done as soon as she can.   Thanks,   -Vernona Rieger

## 2021-07-05 NOTE — Telephone Encounter (Signed)
Please review... Do you need to see her for this?   Thanks,   -Blaze Nylund  

## 2021-07-05 NOTE — Telephone Encounter (Signed)
Copied from CRM [id number]. Topic: General - Other >> Jul 04, 2021  3:12 PM Payton Spark N wrote: Reason for CRM: Pt states husband is going into a nursing facility and states she has been very stressed lately and wanted to see about getting put on a medication to help her relax. Please advise.   [facility] - [place], [place] - [address]  Phone:  [phone] Fax:  [phone]

## 2021-07-05 NOTE — Telephone Encounter (Signed)
Have sent prescription for paroxetine to [facility]. Schedule follow up in 4 weeks.

## 2021-07-05 NOTE — Telephone Encounter (Signed)
She was previously given option of doing chest CT scan now, or repeating chest Xray in 2-3 weeks and was told she want to repeat chest XR in 2-3 weeks.... did she change her mind?

## 2021-07-05 NOTE — Telephone Encounter (Signed)
Copied from CRM [id number]. Topic: General - Other >> Jul 05, 2021 10:10 AM Glean Salen wrote: Reason for [id number]:[id number] called in about Dr Sherrie Mustache getting scan set up for her for the nodule found in her chest. Please call back

## 2021-07-05 NOTE — Telephone Encounter (Signed)
Order placed for chest CT 

## 2021-07-07 NOTE — Telephone Encounter (Signed)
LMTCB 07/07/2021.  [facility] Please advise pt below and schedule a four week follow up.   Thanks,   -Vernona Rieger

## 2021-07-07 NOTE — Telephone Encounter (Signed)
Patient has returned call from practice  Patient shares that they would like to discuss scheduling a "scan of their nodule" before they schedule a four week follow up  Please contact further

## 2021-07-13 NOTE — Telephone Encounter (Signed)
Looks like CT chest has been scheduled for 07/25/2021 at 8am. I tried calling patient to make sure she was aware of this appointment. Left message to call back. Ok for [facility] to speak with patient and advise of CT chest appointment.

## 2021-07-14 NOTE — Telephone Encounter (Signed)
Call to patinet- she is aware of the appointment for CT and she states she does not need appointment at this time time- will await provider review and call back.

## 2021-07-17 DIAGNOSIS — M5416 Radiculopathy, lumbar region: Secondary | ICD-10-CM | POA: Insufficient documentation

## 2021-07-25 ENCOUNTER — Other Ambulatory Visit: Payer: Self-pay

## 2021-07-25 ENCOUNTER — Ambulatory Visit
Admission: RE | Admit: 2021-07-25 | Discharge: 2021-07-25 | Disposition: A | Discharge status: Home / Self Care | Payer: Medicare Other | Source: Ambulatory Visit | Attending: Family Medicine | Admitting: Family Medicine

## 2021-07-25 DIAGNOSIS — I700 Atherosclerosis of aorta: Secondary | ICD-10-CM | POA: Diagnosis not present

## 2021-07-25 DIAGNOSIS — R911 Solitary pulmonary nodule: Secondary | ICD-10-CM | POA: Insufficient documentation

## 2021-07-26 DIAGNOSIS — M5416 Radiculopathy, lumbar region: Secondary | ICD-10-CM | POA: Diagnosis not present

## 2021-08-04 DIAGNOSIS — M5416 Radiculopathy, lumbar region: Secondary | ICD-10-CM | POA: Diagnosis not present

## 2021-08-24 ENCOUNTER — Other Ambulatory Visit: Payer: Self-pay | Admitting: Family Medicine

## 2021-08-24 DIAGNOSIS — M48062 Spinal stenosis, lumbar region with neurogenic claudication: Secondary | ICD-10-CM

## 2021-08-24 DIAGNOSIS — G8929 Other chronic pain: Secondary | ICD-10-CM

## 2021-08-24 NOTE — Telephone Encounter (Signed)
Valid encounter . Future visit in 3 months  

## 2021-08-28 ENCOUNTER — Other Ambulatory Visit: Payer: Self-pay | Admitting: Family Medicine

## 2021-08-28 DIAGNOSIS — F439 Reaction to severe stress, unspecified: Secondary | ICD-10-CM

## 2021-09-18 ENCOUNTER — Ambulatory Visit: Payer: Self-pay

## 2021-09-18 NOTE — Telephone Encounter (Signed)
Pt with h/o chronic back pain called stated her pain is severe and constant. Pt stated the pain is worse with walking. Pt stated that she has tried acetaminophen, aleve and Mobic and nothing is helping. She gets brief relief with ice.  Pt requesting a medication that can be prescribed that she can take daily because the Mobic is not working. Pt stated that she does have numbness to bilateral lower legs on an intermittent basis.  Pt in pain and wanting relief. Pt has not and does not want to take her narcotic pain medication. Advised pt to at least take half of a tablet and take 2 Aleve at the same time. If no relief in 1 hour then she can see if she gets some relief by taking the other half. Pt verbalized understanding. Also advised [employer] (per Champion) and ice/warm for comfort.  Routing to [facility] for review.     Reason for Disposition  [1] SEVERE back pain (e.g., excruciating, unable to do any normal activities) AND [2] not improved 2 hours after pain medicine  Answer Assessment - Initial Assessment Questions 1. ONSET: "When did the pain begin?"      chronic 2. LOCATION: "Where does it hurt?" (upper, mid or lower back)     Lower back 3. SEVERITY: "How bad is the pain?"  (e.g., Scale 1-10; mild, moderate, or severe)   - MILD (1-3): doesn't interfere with normal activities    - MODERATE (4-7): interferes with normal activities or awakens from sleep    - SEVERE (8-10): excruciating pain, unable to do any normal activities      Tylenol, aleve  and Mobic are ineffective. -severe 4. PATTERN: "Is the pain constant?" (e.g., yes, no; constant, intermittent)      yes 5. RADIATION: "Does the pain shoot into your legs or elsewhere?"     Yes down to buttocks 6. CAUSE:  "What do you think is causing the back pain?"      arthritis 7. BACK OVERUSE:  "Any recent lifting of heavy objects, strenuous work or exercise?"     no 8. MEDICATIONS: "What have you taken so far for the pain?" (e.g., nothing,  acetaminophen, NSAIDS)     Mobic, tylenol,Aleve not working 9. NEUROLOGIC SYMPTOMS: "Do you have any weakness, numbness, or problems with bowel/bladder control?"     Numbness knee down 10. OTHER SYMPTOMS: "Do you have any other symptoms?" (e.g., fever, abdominal pain, burning with urination, blood in urine)       no 11. PREGNANCY: "Is there any chance you are pregnant?" (e.g., yes, no; LMP)       N/a  Protocols used: Back Pain-A-AH

## 2021-09-19 ENCOUNTER — Encounter: Payer: Self-pay | Admitting: Family Medicine

## 2021-09-19 ENCOUNTER — Ambulatory Visit: Payer: Self-pay | Admitting: *Deleted

## 2021-09-19 DIAGNOSIS — R059 Cough, unspecified: Secondary | ICD-10-CM

## 2021-09-19 DIAGNOSIS — M545 Low back pain, unspecified: Secondary | ICD-10-CM

## 2021-09-19 NOTE — Telephone Encounter (Signed)
Pt states she took 2 Alebes and 1/2 hydrocodone last night and again today. States has been pain free. Pt states she is thrilled these are working for her back pain but concerned about being on meds long term. States "I worry about what this does to my organs, but if Dr. Sherrie Mustache thinks to stay on it is ok I will." Assured pt Nt would route to practice for PCPs review.  Pt also states if she is to continue on the hydrocodone, she will need refill, has 4 tabs left. Please advise: [phone] Reason for Disposition . [1] Caller has NON-URGENT medicine question about med that PCP prescribed AND [2] triager unable to answer question  Answer Assessment - Initial Assessment Questions 1. NAME of MEDICATION: "What medicine are you calling about?"     Hydrocodone 2. QUESTION: "What is your question?" (e.g., double dose of medicine, side effect)     *No Answer* 3. PRESCRIBING HCP: "Who prescribed it?" Reason: if prescribed by specialist, call should be referred to that group.     *No Answer* 4. SYMPTOMS: "Do you have any symptoms?"     *No Answer* 5. SEVERITY: If symptoms are present, ask "Are they mild, moderate or severe?"     *No Answer* 6. PREGNANCY:  "Is there any chance that you are pregnant?" "When was your last menstrual period?"     *No Answer*  Protocols used: Medication Question Call-A-AH

## 2021-09-19 NOTE — Telephone Encounter (Signed)
ERROR

## 2021-09-19 NOTE — Telephone Encounter (Signed)
Summary: Medication Management   Patient states she was advised by her pharmacist to take 2 Aleves and was advised by PCP to take 1/2 of HYDROcodone for her back pain. Patient was advised to follow up on how she was doing and she stated the medication is working.  Patient unsure if she should continue to take medication as recommend and if so she will run out of HYDROcodone.

## 2021-09-20 NOTE — Telephone Encounter (Addendum)
She should be fine taking 2 Aleve once or twice a day. Taking an excessive amount can cause kidney problems, but her kidney have always been very good. Should just be sure to have kidney functions check every 3-6 months. Too much hydrocodone can cause constipation, but otherwise is safe to take.

## 2021-09-20 NOTE — Addendum Note (Signed)
Addended by: Kavin Leech E on: 09/20/2021 03:39 PM   Modules accepted: Orders

## 2021-09-20 NOTE — Telephone Encounter (Signed)
Pt advised. She says she needs refills on hydrocodone.  Please send to [facility].   Thanks,   -Vernona Rieger

## 2021-09-21 MED ORDER — HYDROCODONE-ACETAMINOPHEN 5-325 MG PO TABS: 1.0000 | ORAL_TABLET | Freq: Three times a day (TID) | ORAL | 0 refills | Status: DC | PRN

## 2021-09-21 NOTE — Addendum Note (Signed)
Addended by: Malva Limes on: 09/21/2021 07:46 AM   Modules accepted: Orders

## 2021-10-09 ENCOUNTER — Ambulatory Visit: Payer: Self-pay

## 2021-10-09 NOTE — Telephone Encounter (Signed)
Pt. Reports dizziness x 1 week. Worse when she gets up in the morning. "Once I'm up, I do OK." No availability until the end of the month. Reports she will see any provider. Would like to be worked in.Please advise. May leave detailed message on voice mail.    Reason for Disposition  [1] MODERATE dizziness (e.g., interferes with normal activities) AND [2] has NOT been evaluated by physician for this  (Exception: dizziness caused by heat exposure, sudden standing, or poor fluid intake)  Answer Assessment - Initial Assessment Questions 1. DESCRIPTION: "Describe your dizziness."     Dizzy 2. LIGHTHEADED: "Do you feel lightheaded?" (e.g., somewhat faint, woozy, weak upon standing)     Yes 3. VERTIGO: "Do you feel like either you or the room is spinning or tilting?" (i.e. vertigo)     No 4. SEVERITY: "How bad is it?"  "Do you feel like you are going to faint?" "Can you stand and walk?"   - MILD: Feels slightly dizzy, but walking normally.   - MODERATE: Feels unsteady when walking, but not falling; interferes with normal activities (e.g., school, work).   - SEVERE: Unable to walk without falling, or requires assistance to walk without falling; feels like passing out now.      Moderate 5. ONSET:  "When did the dizziness begin?"     Last week 6. AGGRAVATING FACTORS: "Does anything make it worse?" (e.g., standing, change in head position)     Getting up out of bed 7. HEART RATE: "Can you tell me your heart rate?" "How many beats in 15 seconds?"  (Note: not all patients can do this)       No 8. CAUSE: "What do you think is causing the dizziness?"     Unsure 9. RECURRENT SYMPTOM: "Have you had dizziness before?" If Yes, ask: "When was the last time?" "What happened that time?"     Yes 10. OTHER SYMPTOMS: "Do you have any other symptoms?" (e.g., fever, chest pain, vomiting, diarrhea, bleeding)       No 11. PREGNANCY: "Is there any chance you are pregnant?" "When was your last menstrual period?"        No  Protocols used: Dizziness - Lightheadedness-A-AH

## 2021-10-09 NOTE — Telephone Encounter (Signed)
I can see her in the acute visit slot at 11:20 Wednesday of 11:20 Friday.

## 2021-10-11 ENCOUNTER — Other Ambulatory Visit: Payer: Self-pay

## 2021-10-11 ENCOUNTER — Encounter: Payer: Self-pay | Admitting: Family Medicine

## 2021-10-11 ENCOUNTER — Ambulatory Visit: Payer: Medicare Other | Admitting: Family Medicine

## 2021-10-11 VITALS — BP 160/82 | HR 84 | Temp 98.4°F | Wt 131.0 lb

## 2021-10-11 DIAGNOSIS — R42 Dizziness and giddiness: Secondary | ICD-10-CM

## 2021-10-11 DIAGNOSIS — Z23 Encounter for immunization: Secondary | ICD-10-CM | POA: Diagnosis not present

## 2021-10-11 DIAGNOSIS — R270 Ataxia, unspecified: Secondary | ICD-10-CM | POA: Diagnosis not present

## 2021-10-11 NOTE — Patient Instructions (Signed)
Please review the attached list of medications and notify my office if there are any errors.   Please go to the lab draw station in [address] on the second floor of [facility]. Normal hours are 8:00am to 11:30am and 1:00pm to 4:00pm Monday through Friday  

## 2021-10-11 NOTE — Progress Notes (Signed)
Established patient visit   Patient: Jacqueline Daniels   DOB: 1936-03-11   85 y.o. Female  MRN: [id number] Visit Date: 10/11/2021  Today's healthcare provider: Mila Merry, MD   Chief Complaint  Patient presents with   Dizziness   Subjective    Dizziness This is a new problem. Episode onset: 1 week ago. Associated symptoms include nausea. Pertinent negatives include no abdominal pain, chest pain, chills, fatigue, fever, vomiting or weakness.   Dizziness occurs when she wakes up in the mornings to stand and get out of bed.  States that on the first day it occurred she fell back down on the bed three times due to dizziness. It is not a spinning sensation and not a light headed feeling. It improves after she gets up and around, but has episodes of feeling off balance throughout the day. She does report history of inner dizziness, but has not had for several years.     Medications: Outpatient Medications Prior to Visit  Medication Sig   acetaminophen (TYLENOL) 650 MG CR tablet Take 650 mg by mouth every 8 (eight) hours as needed for pain.   albuterol (VENTOLIN HFA) 108 (90 Base) MCG/ACT inhaler Inhale 2 puffs into the lungs every 6 (six) hours as needed.   gabapentin (NEURONTIN) 100 MG capsule TAKE ONE CAPSULE AT BEDTIME   HYDROcodone-acetaminophen (NORCO/VICODIN) 5-325 MG tablet Take 1 tablet by mouth every 8 (eight) hours as needed for moderate pain.   meloxicam (MOBIC) 15 MG tablet TAKE ONE TABLET BY MOUTH EVERY DAY   Multiple Vitamins-Minerals (MULTIVITAMIN WITH MINERALS) tablet Take 1 tablet by mouth daily.   PARoxetine (PAXIL) 20 MG tablet Take 1 tablet (20 mg total) by mouth daily.   predniSONE (DELTASONE) 20 MG tablet 3 tab x 3 d, 2 tabs x 3 d, 1 tab x 3 d. Divided dosing daily   No facility-administered medications prior to visit.    Review of Systems  Constitutional:  Negative for appetite change, chills, fatigue and fever.  Respiratory:  Negative for chest tightness  and shortness of breath.   Cardiovascular:  Negative for chest pain and palpitations.  Gastrointestinal:  Positive for nausea. Negative for abdominal pain and vomiting.  Neurological:  Positive for dizziness. Negative for weakness.      Objective    BP (!) 160/82 (BP Location: Left Arm, Patient Position: Sitting, Cuff Size: Normal)   Pulse 84   Temp 98.4 F (36.9 C) (Oral)   Wt 131 lb (59.4 kg)   SpO2 100% Comment: room air  BMI 23.21 kg/m  {Show previous vital signs (optional):23777}  Physical Exam    General: Appearance:    Well developed, well nourished female in no acute distress  Eyes:    PERRL, conjunctiva/corneas clear, EOM's intact       Ears:     Excessive cerumen both ear canals.   Heart:    Normal heart rate. Normal rhythm. No murmurs, rubs, or gallops.    MS:   All extremities are intact.    Neurologic:   Awake, alert, oriented x 3. No nystagmus, Normal finger to nose. Slightly unsteady gait. She reports she doesn't really feel dizzy at this time.         Assessment & Plan     1. Ataxia Concerning for TIA. Not typical for orthostasis or vertigo.  - CT HEAD W CONTRAST ( ); Future  2. Dizziness  - Comprehensive metabolic panel - CBC  The entirety of the information documented in the History of Present Illness, Review of Systems and Physical Exam were personally obtained by me. Portions of this information were initially documented by the CMA and reviewed by me for thoroughness and accuracy.     Yen Wandell, MD  [facility] [phone] (phone) [phone] (fax)  [facility]  

## 2021-10-12 DIAGNOSIS — R42 Dizziness and giddiness: Secondary | ICD-10-CM | POA: Diagnosis not present

## 2021-10-12 DIAGNOSIS — Z23 Encounter for immunization: Secondary | ICD-10-CM

## 2021-10-12 DIAGNOSIS — R270 Ataxia, unspecified: Secondary | ICD-10-CM | POA: Diagnosis not present

## 2021-10-13 LAB — COMPREHENSIVE METABOLIC PANEL
ALT: 12 IU/L (ref 0–32)
AST: 21 IU/L (ref 0–40)
Albumin/Globulin Ratio: 1.8 (ref 1.2–2.2)
Albumin: 4.3 g/dL (ref 3.6–4.6)
Alkaline Phosphatase: 84 IU/L (ref 44–121)
BUN/Creatinine Ratio: 15 (ref 12–28)
BUN: 16 mg/dL (ref 8–27)
Bilirubin Total: 0.3 mg/dL (ref 0.0–1.2)
CO2: 25 mmol/L (ref 20–29)
Calcium: 9.6 mg/dL (ref 8.7–10.3)
Chloride: 103 mmol/L (ref 96–106)
Creatinine, Ser: 1.05 mg/dL — ABNORMAL HIGH (ref 0.57–1.00)
Globulin, Total: 2.4 g/dL (ref 1.5–4.5)
Glucose: 87 mg/dL (ref 70–99)
Potassium: 4.5 mmol/L (ref 3.5–5.2)
Sodium: 141 mmol/L (ref 134–144)
Total Protein: 6.7 g/dL (ref 6.0–8.5)
eGFR: 53 mL/min/{1.73_m2} — ABNORMAL LOW (ref >59)

## 2021-10-13 LAB — CBC
Hematocrit: 38.5 % (ref 34.0–46.6)
Hemoglobin: 13 g/dL (ref 11.1–15.9)
MCH: 31.6 pg (ref 26.6–33.0)
MCHC: 33.8 g/dL (ref 31.5–35.7)
MCV: 93 fL (ref 79–97)
Platelets: 311 10*3/uL (ref 150–450)
RBC: 4.12 x10E6/uL (ref 3.77–5.28)
RDW: 11.7 % (ref 11.7–15.4)
WBC: 6.4 10*3/uL (ref 3.4–10.8)

## 2021-10-25 ENCOUNTER — Other Ambulatory Visit: Payer: Self-pay | Admitting: Family Medicine

## 2021-10-25 ENCOUNTER — Ambulatory Visit
Admission: RE | Admit: 2021-10-25 | Discharge: 2021-10-25 | Disposition: A | Discharge status: Home / Self Care | Payer: Medicare Other | Source: Ambulatory Visit | Attending: Family Medicine | Admitting: Family Medicine

## 2021-10-25 ENCOUNTER — Other Ambulatory Visit: Payer: Self-pay

## 2021-10-25 DIAGNOSIS — I672 Cerebral atherosclerosis: Secondary | ICD-10-CM | POA: Diagnosis not present

## 2021-10-25 DIAGNOSIS — R270 Ataxia, unspecified: Secondary | ICD-10-CM

## 2021-10-25 MED ORDER — IOHEXOL 300 MG/ML  SOLN
100.0000 mL | Freq: Once | INTRAMUSCULAR | Status: AC | PRN
  Administered 2021-10-25: 100 mL via INTRAVENOUS

## 2021-10-26 ENCOUNTER — Telehealth: Payer: Self-pay

## 2021-10-26 DIAGNOSIS — R42 Dizziness and giddiness: Secondary | ICD-10-CM

## 2021-10-26 NOTE — Telephone Encounter (Signed)
-----   Message from Malva Limes, MD sent at 10/26/2021  1:54 PM EDT ----- CT scan is normal. No sign of stroke or TIA. If she is still having any dizziness I would recommend referral to neurology.

## 2021-10-26 NOTE — Telephone Encounter (Signed)
Pt advised.   Pt would like a referral to ENT instead to have her ears checked.     Thanks,   -Vernona Rieger

## 2021-11-11 ENCOUNTER — Other Ambulatory Visit: Payer: Self-pay | Admitting: Family Medicine

## 2021-11-11 DIAGNOSIS — M48062 Spinal stenosis, lumbar region with neurogenic claudication: Secondary | ICD-10-CM

## 2021-11-11 DIAGNOSIS — G8929 Other chronic pain: Secondary | ICD-10-CM

## 2021-11-11 NOTE — Telephone Encounter (Signed)
Requested Prescriptions  Pending Prescriptions Disp Refills  . gabapentin (NEURONTIN) 100 MG capsule [Pharmacy Med Name: GABAPENTIN 100 MG CAP] 30 capsule 2    Sig: TAKE ONE CAPSULE AT BEDTIME     Neurology: Anticonvulsants - gabapentin Passed - 11/11/2021  9:00 AM      Passed - Valid encounter within last 12 months    Recent Outpatient Visits          1 month ago Ataxia   [facility] Malva Limes, MD   4 months ago Cough   [facility] Malva Limes, MD   6 months ago Dysuria   [facility] Malva Limes, MD   8 months ago Chronic bilateral low back pain with right-sided sciatica   [facility], Marzella Schlein, MD   11 months ago Encounter for annual physical exam   [facility], Marzella Schlein, MD      Future Appointments            In 3 weeks Bacigalupo, Marzella Schlein, MD [facility], [facility]

## 2021-11-23 ENCOUNTER — Other Ambulatory Visit: Payer: Self-pay | Admitting: Family Medicine

## 2021-11-23 DIAGNOSIS — F439 Reaction to severe stress, unspecified: Secondary | ICD-10-CM

## 2021-12-01 DIAGNOSIS — R42 Dizziness and giddiness: Secondary | ICD-10-CM | POA: Diagnosis not present

## 2021-12-01 DIAGNOSIS — H6123 Impacted cerumen, bilateral: Secondary | ICD-10-CM | POA: Diagnosis not present

## 2021-12-01 DIAGNOSIS — H903 Sensorineural hearing loss, bilateral: Secondary | ICD-10-CM | POA: Diagnosis not present

## 2021-12-05 ENCOUNTER — Ambulatory Visit (INDEPENDENT_AMBULATORY_CARE_PROVIDER_SITE_OTHER): Payer: Medicare Other | Admitting: Family Medicine

## 2021-12-05 ENCOUNTER — Encounter: Payer: Self-pay | Admitting: Family Medicine

## 2021-12-05 ENCOUNTER — Other Ambulatory Visit: Payer: Self-pay

## 2021-12-05 VITALS — BP 164/92 | HR 72 | Temp 98.3°F | Wt 131.0 lb

## 2021-12-05 DIAGNOSIS — M159 Polyosteoarthritis, unspecified: Secondary | ICD-10-CM

## 2021-12-05 DIAGNOSIS — Z1231 Encounter for screening mammogram for malignant neoplasm of breast: Secondary | ICD-10-CM | POA: Diagnosis not present

## 2021-12-05 DIAGNOSIS — Z0000 Encounter for general adult medical examination without abnormal findings: Secondary | ICD-10-CM

## 2021-12-05 DIAGNOSIS — R030 Elevated blood-pressure reading, without diagnosis of hypertension: Secondary | ICD-10-CM

## 2021-12-05 NOTE — Progress Notes (Signed)
Complete physical exam   Patient: Jacqueline Daniels   DOB: [birth year]   85 y.o. Female  MRN: [id number] Visit Date: 12/05/2021  Today's healthcare provider: Shirlee Latch, MD   Chief Complaint  Patient presents with   Medicare Wellness   Subjective    Jacqueline Daniels is a 85 y.o. female who presents today for a complete physical exam.  She reports consuming a general diet.  She walks and plays golf  She generally feels well. She reports sleeping fairly well. She does not have additional problems to discuss today.    Past Medical History:  Diagnosis Date   Arthritis    Bone spur    GERD (gastroesophageal reflux disease)    Muscle spasm    Past Surgical History:  Procedure Laterality Date   ABDOMINAL HYSTERECTOMY  1978   and oophorectomy   APPENDECTOMY  1945   BREAST BIOPSY Left early 80's   benign    BUNIONECTOMY     FOOT SURGERY Bilateral    OOPHORECTOMY     REPLACEMENT TOTAL KNEE Bilateral    TOTAL SHOULDER ARTHROPLASTY Left 06/06/2020   Procedure: TOTAL SHOULDER ARTHROPLASTY-[facility];  Surgeon: Lyndle Herrlich, MD;  Location: [facility] ORS;  Service: Orthopedics;  Laterality: Left;   Social History   Socioeconomic History   Marital status: Married    Spouse name: Renae Fickle   Number of children: 1   Years of education: 12   Highest education level: High school graduate  Occupational History    Employer: [employer]  Tobacco Use   Smoking status: Former    Packs/day: 1.00    Years: 5.00    Pack years: 5.00    Types: Cigarettes    Quit date: 12/23/1973    Years since quitting: 47.9   Smokeless tobacco: Never  Vaping Use   Vaping Use: Never used  Substance and Sexual Activity   Alcohol use: No   Drug use: No   Sexual activity: Not Currently  Other Topics Concern   Not on file  Social History Narrative   Not on file   Social Determinants of Health   Financial Resource Strain: Not on file  Food Insecurity: Not on file  Transportation Needs: Not on file   Physical Activity: Not on file  Stress: Not on file  Social Connections: Not on file  Intimate Partner Violence: Not on file   Family Status  Relation Name Status   Mother  Deceased at age 54   Father  Deceased at age 77   Son  Alive   Sister  Alive   Family History  Problem Relation Age of Onset   Breast cancer Mother    Diabetes Mother    Prostate cancer Father    Heart attack Father    No Known Allergies  Patient Care Team: Malva Limes, MD as PCP - General (Family Medicine) Merri Ray, MD as Referring Physician (Physical Medicine and Rehabilitation) Alvie Heidelberg, MD as Referring Physician (Orthopedic Surgery) Pa, [name]   Medications: Outpatient Medications Prior to Visit  Medication Sig   gabapentin (NEURONTIN) 100 MG capsule TAKE ONE CAPSULE AT BEDTIME   Multiple Vitamins-Minerals (MULTIVITAMIN WITH MINERALS) tablet Take 1 tablet by mouth daily.   [DISCONTINUED] HYDROcodone-acetaminophen (NORCO/VICODIN) 5-325 MG tablet Take 1 tablet by mouth every 8 (eight) hours as needed for moderate pain. (Patient not taking: Reported on 10/11/2021)   [DISCONTINUED] meloxicam (MOBIC) 15 MG tablet TAKE ONE TABLET BY MOUTH EVERY DAY (  Patient not taking: Reported on 10/11/2021)   [DISCONTINUED] PARoxetine (PAXIL) 20 MG tablet TAKE 1 TABLET BY MOUTH DAILY (Patient not taking: Reported on 12/05/2021)   No facility-administered medications prior to visit.    Review of Systems  Constitutional: Negative.   HENT:  Positive for dental problem, hearing loss and sinus pressure.   Eyes: Negative.   Respiratory: Negative.    Cardiovascular: Negative.   Gastrointestinal:  Positive for constipation.  Endocrine: Negative.   Genitourinary: Negative.   Musculoskeletal:  Positive for arthralgias and back pain.  Skin: Negative.   Allergic/Immunologic: Negative.   Neurological: Negative.   Hematological: Negative.   Psychiatric/Behavioral: Negative.        Objective    BP (!) 164/92 (BP Location: Right Arm, Patient Position: Sitting, Cuff Size: Normal)    Pulse 72    Temp 98.3 F (36.8 C) (Oral)    Wt 131 lb (59.4 kg)    SpO2 100%    BMI 23.21 kg/m    Physical Exam Vitals reviewed.  Constitutional:      General: She is not in acute distress.    Appearance: Normal appearance. She is not ill-appearing or toxic-appearing.  HENT:     Head: Normocephalic and atraumatic.     Right Ear: External ear normal.     Left Ear: External ear normal.     Nose: Nose normal.     Mouth/Throat:     Mouth: Mucous membranes are moist.     Pharynx: Oropharynx is clear. No oropharyngeal exudate or posterior oropharyngeal erythema.  Eyes:     General: No scleral icterus.    Extraocular Movements: Extraocular movements intact.     Conjunctiva/sclera: Conjunctivae normal.     Pupils: Pupils are equal, round, and reactive to light.  Cardiovascular:     Rate and Rhythm: Normal rate and regular rhythm.     Pulses: Normal pulses.     Heart sounds: Normal heart sounds. No murmur heard.   No friction rub. No gallop.  Pulmonary:     Effort: Pulmonary effort is normal. No respiratory distress.     Breath sounds: Normal breath sounds. No wheezing or rhonchi.  Chest:     Chest wall: No tenderness.  Abdominal:     General: Abdomen is flat. There is no distension.     Palpations: Abdomen is soft.     Tenderness: There is no abdominal tenderness.  Musculoskeletal:        General: Normal range of motion.     Cervical back: Normal range of motion and neck supple.     Right lower leg: No edema.     Left lower leg: No edema.  Skin:    General: Skin is warm and dry.     Capillary Refill: Capillary refill takes less than 2 seconds.     Findings: No lesion or rash.  Neurological:     General: No focal deficit present.     Mental Status: She is alert and oriented to person, place, and time. Mental status is at baseline.  Psychiatric:        Mood and Affect: Mood  normal.        Behavior: Behavior normal.     Last depression screening scores PHQ 2/9 Scores 02/28/2021 11/29/2020 08/23/2020  PHQ - 2 Score 0 0 0  PHQ- 9 Score 5 0 -   Last fall risk screening Fall Risk  02/28/2021  Falls in the past year? 1  Number falls in past yr:  0  Comment -  Injury with Fall? 1  Risk for fall due to : History of fall(s)  Follow up Falls evaluation completed   Last Audit-C alcohol use screening Alcohol Use Disorder Test (AUDIT) 02/28/2021  1. How often do you have a drink containing alcohol? 0  2. How many drinks containing alcohol do you have on a typical day when you are drinking? 0  3. How often do you have six or more drinks on one occasion? 0  AUDIT-C Score 0  Alcohol Brief Interventions/Follow-up AUDIT Score <7 follow-up not indicated   A score of 3 or more in women, and 4 or more in men indicates increased risk for alcohol abuse, EXCEPT if all of the points are from question 1   No results found for any visits on 12/05/21.  Assessment & Plan    Routine Health Maintenance and Physical Exam  Exercise Activities and Dietary recommendations  Goals      DIET - REDUCE SUGAR INTAKE     Recommend to cut back on eating desserts and sugary foods to a couple times a week.         Immunization History  Administered Date(s) Administered   Fluad Quad(high Dose 65+) 08/18/2019, 10/12/2021   Influenza Split 10/19/2006, 10/25/2007, 09/18/2008, 09/08/2009, 09/30/2010, 09/12/2011   Influenza, High Dose Seasonal PF 10/05/2014, 09/22/2015, 10/02/2016   Influenza-Unspecified 10/01/2017, 09/22/2018, 09/23/2020   PFIZER(Purple Top)SARS-COV-2 Vaccination 01/22/2020, 02/12/2020   Pneumococcal Conjugate-13 03/12/2014   Pneumococcal Polysaccharide-23 10/25/2004, 03/18/2017   Tdap 10/24/2004, 09/12/2011   Zoster, Live 09/18/2008    Health Maintenance  Topic Date Due   Zoster Vaccines- Shingrix (1 of 2) Never done   COVID-19 Vaccine (3 - Booster for Pfizer series)  04/08/2020   TETANUS/TDAP  09/11/2021   Pneumonia Vaccine 30+ Years old  Completed   INFLUENZA VACCINE  Completed   DEXA SCAN  Completed   HPV VACCINES  Aged Out    Discussed health benefits of physical activity, and encouraged her to engage in regular exercise appropriate for her age and condition.  Problem List Items Addressed This Visit       Cardiovascular and Mediastinum   White coat syndrome without diagnosis of hypertension    History of elevated blood pressure at visits initially Check home BPs Continue to monitor F/u in 3 months      Relevant Orders   Basic Metabolic Panel (BMET)   Lipid panel     Musculoskeletal and Integument   Osteoarthritis    Continue gabapentin  nightly Continue tylenol as needed      Other Visit Diagnoses     Encounter for annual wellness visit (AWV) in Medicare patient    -  Primary   Encounter for annual physical exam       Relevant Orders   Basic Metabolic Panel (BMET)   Lipid panel   Screening mammogram for breast cancer       Relevant Orders   MM 3D SCREEN BREAST BILATERAL        Return in about 3 months (around 03/05/2022) for BP f/u with PCP.     Gwyndolyn Kaufman, Medical Student 12/05/2021, 10:11 AM   Patient seen along with MS3 student Gwyndolyn Kaufman. I personally evaluated this patient along with the student, and verified all aspects of the history, physical exam, and medical decision making as documented by the student. I agree with the student's documentation and have made all necessary edits.  Namrata Dangler, Marzella Schlein, MD, MPH [facility]  [facility]

## 2021-12-05 NOTE — Progress Notes (Deleted)
Complete physical exam   Patient: Jacqueline Daniels   DOB: 16-Jun-1936   85 y.o. Female  MRN: [id number] Visit Date: 12/05/2021  Today's healthcare provider: Shirlee Latch, MD   No chief complaint on file.  Subjective    Jacqueline Daniels is a 85 y.o. female who presents today for a complete physical exam.  She reports consuming a {diet types:17450} diet. {Exercise:19826} She generally feels {well/fairly well/poorly:18703}. She reports sleeping {well/fairly well/poorly:18703}. She {does/does not:200015} have additional problems to discuss today.  HPI  ***  Past Medical History:  Diagnosis Date   Arthritis    Bone spur    GERD (gastroesophageal reflux disease)    Muscle spasm    Past Surgical History:  Procedure Laterality Date   ABDOMINAL HYSTERECTOMY  1978   and oophorectomy   APPENDECTOMY  1945   BREAST BIOPSY Left early 80's   benign    BUNIONECTOMY     FOOT SURGERY Bilateral    OOPHORECTOMY     REPLACEMENT TOTAL KNEE Bilateral    TOTAL SHOULDER ARTHROPLASTY Left 06/06/2020   Procedure: TOTAL SHOULDER ARTHROPLASTY-[facility];  Surgeon: Lyndle Herrlich, MD;  Location: [facility] ORS;  Service: Orthopedics;  Laterality: Left;   Social History   Socioeconomic History   Marital status: Married    Spouse name: Renae Fickle   Number of children: 1   Years of education: 12   Highest education level: High school graduate  Occupational History    Employer: [employer]  Tobacco Use   Smoking status: Former    Packs/day: 1.00    Years: 5.00    Pack years: 5.00    Types: Cigarettes    Quit date: 12/23/1973    Years since quitting: 47.9   Smokeless tobacco: Never  Vaping Use   Vaping Use: Never used  Substance and Sexual Activity   Alcohol use: No   Drug use: No   Sexual activity: Not Currently  Other Topics Concern   Not on file  Social History Narrative   Not on file   Social Determinants of Health   Financial Resource Strain: Not on file  Food Insecurity: Not on file   Transportation Needs: Not on file  Physical Activity: Not on file  Stress: Not on file  Social Connections: Not on file  Intimate Partner Violence: Not on file   Family Status  Relation Name Status   Mother  Deceased at age 68   Father  Deceased at age 37   Son  Alive   Sister  Alive   Family History  Problem Relation Age of Onset   Breast cancer Mother    Diabetes Mother    Prostate cancer Father    Heart attack Father    No Known Allergies  Patient Care Team: Malva Limes, MD as PCP - General (Family Medicine) Merri Ray, MD as Referring Physician (Physical Medicine and Rehabilitation) Alvie Heidelberg, MD as Referring Physician (Orthopedic Surgery) Pa, [name]   Medications: Outpatient Medications Prior to Visit  Medication Sig   gabapentin (NEURONTIN) 100 MG capsule TAKE ONE CAPSULE AT BEDTIME   HYDROcodone-acetaminophen (NORCO/VICODIN) 5-325 MG tablet Take 1 tablet by mouth every 8 (eight) hours as needed for moderate pain. (Patient not taking: Reported on 10/11/2021)   meloxicam (MOBIC) 15 MG tablet TAKE ONE TABLET BY MOUTH EVERY DAY (Patient not taking: Reported on 10/11/2021)   Multiple Vitamins-Minerals (MULTIVITAMIN WITH MINERALS) tablet Take 1 tablet by mouth daily.   PARoxetine (PAXIL)  20 MG tablet TAKE 1 TABLET BY MOUTH DAILY   No facility-administered medications prior to visit.    Review of Systems  {Labs  Heme  Chem  Endocrine  Serology  Results Review (optional):23779}  Objective    There were no vitals taken for this visit. {Show previous vital signs (optional):23777}  Physical Exam  ***  Last depression screening scores PHQ 2/9 Scores 02/28/2021 11/29/2020 08/23/2020  PHQ - 2 Score 0 0 0  PHQ- 9 Score 5 0 -   Last fall risk screening Fall Risk  02/28/2021  Falls in the past year? 1  Number falls in past yr: 0  Comment -  Injury with Fall? 1  Risk for fall due to : History of fall(s)  Follow up Falls  evaluation completed   Last Audit-C alcohol use screening Alcohol Use Disorder Test (AUDIT) 02/28/2021  1. How often do you have a drink containing alcohol? 0  2. How many drinks containing alcohol do you have on a typical day when you are drinking? 0  3. How often do you have six or more drinks on one occasion? 0  AUDIT-C Score 0  Alcohol Brief Interventions/Follow-up AUDIT Score <7 follow-up not indicated   A score of 3 or more in women, and 4 or more in men indicates increased risk for alcohol abuse, EXCEPT if all of the points are from question 1   No results found for any visits on 12/05/21.  Assessment & Plan    Routine Health Maintenance and Physical Exam  Exercise Activities and Dietary recommendations  Goals      DIET - REDUCE SUGAR INTAKE     Recommend to cut back on eating desserts and sugary foods to a couple times a week.         Immunization History  Administered Date(s) Administered   Fluad Quad(high Dose 65+) 08/18/2019, 10/12/2021   Influenza Split 10/19/2006, 10/25/2007, 09/18/2008, 09/08/2009, 09/30/2010, 09/12/2011   Influenza, High Dose Seasonal PF 10/05/2014, 09/22/2015, 10/02/2016   Influenza-Unspecified 10/01/2017, 09/22/2018, 09/23/2020   PFIZER(Purple Top)SARS-COV-2 Vaccination 01/22/2020, 02/12/2020   Pneumococcal Conjugate-13 03/12/2014   Pneumococcal Polysaccharide-23 10/25/2004, 03/18/2017   Tdap 10/24/2004, 09/12/2011   Zoster, Live 09/18/2008    Health Maintenance  Topic Date Due   Zoster Vaccines- Shingrix (1 of 2) Never done   COVID-19 Vaccine (3 - Booster for Pfizer series) 04/08/2020   TETANUS/TDAP  09/11/2021   Pneumonia Vaccine 64+ Years old  Completed   INFLUENZA VACCINE  Completed   DEXA SCAN  Completed   HPV VACCINES  Aged Out    Discussed health benefits of physical activity, and encouraged her to engage in regular exercise appropriate for her age and condition.  ***  No follow-ups on file.     {provider  attestation***:1}   Shirlee Latch, MD  [facility] [phone] (phone) [phone] (fax)  [facility]

## 2021-12-05 NOTE — Patient Instructions (Signed)
Ask at the pharmacy about the tetanus vaccine (TDAP), Shingles vaccine (Shingrix), and bivalent COVID booster

## 2021-12-05 NOTE — Assessment & Plan Note (Signed)
Continue gabapentin 100mg  nightly Continue tylenol as needed

## 2021-12-05 NOTE — Assessment & Plan Note (Addendum)
History of elevated blood pressure at visits initially Check home BPs Continue to monitor F/u in 3 months

## 2021-12-06 LAB — BASIC METABOLIC PANEL
BUN/Creatinine Ratio: 24 (ref 12–28)
BUN: 21 mg/dL (ref 8–27)
CO2: 24 mmol/L (ref 20–29)
Calcium: 9.8 mg/dL (ref 8.7–10.3)
Chloride: 104 mmol/L (ref 96–106)
Creatinine, Ser: 0.89 mg/dL (ref 0.57–1.00)
Glucose: 86 mg/dL (ref 70–99)
Potassium: 4.2 mmol/L (ref 3.5–5.2)
Sodium: 143 mmol/L (ref 134–144)
eGFR: 65 mL/min/{1.73_m2} (ref >59)

## 2021-12-06 LAB — LIPID PANEL
Chol/HDL Ratio: 2.6 ratio (ref 0.0–4.4)
Cholesterol, Total: 246 mg/dL — ABNORMAL HIGH (ref 100–199)
HDL: 93 mg/dL (ref >39)
LDL Chol Calc (NIH): 141 mg/dL — ABNORMAL HIGH (ref 0–99)
Triglycerides: 70 mg/dL (ref 0–149)
VLDL Cholesterol Cal: 12 mg/dL (ref 5–40)

## 2021-12-11 ENCOUNTER — Encounter: Payer: Self-pay | Admitting: Family Medicine

## 2021-12-11 ENCOUNTER — Other Ambulatory Visit: Payer: Self-pay

## 2021-12-11 ENCOUNTER — Ambulatory Visit: Payer: Medicare Other | Admitting: Family Medicine

## 2021-12-11 VITALS — BP 179/77 | HR 67 | Temp 98.1°F | Resp 18 | Wt 129.0 lb

## 2021-12-11 DIAGNOSIS — R110 Nausea: Secondary | ICD-10-CM

## 2021-12-11 DIAGNOSIS — M48062 Spinal stenosis, lumbar region with neurogenic claudication: Secondary | ICD-10-CM

## 2021-12-11 DIAGNOSIS — M5441 Lumbago with sciatica, right side: Secondary | ICD-10-CM | POA: Diagnosis not present

## 2021-12-11 DIAGNOSIS — G8929 Other chronic pain: Secondary | ICD-10-CM

## 2021-12-11 DIAGNOSIS — R0982 Postnasal drip: Secondary | ICD-10-CM

## 2021-12-11 DIAGNOSIS — J069 Acute upper respiratory infection, unspecified: Secondary | ICD-10-CM | POA: Diagnosis not present

## 2021-12-11 MED ORDER — GABAPENTIN 100 MG PO CAPS
100.0000 mg | ORAL_CAPSULE | Freq: Every day | ORAL | 3 refills | Status: DC
Start: 2021-12-11 — End: 2023-07-01

## 2021-12-11 MED ORDER — FLUTICASONE PROPIONATE 50 MCG/ACT NA SUSP: 2.0000 | Freq: Every day | NASAL | 6 refills | Status: DC

## 2021-12-11 MED ORDER — PROMETHAZINE HCL 12.5 MG PO TABS: 12.5000 mg | ORAL_TABLET | Freq: Three times a day (TID) | ORAL | 0 refills | Status: DC | PRN

## 2021-12-11 NOTE — Patient Instructions (Addendum)
Please review the attached list of medications and notify my office if there are any errors.   Please take a Covid test in the next 1-2 day, and let know if it's positive.   You can take OTC Mucinex to help break up the mucous in your chest.

## 2021-12-11 NOTE — Progress Notes (Signed)
I,April Miller,acting as a scribe for Mila Merry, MD.,have documented all relevant documentation on the behalf of Mila Merry, MD,as directed by  Mila Merry, MD while in the presence of Mila Merry, MD.  Established patient visit   Patient: Jacqueline Daniels   DOB: 06-Feb-1936   85 y.o. Female  MRN: [id number] Visit Date: 12/11/2021  Today's healthcare provider: Mila Merry, MD   Chief Complaint  Patient presents with   Nausea   Nasal Congestion   Subjective    HPI   Patient has had nasal and chest congestion for 2 days. Patient states she is very nauseous from sinus drainage. Patient has been treating symptoms with ibuprofen with no relief. Also has taken Hydrocodone cough syrup she had left from the past.    Medications: Outpatient Medications Prior to Visit  Medication Sig   gabapentin (NEURONTIN) 100 MG capsule TAKE ONE CAPSULE AT BEDTIME   Multiple Vitamins-Minerals (MULTIVITAMIN WITH MINERALS) tablet Take 1 tablet by mouth daily.   No facility-administered medications prior to visit.    Review of Systems  Constitutional:  Negative for appetite change, chills, fatigue and fever.  HENT:  Positive for congestion.   Respiratory:  Negative for chest tightness and shortness of breath.   Cardiovascular:  Negative for chest pain and palpitations.  Gastrointestinal:  Negative for abdominal pain, nausea and vomiting.  Neurological:  Negative for dizziness and weakness.      Objective    BP (!) 179/77 (BP Location: Left Arm, Patient Position: Sitting, Cuff Size: Normal)    Pulse 67    Temp 98.1 F (36.7 C) (Temporal)    Resp 18    Wt 129 lb (58.5 kg)    SpO2 98%    BMI 22.85 kg/m    Physical Exam   General Appearance:    Well developed, well nourished female, alert, cooperative, in no acute distress  HENT:   throat normal without erythema or exudate, sinuses nontender, nasal mucosa congested, and nasal mucosa pale and congested  Eyes:    PERRL,  conjunctiva/corneas clear, EOM's intact       Lungs:     Clear to auscultation bilaterally, respirations unlabored  Heart:    Normal heart rate. Normal rhythm. No murmurs, rubs, or gallops.    Neurologic:   Awake, alert, oriented x 3. No apparent focal neurological           defect.         Assessment & Plan     1. Upper respiratory tract infection, unspecified type Recommend she take home covid test and call with results.   2. Post-nasal discharge  - promethazine (PHENERGAN) 12.5 MG tablet; Take 1 tablet (12.5 mg total) by mouth every 8 (eight) hours as needed for nausea or vomiting.  Dispense: 20 tablet; Refill: 0 - fluticasone (FLONASE) 50 MCG/ACT nasal spray; Place 2 sprays into both nostrils daily.  Dispense: 16 g; Refill: 6  3. Nausea  - promethazine (PHENERGAN) 12.5 MG tablet; Take 1 tablet (12.5 mg total) by mouth every 8 (eight) hours as needed for nausea or vomiting.  Dispense: 20 tablet; Refill: 0  4. Chronic bilateral low back pain with right-sided sciatica   5. Spinal stenosis of lumbar region with neurogenic claudication refill- gabapentin (NEURONTIN) 100 MG capsule; Take 1 capsule (100 mg total) by mouth at bedtime.  Dispense: 90 capsule; Refill: 3         The entirety of the information documented in the History of  Present Illness, Review of Systems and Physical Exam were personally obtained by me. Portions of this information were initially documented by the CMA and reviewed by me for thoroughness and accuracy.     Mila Merry, MD  [facility] [phone] (phone) [phone] (fax)  [facility]

## 2022-01-10 ENCOUNTER — Telehealth: Payer: Self-pay

## 2022-01-10 NOTE — Telephone Encounter (Signed)
Copied from CRM [id number]. Topic: Quick Communication - Rx Refill/Question >> Jan 10, 2022 10:43 AM Pawlus, Jacqueline Daniels wrote: Pt declined to make an appointment, was advised she will need to make an appt to get Daniels refill on discontinued medication Hydrocodone for pain, pt requested I still sent over Daniels message to see if Dr Sherrie Mustache would refill this without seeing her, please advise. >> Jan 10, 2022 10:47 AM Pawlus, Jacqueline Daniels wrote: HYDROcodone-acetaminophen (NORCO/VICODIN) 5-325 MG tablet [[id number]]  DISCONTINUED

## 2022-01-15 ENCOUNTER — Other Ambulatory Visit: Payer: Self-pay

## 2022-01-15 ENCOUNTER — Encounter: Payer: Self-pay | Admitting: Family Medicine

## 2022-01-15 ENCOUNTER — Ambulatory Visit: Payer: Medicare Other | Admitting: Family Medicine

## 2022-01-15 VITALS — BP 133/79 | HR 84 | Temp 98.3°F | Resp 14 | Wt 132.0 lb

## 2022-01-15 DIAGNOSIS — G8929 Other chronic pain: Secondary | ICD-10-CM | POA: Diagnosis not present

## 2022-01-15 DIAGNOSIS — M5442 Lumbago with sciatica, left side: Secondary | ICD-10-CM

## 2022-01-15 DIAGNOSIS — M5136 Other intervertebral disc degeneration, lumbar region: Secondary | ICD-10-CM | POA: Diagnosis not present

## 2022-01-15 DIAGNOSIS — M5441 Lumbago with sciatica, right side: Secondary | ICD-10-CM | POA: Diagnosis not present

## 2022-01-15 MED ORDER — HYDROCODONE-ACETAMINOPHEN 5-325 MG PO TABS: 1.0000 | ORAL_TABLET | Freq: Three times a day (TID) | ORAL | 0 refills | Status: DC | PRN

## 2022-01-15 NOTE — Progress Notes (Signed)
°  ° ° °  Established patient visit   Patient: Jacqueline Daniels   DOB: [birth year]   86 y.o. Female  MRN: [id number] Visit Date: 01/15/2022  Today's healthcare provider: Mila Merry, MD   Chief Complaint  Patient presents with   Back Pain   Subjective    Back Pain This is a chronic problem. Episode onset: 1-2 months ago. The pain is present in the lumbar spine. Radiates to: left buttock. Pertinent negatives include no abdominal pain, chest pain, fever or weakness.   She has long history DDD with prior MRIs and Xrays. Patient has tried Hydrocodone in the past which has helped relieve her back pain. Patient is requesting a prescription for Hydrocodone. She takes OTC Tylenol, but pain is occasionally incapacitating but hydrocodone/apap works well during those episodes.   Medications: Outpatient Medications Prior to Visit  Medication Sig   fluticasone (FLONASE) 50 MCG/ACT nasal spray Place 2 sprays into both nostrils daily.   gabapentin (NEURONTIN) 100 MG capsule Take 1 capsule (100 mg total) by mouth at bedtime.   Multiple Vitamins-Minerals (MULTIVITAMIN WITH MINERALS) tablet Take 1 tablet by mouth daily.   [DISCONTINUED] promethazine (PHENERGAN) 12.5 MG tablet Take 1 tablet (12.5 mg total) by mouth every 8 (eight) hours as needed for nausea or vomiting. (Patient not taking: Reported on 01/15/2022)   No facility-administered medications prior to visit.    Review of Systems  Constitutional:  Negative for appetite change, chills, fatigue and fever.  Respiratory:  Negative for chest tightness and shortness of breath.   Cardiovascular:  Negative for chest pain and palpitations.  Gastrointestinal:  Negative for abdominal pain, nausea and vomiting.  Musculoskeletal:  Positive for back pain.  Neurological:  Negative for dizziness and weakness.      Objective    BP 133/79 (BP Location: Left Arm, Patient Position: Sitting, Cuff Size: Normal)    Pulse 84    Temp 98.3 F (36.8 C) (Oral)     Resp 14    Wt 132 lb (59.9 kg)    SpO2 98% Comment: room air   BMI 23.38 kg/m  {Show previous vital signs (optional):23777}  Physical Exam  General appearance: Well developed, well nourished female, cooperative and in no acute distress Head: Normocephalic, without obvious abnormality, atraumatic Respiratory: Respirations even and unlabored, normal respiratory rate Extremities: All extremities are intact.  Skin: Skin color, texture, turgor normal. No rashes seen  Psych: Appropriate mood and affect. Neurologic: Mental status: Alert, oriented to person, place, and time, thought content appropriate.  Reviewed prior imaging studies.     Assessment & Plan      1. DDD (degenerative disc disease), lumbar   2. Chronic bilateral low back pain with bilateral sciatica  - HYDROcodone-acetaminophen (NORCO/VICODIN) 5-325 MG tablet; Take 1 tablet by mouth every 8 (eight) hours as needed for moderate pain.  Dispense: 30 tablet; Refill: 0   Discussed PT but she states she is already familiar with their treatments which are temporary, but she is interested in doing some home back exercises. PI printed for her.       The entirety of the information documented in the History of Present Illness, Review of Systems and Physical Exam were personally obtained by me. Portions of this information were initially documented by the CMA and reviewed by me for thoroughness and accuracy.     Mila Merry, MD  [facility] [phone] (phone) [phone] (fax)  [facility]

## 2022-01-15 NOTE — Patient Instructions (Signed)
.   Please review the attached list of medications and notify my office if there are any errors.   . Please bring all of your medications to every appointment so we can make sure that our medication list is the same as yours.   

## 2022-02-08 ENCOUNTER — Ambulatory Visit
Admission: RE | Admit: 2022-02-08 | Discharge: 2022-02-08 | Disposition: A | Discharge status: Home / Self Care | Payer: Medicare Other | Source: Ambulatory Visit | Attending: Family Medicine | Admitting: Family Medicine

## 2022-02-08 ENCOUNTER — Other Ambulatory Visit: Payer: Self-pay

## 2022-02-08 ENCOUNTER — Telehealth: Payer: Self-pay

## 2022-02-08 DIAGNOSIS — Z1231 Encounter for screening mammogram for malignant neoplasm of breast: Secondary | ICD-10-CM | POA: Diagnosis not present

## 2022-02-08 NOTE — Telephone Encounter (Signed)
Pt given lab results per notes of Dr. Beryle Flock on 02/08/22. Pt verbalized understanding.

## 2022-03-13 ENCOUNTER — Ambulatory Visit: Payer: Medicare Other | Admitting: Family Medicine

## 2022-03-21 ENCOUNTER — Other Ambulatory Visit: Payer: Self-pay

## 2022-03-21 ENCOUNTER — Ambulatory Visit: Payer: Medicare Other | Admitting: Dermatology

## 2022-03-21 DIAGNOSIS — L821 Other seborrheic keratosis: Secondary | ICD-10-CM | POA: Diagnosis not present

## 2022-03-21 DIAGNOSIS — L578 Other skin changes due to chronic exposure to nonionizing radiation: Secondary | ICD-10-CM | POA: Diagnosis not present

## 2022-03-21 DIAGNOSIS — L720 Epidermal cyst: Secondary | ICD-10-CM | POA: Diagnosis not present

## 2022-03-21 DIAGNOSIS — D692 Other nonthrombocytopenic purpura: Secondary | ICD-10-CM

## 2022-03-21 DIAGNOSIS — L853 Xerosis cutis: Secondary | ICD-10-CM

## 2022-03-21 NOTE — Progress Notes (Signed)
? ?  New Patient Visit ? ?Subjective  ?Jacqueline Daniels is a 86 y.o. female who presents for the following: New Patient (Initial Visit) (Patient here today concerning some dry skin at arms and legs, spot at right upper eyelid, red spots at arms and legs, and bruising. She denies personal or family history of skin cancer. ). ? ?The patient has spots, moles and lesions to be evaluated, some may be new or changing and the patient has concerns that these could be cancer. ? ? ? ?The following portions of the chart were reviewed this encounter and updated as appropriate:  ?  ?  ? ?Review of Systems:  No other skin or systemic complaints except as noted in HPI or Assessment and Plan. ? ?Objective  ?Well appearing patient in no apparent distress; mood and affect are within normal limits. ? ?A focused examination was performed including face, arms, and legs. Relevant physical exam findings are noted in the Assessment and Plan. ? ?Right Upper Eyelid ?Firm  white papule ? ? ? ?Assessment & Plan  ?Milia ?Right Upper Eyelid ? ?Benign, observe.  ? ?Discussed if bothersome can be removed/extracted.  ? ?Patient defers treatment at this time. ? ? ?Seborrheic Keratoses ?- Stuck-on, waxy, tan-brown macules/papules at arms and lower legs ?- Benign-appearing ?- Discussed benign etiology and prognosis. ?- Observe ?- Call for any changes ? ?Purpura - Chronic; persistent and recurrent.  Treatable, but not curable. ?At arms and legs ?- Violaceous macules and patches ?- Benign ?- Related to trauma, age, sun damage and/or use of blood thinners, chronic use of topical and/or oral steroids ?- Observe ?- Can use OTC arnica containing moisturizer such as Dermend Bruise Formula if desired ?- Call for worsening or other concerns ? ?Xerosis ?- diffuse xerotic patches ?- recommend gentle, hydrating skin care ?- gentle skin care handout given ? ?Actinic Damage ?- chronic, secondary to cumulative UV radiation exposure/sun exposure over time ?- diffuse  scaly erythematous macules with underlying dyspigmentation ?- Recommend daily broad spectrum sunscreen SPF 30+ to sun-exposed areas, reapply every 2 hours as needed.  ?- Recommend staying in the shade or wearing long sleeves, sun glasses (UVA+UVB protection) and wide brim hats (4-inch brim around the entire circumference of the hat). ?- Call for new or changing lesions. ? ? ?Return if symptoms worsen or fail to improve. ? ?I, Asher Muir, CMA, am acting as scribe for Willeen Niece, MD. ? ?Documentation: I have reviewed the above documentation for accuracy and completeness, and I agree with the above. ? ?Willeen Niece MD  ? ? ?

## 2022-03-21 NOTE — Progress Notes (Deleted)
? ?  Follow-Up Visit ?  ?Subjective  ?Jacqueline Daniels is a 86 y.o. female who presents for the following: New Patient (Initial Visit) (Patient here today for check few spots. ). ? ?*** ? ?The following portions of the chart were reviewed this encounter and updated as appropriate:   ?  ? ?{Review of Systems:34166::"No other skin or systemic complaints."} ? ?Objective  ?Well appearing patient in no apparent distress; mood and affect are within normal limits. ? ?{Exam:34163::"A full examination was performed including scalp, head, eyes, ears, nose, lips, neck, chest, axillae, abdomen, back, buttocks, bilateral upper extremities, bilateral lower extremities, hands, feet, fingers, toes, fingernails, and toenails. All findings within normal limits unless otherwise noted below."} ? ? ?Assessment & Plan  ? ?No follow-ups on file. ?I, Asher Muir, CMA, am acting as scribe for Willeen Niece, MD. ? ?

## 2022-03-21 NOTE — Patient Instructions (Addendum)
For bruising ?Can use OTC arnica containing moisturizer such as Dermend Bruise Formula if desired ? ? ?Recommend starting moisturizer with exfoliant (Urea, Salicylic acid, or Lactic acid) one to two times daily to help smooth rough and bumpy skin.  OTC options include Cetaphil Rough and Bumpy lotion (Urea), Eucerin Roughness Relief lotion or spot treatment cream (Urea), CeraVe SA lotion/cream for Rough and Bumpy skin (Sal Acid), Gold Bond Rough and Bumpy cream (Sal Acid), and AmLactin 12% lotion/cream (Lactic Acid).  If applying in morning, also apply sunscreen to sun-exposed areas, since these exfoliating moisturizers can increase sensitivity to sun. ? ? ?Seborrheic Keratosis ? ?What causes seborrheic keratoses? ?Seborrheic keratoses are harmless, common skin growths that first appear during adult life.  As time goes by, more growths appear.  Some people may develop a large number of them.  Seborrheic keratoses appear on both covered and uncovered body parts.  They are not caused by sunlight.  The tendency to develop seborrheic keratoses can be inherited.  They vary in color from skin-colored to gray, brown, or even black.  They can be either smooth or have a rough, warty surface.   ?Seborrheic keratoses are superficial and look as if they were stuck on the skin.  Under the microscope this type of keratosis looks like layers upon layers of skin.  That is why at times the top layer may seem to fall off, but the rest of the growth remains and re-grows.   ? ?Treatment ?Seborrheic keratoses do not need to be treated, but can easily be removed in the office.  Seborrheic keratoses often cause symptoms when they rub on clothing or jewelry.  Lesions can be in the way of shaving.  If they become inflamed, they can cause itching, soreness, or burning.  Removal of a seborrheic keratosis can be accomplished by freezing, burning, or surgery. ?If any spot bleeds, scabs, or grows rapidly, please return to have it checked, as  these can be an indication of a skin cancer.  ? ? ?Gentle Skin Care Guide ? ?1. Bathe no more than once a day. ? ?2. Avoid bathing in hot water ? ?3. Use a mild soap like Dove, Vanicream, Cetaphil, CeraVe. Can use Lever 2000 or Cetaphil antibacterial soap ? ?4. Use soap only where you need it. On most days, use it under your arms, between your legs, and on your feet. Let the water rinse other areas unless visibly dirty. ? ?5. When you get out of the bath/shower, use a towel to gently blot your skin dry, don't rub it. ? ?6. While your skin is still a little damp, apply a moisturizing cream such as Vanicream, CeraVe, Cetaphil, Eucerin, Sarna lotion or plain Vaseline Jelly. For hands apply Neutrogena [place] Hand Cream or Excipial Hand Cream. ? ?7. Reapply moisturizer any time you start to itch or feel dry. ? ?8. Sometimes using free and clear laundry detergents can be helpful. Fabric softener sheets should be avoided. Downy Free & Gentle liquid, or any liquid fabric softener that is free of dyes and perfumes, it acceptable to use ? ?9. If your doctor has given you prescription creams you may apply moisturizers over them  ? ? ? ? ? ?Melanoma ABCDEs ? ?Melanoma is the most dangerous type of skin cancer, and is the leading cause of death from skin disease.  You are more likely to develop melanoma if you: ?Have light-colored skin, light-colored eyes, or red or blond hair ?Spend a lot of time in the sun ?  Tan regularly, either outdoors or in a tanning bed ?Have had blistering sunburns, especially during childhood ?Have a close family member who has had a melanoma ?Have atypical moles or large birthmarks ? ?Early detection of melanoma is key since treatment is typically straightforward and cure rates are extremely high if we catch it early.  ? ?The first sign of melanoma is often a change in a mole or a new dark spot.  The ABCDE system is a way of remembering the signs of melanoma. ? ?A for asymmetry:  The two halves do  not match. ?B for border:  The edges of the growth are irregular. ?C for color:  A mixture of colors are present instead of an even brown color. ?D for diameter:  Melanomas are usually (but not always) greater than 6mm - the size of a pencil eraser. ?E for evolution:  The spot keeps changing in size, shape, and color. ? ?Please check your skin once per month between visits. You can use a small mirror in front and a large mirror behind you to keep an eye on the back side or your body.  ? ?If you see any new or changing lesions before your next follow-up, please call to schedule a visit. ? ?Please continue daily skin protection including broad spectrum sunscreen SPF 30+ to sun-exposed areas, reapplying every 2 hours as needed when you're outdoors.  ? ?Staying in the shade or wearing long sleeves, sun glasses (UVA+UVB protection) and wide brim hats (4-inch brim around the entire circumference of the hat) are also recommended for sun protection.  .. ? ?If You Need Anything After Your Visit ? ?If you have any questions or concerns for your doctor, please call our main line at [phone] and press option 4 to reach your doctor's medical assistant. If no one answers, please leave a voicemail as directed and we will return your call as soon as possible. Messages left after 4 pm will be answered the following business day.  ? ?You may also send us a message via MyChart. We typically respond to MyChart messages within 1-2 business days. ? ?For prescription refills, please ask your pharmacy to contact our office. Our fax number is [phone]. ? ?If you have an urgent issue when the clinic is closed that cannot wait until the next business day, you can page your doctor at the number below.   ? ?Please note that while we do our best to be available for urgent issues outside of office hours, we are not available 24/7.  ? ?If you have an urgent issue and are unable to reach us, you may choose to seek medical care at your  doctor's office, retail clinic, urgent care center, or emergency room. ? ?If you have a medical emergency, please immediately call 911 or go to the emergency department. ? ?Pager Numbers ? ?- Dr. Gwen PoundsKowalski: [phone] ? ?- Dr. Neale BurlyMoye: [phone] ? ?- Dr. Roseanne RenoStewart: [phone] ? ?In the event of inclement weather, please call our main line at [phone] for an update on the status of any delays or closures. ? ?Dermatology Medication Tips: ?Please keep the boxes that topical medications come in in order to help keep track of the instructions about where and how to use these. Pharmacies typically print the medication instructions only on the boxes and not directly on the medication tubes.  ? ?If your medication is too expensive, please contact our office at [phone] option 4 or send us a message through MyChart.  ? ?We  are unable to tell what your co-pay for medications will be in advance as this is different depending on your insurance coverage. However, we may be able to find a substitute medication at lower cost or fill out paperwork to get insurance to cover a needed medication.  ? ?If a prior authorization is required to get your medication covered by your insurance company, please allow [place] 1-2 business days to complete this process. ? ?Drug prices often vary depending on where the prescription is filled and some pharmacies may offer cheaper prices. ? ?The website www.goodrx.com contains coupons for medications through different pharmacies. The prices here do not account for what the cost may be with help from insurance (it may be cheaper with your insurance), but the website can give you the price if you did not use any insurance.  ?- You can print the associated coupon and take it with your prescription to the pharmacy.  ?- You may also stop by our office during regular business hours and pick up a GoodRx coupon card.  ?- If you need your prescription sent electronically to a different pharmacy, notify  our office through [facility] or by phone at [phone] option 4. ? ? ? ? ?Si Usted Necesita Algo Despu?s de Su Visita ? ?Tambi?n puede enviarnos un mensaje a trav?s de MyChart. Por lo general respondem

## 2022-04-24 ENCOUNTER — Ambulatory Visit: Payer: Medicare Other | Admitting: Family Medicine

## 2022-05-03 ENCOUNTER — Encounter: Payer: Self-pay | Admitting: Physician Assistant

## 2022-05-03 ENCOUNTER — Ambulatory Visit: Payer: Medicare Other | Admitting: Physician Assistant

## 2022-05-03 VITALS — BP 155/90 | HR 84 | Temp 98.0°F | Resp 16 | Wt 132.1 lb

## 2022-05-03 DIAGNOSIS — J029 Acute pharyngitis, unspecified: Secondary | ICD-10-CM | POA: Diagnosis not present

## 2022-05-03 DIAGNOSIS — J3089 Other allergic rhinitis: Secondary | ICD-10-CM

## 2022-05-03 DIAGNOSIS — R051 Acute cough: Secondary | ICD-10-CM

## 2022-05-03 LAB — POCT RAPID STREP A (OFFICE): Rapid Strep A Screen: NEGATIVE

## 2022-05-03 MED ORDER — BENZONATATE 100 MG PO CAPS: 100.0000 mg | ORAL_CAPSULE | Freq: Two times a day (BID) | ORAL | 0 refills | Status: DC | PRN

## 2022-05-03 NOTE — Progress Notes (Signed)
?  ? ?I,Joseline E Rosas,acting as a scribe for [employer], PA-C.,have documented all relevant documentation on the behalf of Jjesus Dingley E Kiam Bransfield, PA-C,as directed by  [employer], PA-C while in the presence of Aldo Sondgeroth E Urijah Raynor, PA-C.  ? ?Established patient visit ? ? ?Patient: Jacqueline Daniels   DOB: [birth year]   86 y.o. Female  MRN: [id number] ?Visit Date: 05/03/2022 ? ?Today's healthcare provider: Oswaldo Conroy Coyt Govoni, PA-C  ? ?Chief Complaint  ?Patient presents with  ? Cough  ? ?Subjective  ?  ?Cough ?This is a new problem. The current episode started in the past 7 days (for the past three days). The problem has been unchanged. The problem occurs constantly. The cough is Non-productive. Associated symptoms include nasal congestion, postnasal drip, rhinorrhea and a sore throat. Pertinent negatives include no chest pain, chills, ear congestion, ear pain, fever, headaches, myalgias, shortness of breath or wheezing. Nothing aggravates the symptoms. Treatments tried: Chloraseptic throat spray. The treatment provided no relief.   ? ? ? ?States this started this AM  ?Report sore throat, post nasal drainage, cough  ?She has been taking her Flonase the last few days but denies relief from this ?Reports some difficulty talking due to throat concerns ?Reports she has been stressed more than normal the past few months and this typically results in an illness.  ?States she is having some congestion and stuffiness in her nose that takes a lot to get out.  ?Reports back pain from working in her garden yesterday  ? ? ?Medications: ?Outpatient Medications Prior to Visit  ?Medication Sig  ? fluticasone (FLONASE) 50 MCG/ACT nasal spray Place 2 sprays into both nostrils daily.  ? gabapentin (NEURONTIN) 100 MG capsule Take 1 capsule (100 mg total) by mouth at bedtime.  ? HYDROcodone-acetaminophen (NORCO/VICODIN) 5-325 MG tablet Take 1 tablet by mouth every 8 (eight) hours as needed for moderate pain.  ? Multiple Vitamins-Minerals (MULTIVITAMIN WITH  MINERALS) tablet Take 1 tablet by mouth daily.  ? ?No facility-administered medications prior to visit.  ? ? ?Review of Systems  ?Constitutional:  Negative for chills, diaphoresis and fever.  ?HENT:  Positive for postnasal drip, rhinorrhea, sneezing and sore throat. Negative for ear pain.   ?Eyes:  Negative for discharge.  ?Respiratory:  Positive for cough. Negative for shortness of breath and wheezing.   ?Cardiovascular:  Negative for chest pain.  ?Gastrointestinal:  Negative for diarrhea, nausea and vomiting.  ?Musculoskeletal:  Negative for arthralgias and myalgias.  ?Neurological:  Negative for dizziness, light-headedness and headaches.  ? ? ?  Objective  ?  ?BP (!) 155/90 (BP Location: Right Arm, Patient Position: Sitting, Cuff Size: Normal)   Pulse 84   Temp 98 ?F (36.7 ?C) (Oral)   Resp 16   Wt 132 lb 1.6 oz (59.9 kg)   SpO2 99%   BMI 23.40 kg/m?  ? ? ?Physical Exam ?Constitutional:   ?   Appearance: Normal appearance.  ?HENT:  ?   Head: Normocephalic and atraumatic.  ?   Right Ear: Tympanic membrane, ear canal and external ear normal.  ?   Left Ear: Tympanic membrane, ear canal and external ear normal.  ?   Nose: Nose normal. No congestion.  ?   Mouth/Throat:  ?   Mouth: Mucous membranes are moist.  ?   Pharynx: Uvula midline. Posterior oropharyngeal erythema present. No pharyngeal swelling, oropharyngeal exudate or uvula swelling.  ?Eyes:  ?   Extraocular Movements: Extraocular movements intact.  ?   Conjunctiva/sclera:  Conjunctivae normal.  ?   Pupils: Pupils are equal, round, and reactive to light.  ?Cardiovascular:  ?   Rate and Rhythm: Normal rate and regular rhythm.  ?   Pulses: Normal pulses.  ?   Heart sounds: Normal heart sounds.  ?Pulmonary:  ?   Effort: Pulmonary effort is normal. No respiratory distress.  ?   Breath sounds: Normal breath sounds. No decreased breath sounds, wheezing, rhonchi or rales.  ?Musculoskeletal:  ?   Cervical back: Normal range of motion and neck supple.   ?Lymphadenopathy:  ?   Head:  ?   Right side of head: Submandibular adenopathy present. No submental adenopathy.  ?   Left side of head: No submental or submandibular adenopathy.  ?   Upper Body:  ?   Right upper body: No supraclavicular adenopathy.  ?   Left upper body: No supraclavicular adenopathy.  ?Neurological:  ?   General: No focal deficit present.  ?   Mental Status: She is alert and oriented to person, place, and time.  ?Psychiatric:     ?   Mood and Affect: Mood normal.     ?   Behavior: Behavior normal.     ?   Thought Content: Thought content normal.     ?   Judgment: Judgment normal.  ?  ? ? ?No results found for any visits on 05/03/22. ? Assessment & Plan  ?  ? ?Problem List Items Addressed This Visit   ?None ?Visit Diagnoses   ? ? Environmental and seasonal allergies    -  Primary ?Acute, recurrent problem per patient ?States symptoms started this AM with sore throat, post-nasal drainage and some stuffiness in ears ?Denies systemic symptoms of viral illness today leading me to believe this is more likely allergy mediated ?Discussed OTC 2nd gen antihistamines and generic equivalents for her to try ?Discussed using Tessalon pearls as needed to assist with cough while antihistamine regimen is taking effect ?Follow up as needed for persistent or progressing symptoms   ? Sore throat     ?Acute, new problem since this AM ?Centor criteria of approx 0/5 so will not pursue strep culture as Rapid strep was negative  ?Discussed likely etiology as allergies given lack of systemic viral illness symptoms and ways to manage with OTC antihistamines  ?Follow up as needed for persistent or progressing symptoms   ? Relevant Orders  ? POCT rapid strep A (Completed)  ? Acute cough     ?Acute, new problem ?Reports this started yesterday and seemed to intensify today  ?Suspect this is secondary to allergies given overall symptom presentation and active outdoor lifestyle. ?Recommend starting OTC second gen antihistamine  daily to assist with relief from symptoms.  ?Provided Tessalon pearls 100 mg PO BID PRN to assist with cough while antihistamine reaches therapeutic levels ?Can use OTC multi-symptom medication as well to assist with congestion and cough as needed ?Follow up as needed for persistent or progressing symptoms. ?  ? Relevant Medications  ? benzonatate (TESSALON) 100 MG capsule  ? ?  ? ? ? ?No follow-ups on file. ? ? ?I, Joncarlo Friberg E Lora Glomski, PA-C, have reviewed all documentation for this visit. The documentation on 05/03/22 for the exam, diagnosis, procedures, and orders are all accurate and complete. ? ? ?Diann Bangerter, MHS, PA-C ?[facility] ?[facility]  ? ? ? ?

## 2022-05-03 NOTE — Patient Instructions (Addendum)
Based on your symptoms I think you may be experiencing allergies ? ?I recommend taking a daily second generation antihistamine such as Claritin, Zyrtec or Allegra  ?The generics of these are usually more cost effective and are just as effective as the brand name ? ?A few days of one of these should help improve your symptoms ? ?I have sent in a script for Tessalon pearls 100 mg to be taken by mouth every 12 hours as needed for cough ? ?To further help with the stuffy nose, you can try taking Dayquil/ Theraflu/ Alkaseltzer (choose whichever one you like the most) as these will help with symptomatic relief while your antihistamine starts to kick in  ? ?If your symptoms do not improve or become worse in the next 5-7 days please make an apt at the office so we can see you  ?Go to the ER if you begin to have more serious symptoms such as shortness of breath, trouble breathing, loss of consciousness, swelling around the eyes, high fever, severe lasting headaches, vision changes or neck pain/stiffness.  ? ?

## 2022-05-07 ENCOUNTER — Ambulatory Visit: Payer: Medicare Other | Admitting: Family Medicine

## 2022-05-07 ENCOUNTER — Encounter: Payer: Self-pay | Admitting: Family Medicine

## 2022-05-07 VITALS — BP 153/83 | HR 78 | Temp 98.3°F | Resp 16 | Wt 129.0 lb

## 2022-05-07 DIAGNOSIS — J40 Bronchitis, not specified as acute or chronic: Secondary | ICD-10-CM | POA: Diagnosis not present

## 2022-05-07 MED ORDER — CEFDINIR 300 MG PO CAPS: 600.0000 mg | ORAL_CAPSULE | Freq: Every day | ORAL | 0 refills | Status: AC

## 2022-05-07 MED ORDER — PREDNISONE 10 MG PO TABS: ORAL_TABLET | ORAL | 0 refills | Status: AC

## 2022-05-07 MED ORDER — BACLOFEN 10 MG PO TABS
10.0000 mg | ORAL_TABLET | Freq: Three times a day (TID) | ORAL | 0 refills | Status: DC | PRN
Start: 2022-05-07 — End: 2022-08-20

## 2022-05-07 NOTE — Progress Notes (Signed)
I,Roshena L Chambers,acting as a scribe for Lelon Huh, MD.,have documented all relevant documentation on the behalf of Lelon Huh, MD,as directed by  Lelon Huh, MD while in the presence of Lelon Huh, MD.   Established patient visit   Patient: Jacqueline Daniels   DOB: 1936-02-20   86 y.o. Female  MRN: [id number] Visit Date: 05/07/2022  Today's healthcare provider: Lelon Huh, MD   Chief Complaint  Patient presents with   Cough   Subjective    Cough This is a new problem. Episode onset: 1 week ago. The problem has been gradually worsening. Cough characteristics: scant mucus production. Associated symptoms include chills and rhinorrhea. Pertinent negatives include no chest pain, fever or shortness of breath.   Patient was seen by Talitha Givens, PA-C for this problem on 05/03/2022. Strep test was negative. Patient was prescribed benzonatate (TESSALON) 100 MG capsule for cough. Patient reports good compliance with medications,  but feels that her cough has worsened and rarely productive of green mucous.   Medications: Outpatient Medications Prior to Visit  Medication Sig   benzonatate (TESSALON) 100 MG capsule Take 1 capsule (100 mg total) by mouth 2 (two) times daily as needed for cough.   fluticasone (FLONASE) 50 MCG/ACT nasal spray Place 2 sprays into both nostrils daily.   gabapentin (NEURONTIN) 100 MG capsule Take 1 capsule (100 mg total) by mouth at bedtime.   HYDROcodone-acetaminophen (NORCO/VICODIN) 5-325 MG tablet Take 1 tablet by mouth every 8 (eight) hours as needed for moderate pain.   Multiple Vitamins-Minerals (MULTIVITAMIN WITH MINERALS) tablet Take 1 tablet by mouth daily.   No facility-administered medications prior to visit.    Review of Systems  Constitutional:  Positive for chills. Negative for appetite change, fatigue and fever.  HENT:  Positive for rhinorrhea.   Respiratory:  Positive for cough. Negative for chest tightness and shortness of  breath.   Cardiovascular:  Negative for chest pain and palpitations.  Gastrointestinal:  Negative for abdominal pain, nausea and vomiting.  Musculoskeletal:        Muscle spasms  Neurological:  Negative for dizziness and weakness.      Objective    BP (!) 153/83 (BP Location: Left Arm, Patient Position: Sitting, Cuff Size: Normal)   Pulse 78   Temp 98.3 F (36.8 C) (Oral)   Resp 16   Wt 129 lb (58.5 kg)   SpO2 98% Comment: room air  BMI 22.85 kg/m    Physical Exam   General Appearance:    Well developed, well nourished female, alert, cooperative, in no acute distress  HENT:   ENT exam normal, no neck nodes or sinus tenderness  Eyes:    PERRL, conjunctiva/corneas clear, EOM's intact       Lungs:     Occasional expiratory wheeze, no rales, respirations unlabored  Heart:    Normal heart rate. Normal rhythm. No murmurs, rubs, or gallops.    Neurologic:   Awake, alert, oriented x 3. No apparent focal neurological           defect.          Assessment & Plan     1. Bronchitis  - cefdinir (OMNICEF) 300 MG capsule; Take 2 capsules (600 mg total) by mouth daily for 7 days.  Dispense: 14 capsule; Refill: 0 - predniSONE (DELTASONE) 10 MG tablet; 6 tablets for 1 day, then 5 for 1 day, then 4 for 1 day, then 3 for 1 day, then 2 for 1 day then  1 for 1 day.  Dispense: 21 tablet; Refill: 0       The entirety of the information documented in the History of Present Illness, Review of Systems and Physical Exam were personally obtained by me. Portions of this information were initially documented by the CMA and reviewed by me for thoroughness and accuracy.     Lelon Huh, MD  [facility] [phone] (phone) [phone] (fax)  [place]

## 2022-05-10 ENCOUNTER — Telehealth: Payer: Self-pay

## 2022-05-10 NOTE — Telephone Encounter (Signed)
Patient called and states that she wanted Dr. Sherrie Mustache know that her cough and bronchitis is improving. She states she is still taking the prednisone and will call back if she has any concerns.

## 2022-05-10 NOTE — Telephone Encounter (Signed)
FYI

## 2022-05-14 ENCOUNTER — Telehealth: Payer: Self-pay

## 2022-05-14 MED ORDER — CIPROFLOXACIN HCL 500 MG PO TABS: 500.0000 mg | ORAL_TABLET | Freq: Two times a day (BID) | ORAL | 0 refills | Status: AC

## 2022-05-14 NOTE — Telephone Encounter (Signed)
Pt called and states that she may need another antibiotic. She states that she is still coughing and having a lot of chest congestion. Pt was seen in office 05/07/22 for the same symptoms. Pt was given prednisone and cefdinir. Please advise pt.

## 2022-05-14 NOTE — Addendum Note (Signed)
Addended by: Malva Limes on: 05/14/2022 12:19 PM   Modules accepted: Orders

## 2022-08-01 ENCOUNTER — Other Ambulatory Visit: Payer: Self-pay | Admitting: Family Medicine

## 2022-08-01 DIAGNOSIS — G8929 Other chronic pain: Secondary | ICD-10-CM

## 2022-08-01 MED ORDER — HYDROCODONE-ACETAMINOPHEN 5-325 MG PO TABS: 1.0000 | ORAL_TABLET | Freq: Three times a day (TID) | ORAL | 0 refills | Status: DC | PRN

## 2022-08-01 NOTE — Telephone Encounter (Signed)
Requested medication (s) are due for refill today - provider review   Requested medication (s) are on the active medication list -yes  Future visit scheduled -no  Last refill: 01/15/22 #30  Notes to clinic: non delegated Rx  Requested Prescriptions  Pending Prescriptions Disp Refills   HYDROcodone-acetaminophen (NORCO/VICODIN) 5-325 MG tablet 30 tablet 0    Sig: Take 1 tablet by mouth every 8 (eight) hours as needed for moderate pain.     Not Delegated - Analgesics:  Opioid Agonist Combinations Failed - 08/01/2022 11:51 AM      Failed - This refill cannot be delegated      Failed - Urine Drug Screen completed in last 360 days      Passed - Valid encounter within last 3 months    Recent Outpatient Visits           2 months ago Bronchitis   [facility] Malva Limes, MD   3 months ago Environmental and seasonal allergies   [facility], Oswaldo Conroy, PA-C   6 months ago DDD (degenerative disc disease), lumbar   [facility] Malva Limes, MD   7 months ago Upper respiratory tract infection, unspecified type   [facility] Malva Limes, MD   7 months ago Encounter for annual wellness visit (AWV) in Medicare patient   [facility], Marzella Schlein, MD                 Requested Prescriptions  Pending Prescriptions Disp Refills   HYDROcodone-acetaminophen (NORCO/VICODIN) 5-325 MG tablet 30 tablet 0    Sig: Take 1 tablet by mouth every 8 (eight) hours as needed for moderate pain.     Not Delegated - Analgesics:  Opioid Agonist Combinations Failed - 08/01/2022 11:51 AM      Failed - This refill cannot be delegated      Failed - Urine Drug Screen completed in last 360 days      Passed - Valid encounter within last 3 months    Recent Outpatient Visits           2 months ago Bronchitis   [facility] Malva Limes, MD   3 months ago Environmental and seasonal allergies    [facility], Oswaldo Conroy, PA-C   6 months ago DDD (degenerative disc disease), lumbar   [facility] Malva Limes, MD   7 months ago Upper respiratory tract infection, unspecified type   [facility] Malva Limes, MD   7 months ago Encounter for annual wellness visit (AWV) in Medicare patient   [facility], Marzella Schlein, MD

## 2022-08-01 NOTE — Telephone Encounter (Signed)
Medication Refill - Medication: HYDROcodone-acetaminophen (NORCO/VICODIN) 5-325 MG tablet  Has the patient contacted their pharmacy? No. Pt previously told to contact provider  Preferred Pharmacy (with phone number or street name):  [facility] - [place], [place] - [address] Phone:  [phone]  Fax:  [phone]     Has the patient been seen for an appointment in the last year OR does the patient have an upcoming appointment? Yes.    Agent: Please be advised that RX refills may take up to 3 business days. We ask that you follow-up with your pharmacy.

## 2022-08-13 IMAGING — MG DIGITAL SCREENING BILAT W/ TOMO W/ CAD
8 series · 8 of 24 positions shown · non-contrast
Comparison: Previous exam(s).

CLINICAL DATA: Screening.

EXAM:
DIGITAL SCREENING BILATERAL MAMMOGRAM WITH TOMO AND CAD

[R CC synth-2D]
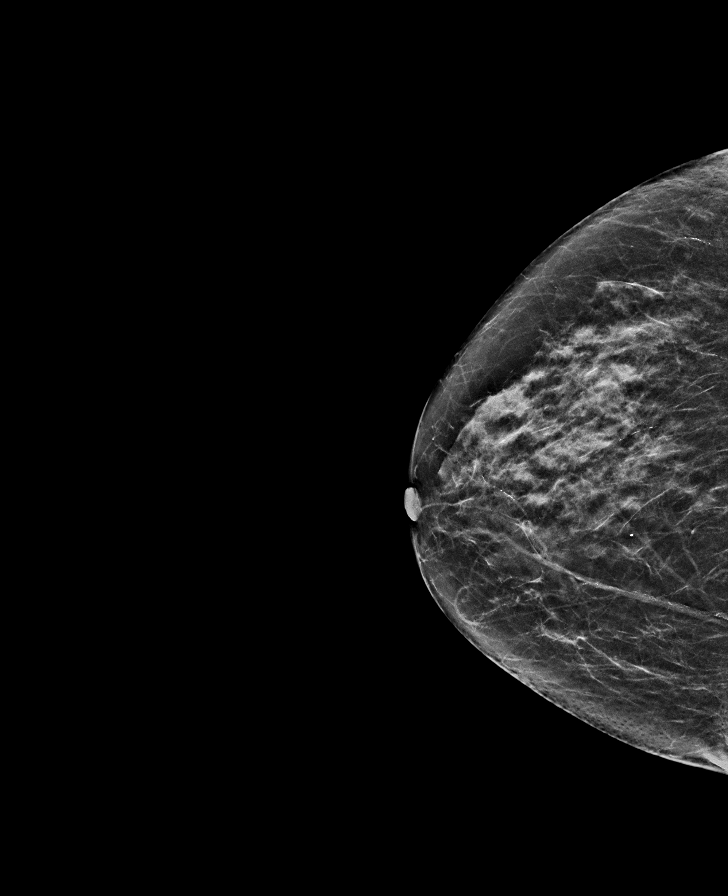

[L CC synth-2D]
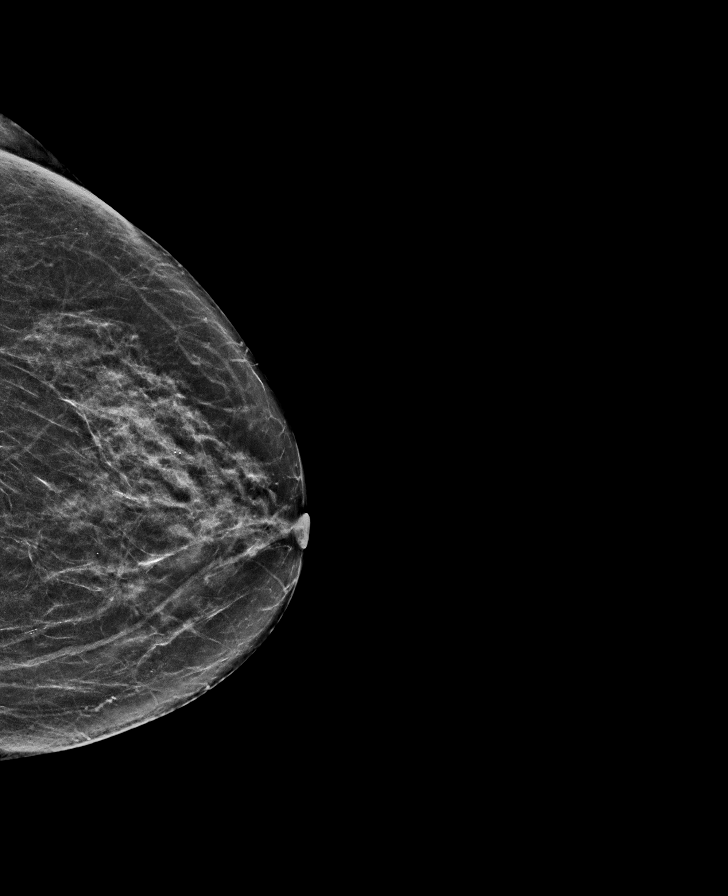

[R MLO synth-2D]
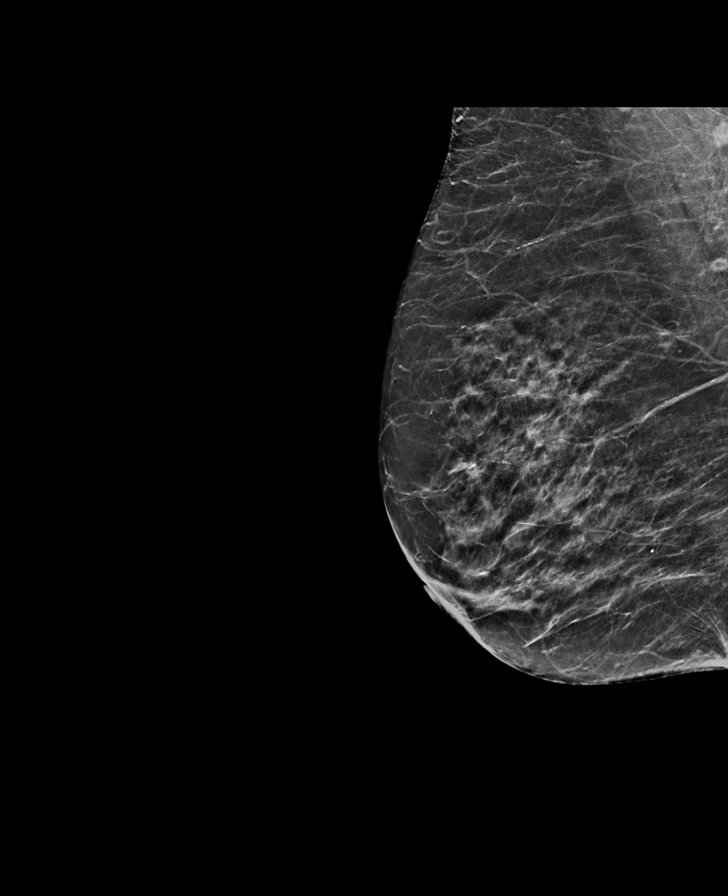

[L MLO synth-2D]
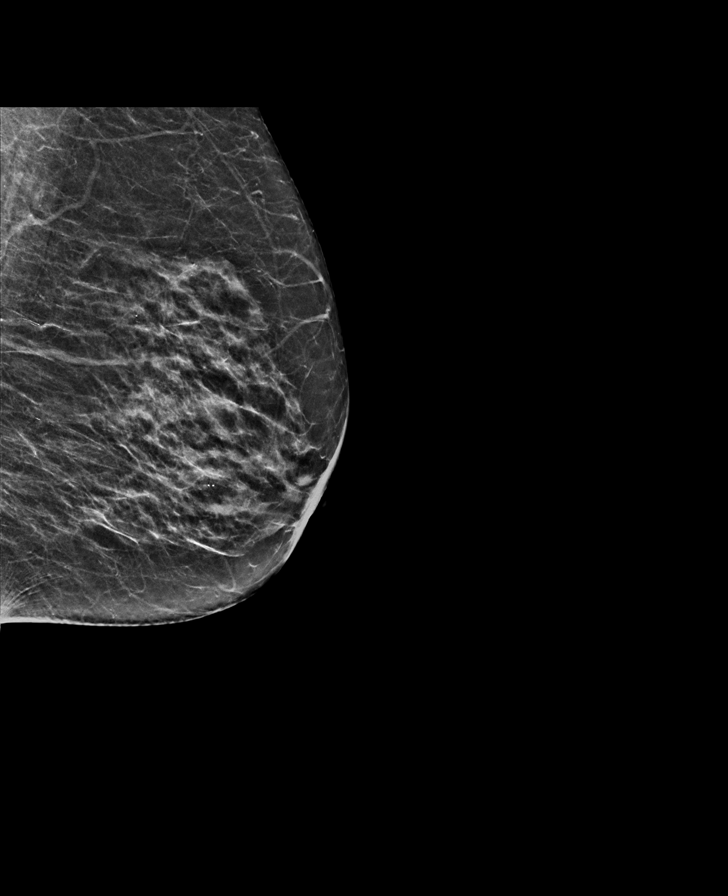

[R MLO tomo · tomo slice 29/56.0]
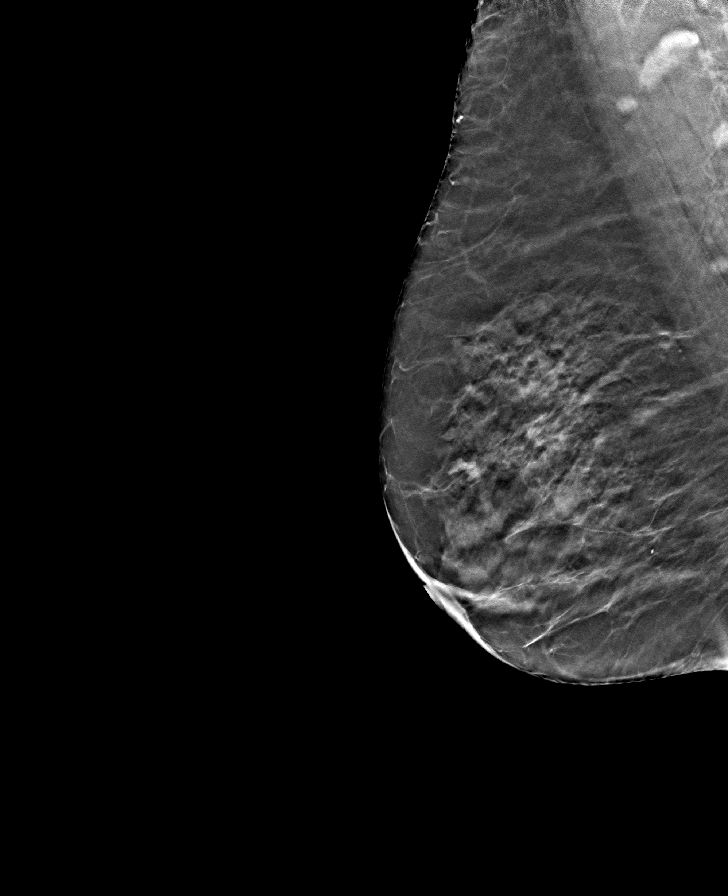

[R CC tomo · tomo slice 27/54.0]
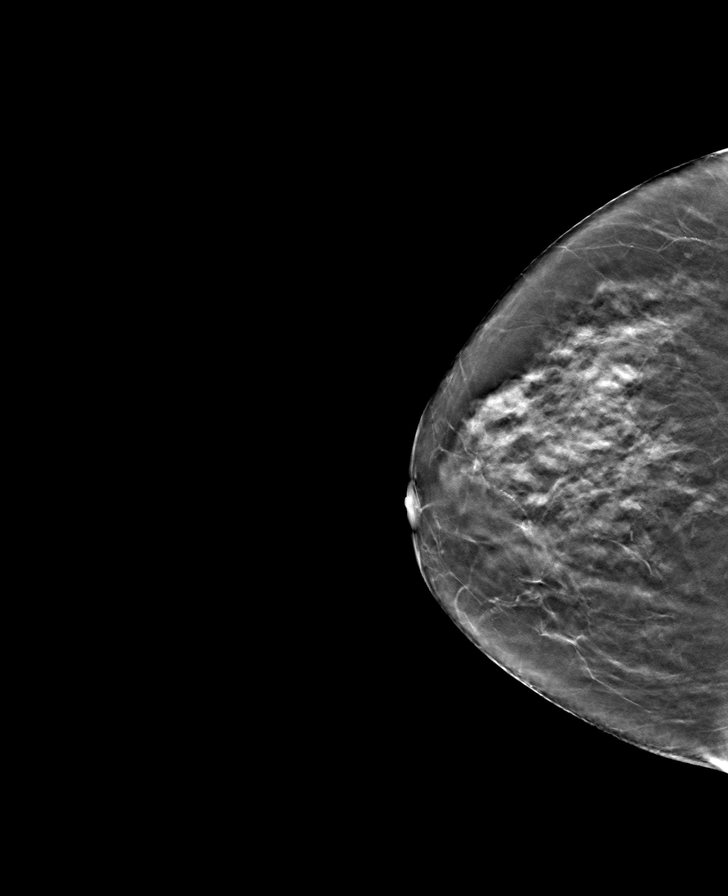

[L MLO tomo · tomo slice 28/55.0]
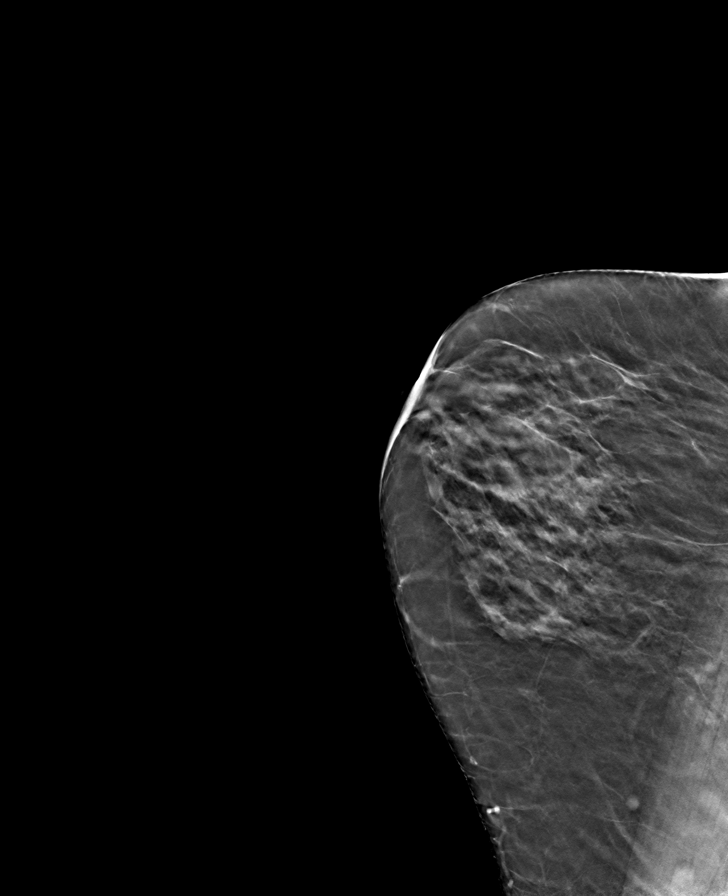

[L CC tomo · tomo slice 29/58.0]
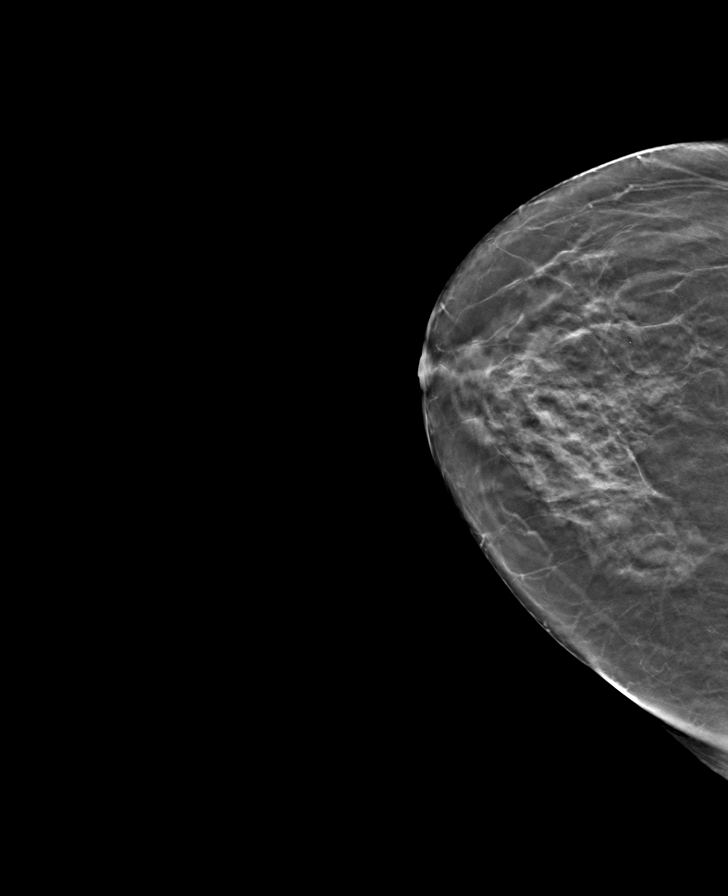

[8 of 24 positions shown; findings below may reference images not displayed]

ACR Breast Density Category c: The breast tissue is heterogeneously
dense, which may obscure small masses.
FINDINGS: There are no findings suspicious for malignancy. Images were
processed with CAD.
IMPRESSION: No mammographic evidence of malignancy. A result letter of this
screening mammogram will be mailed directly to the patient.

RECOMMENDATION:
Screening mammogram in one year. (Code:[id number])

BI-RADS CATEGORY  1: Negative.

## 2022-08-20 ENCOUNTER — Encounter: Payer: Self-pay | Admitting: Family Medicine

## 2022-08-20 ENCOUNTER — Ambulatory Visit (INDEPENDENT_AMBULATORY_CARE_PROVIDER_SITE_OTHER): Payer: Medicare Other | Admitting: Family Medicine

## 2022-08-20 VITALS — BP 166/82 | HR 76 | Temp 98.4°F | Resp 16 | Ht 63.0 in | Wt 132.0 lb

## 2022-08-20 DIAGNOSIS — R5383 Other fatigue: Secondary | ICD-10-CM

## 2022-08-20 DIAGNOSIS — R030 Elevated blood-pressure reading, without diagnosis of hypertension: Secondary | ICD-10-CM | POA: Diagnosis not present

## 2022-08-20 MED ORDER — BACLOFEN 10 MG PO TABS
10.0000 mg | ORAL_TABLET | Freq: Three times a day (TID) | ORAL | 0 refills | Status: DC | PRN
Start: 2022-08-20 — End: 2023-07-14

## 2022-08-20 NOTE — Progress Notes (Unsigned)
    SUBJECTIVE:   CHIEF COMPLAINT / HPI:   FATIGUE - easily worked up/anxious - husband passed 1 year ago Duration: ~6 months Severity: {Blank single:19197::"mild","moderate","severe","1/10","2/10","3/10","4/10","5/10","6/10","7/10","8/10","9/10","10/10"}  Onset: {Blank single:19197::"sudden","gradual"} Context when symptoms started:  {Blank single:19197::"none","unknown"} Symptoms improve with rest: yes  Depressive symptoms: {Blank single:19197::"yes","no"} Stress/anxiety: {Blank single:19197::"yes","no"} Insomnia: no  Snoring:  unsure Observed apnea by bed partner:  sleeps alone Daytime hypersomnolence: sometimes Wakes feeling refreshed: yes History of sleep study: no Dysnea on exertion:  {Blank single:19197::"yes","no"} Orthopnea/PND: no Chest pain: no Chronic cough: no Lower extremity edema: no Arthralgias:{Blank single:19197::"yes","no"} Myalgias: {Blank single:19197::"yes","no"} Weakness: {Blank single:19197::"yes","no"} Rash: {Blank single:19197::"yes","no"}   OBJECTIVE:   BP (!) 166/82 (BP Location: Left Arm, Patient Position: Sitting, Cuff Size: Normal)   Pulse 76   Temp 98.4 F (36.9 C) (Oral)   Resp 16   Ht 5\' 3"  (1.6 m)   Wt 132 lb (59.9 kg)   BMI 23.38 kg/m   ***  ASSESSMENT/PLAN:   No problem-specific Assessment & Plan notes found for this encounter.     , DO

## 2022-08-22 DIAGNOSIS — R5383 Other fatigue: Secondary | ICD-10-CM | POA: Insufficient documentation

## 2022-08-22 NOTE — Assessment & Plan Note (Signed)
Unclear etiology. Good sleep hygiene and stays active, unlikely deconditioning. No symptoms or exam findings to concern for new onset CHF, OSA. Symptoms did start after the passing of husband, question possible depressive symptoms, will obtain PHQ/GAD on follow up. Also obtaining labs to assess for other contributors, anemia, vitamin deficiencies, thyroid abnormalities, etc. F/u 4 weeks.

## 2022-08-23 LAB — CBC WITH DIFFERENTIAL/PLATELET
Basophils Absolute: 0 10*3/uL (ref 0.0–0.2)
Basos: 1 %
EOS (ABSOLUTE): 0.1 10*3/uL (ref 0.0–0.4)
Eos: 2 %
Hematocrit: 38.5 % (ref 34.0–46.6)
Hemoglobin: 12.7 g/dL (ref 11.1–15.9)
Immature Grans (Abs): 0 10*3/uL (ref 0.0–0.1)
Immature Granulocytes: 0 %
Lymphocytes Absolute: 1.8 10*3/uL (ref 0.7–3.1)
Lymphs: 27 %
MCH: 31.4 pg (ref 26.6–33.0)
MCHC: 33 g/dL (ref 31.5–35.7)
MCV: 95 fL (ref 79–97)
Monocytes Absolute: 0.6 10*3/uL (ref 0.1–0.9)
Monocytes: 9 %
Neutrophils Absolute: 4.2 10*3/uL (ref 1.4–7.0)
Neutrophils: 61 %
Platelets: 280 10*3/uL (ref 150–450)
RBC: 4.05 x10E6/uL (ref 3.77–5.28)
RDW: 12.1 % (ref 11.7–15.4)
WBC: 6.8 10*3/uL (ref 3.4–10.8)

## 2022-08-23 LAB — TSH: TSH: 1.45 u[IU]/mL (ref 0.450–4.500)

## 2022-08-23 LAB — BASIC METABOLIC PANEL
BUN/Creatinine Ratio: 22 (ref 12–28)
BUN: 17 mg/dL (ref 8–27)
CO2: 23 mmol/L (ref 20–29)
Calcium: 9.9 mg/dL (ref 8.7–10.3)
Chloride: 101 mmol/L (ref 96–106)
Creatinine, Ser: 0.78 mg/dL (ref 0.57–1.00)
Glucose: 91 mg/dL (ref 70–99)
Potassium: 4.1 mmol/L (ref 3.5–5.2)
Sodium: 139 mmol/L (ref 134–144)
eGFR: 75 mL/min/{1.73_m2} (ref >59)

## 2022-08-23 LAB — VITAMIN B12: Vitamin B-12: 796 pg/mL (ref 232–1245)

## 2022-12-21 ENCOUNTER — Other Ambulatory Visit: Payer: Self-pay | Admitting: Family Medicine

## 2022-12-21 DIAGNOSIS — G8929 Other chronic pain: Secondary | ICD-10-CM

## 2022-12-21 NOTE — Telephone Encounter (Signed)
Medication Refill - Medication: HYDROcodone-acetaminophen (NORCO/VICODIN) 5-325 MG tablet   Has the patient contacted their pharmacy? No. No, more refills.   (Agent: If no, request that the patient contact the pharmacy for the refill. If patient does not wish to contact the pharmacy document the reason why and proceed with request.)   Preferred Pharmacy (with phone number or street name):  [facility] - [place], [place] - [address]  [address] [place] [address]  Phone: [phone] Fax: [phone]  Hours: Not open 24 hours   Has the patient been seen for an appointment in the last year OR does the patient have an upcoming appointment? Yes.    Agent: Please be advised that RX refills may take up to 3 business days. We ask that you follow-up with your pharmacy.

## 2022-12-21 NOTE — Telephone Encounter (Signed)
Requested medication (s) are due for refill today: yes  Requested medication (s) are on the active medication list: yes  Last refill:  08/01/22  Future visit scheduled: yes  Notes to clinic:  Unable to refill per protocol, cannot delegate.      Requested Prescriptions  Pending Prescriptions Disp Refills   HYDROcodone-acetaminophen (NORCO/VICODIN) 5-325 MG tablet 30 tablet 0    Sig: Take 1 tablet by mouth every 8 (eight) hours as needed for moderate pain.     Not Delegated - Analgesics:  Opioid Agonist Combinations Failed - 12/21/2022 12:28 PM      Failed - This refill cannot be delegated      Failed - Urine Drug Screen completed in last 360 days      Failed - Valid encounter within last 3 months    Recent Outpatient Visits           4 months ago Fatigue, unspecified type   [facility] Caro Laroche, DO   7 months ago Bronchitis   [facility] Malva Limes, MD   7 months ago Environmental and seasonal allergies   [facility], Oswaldo Conroy, PA-C   11 months ago DDD (degenerative disc disease), lumbar   [facility] Malva Limes, MD   1 year ago Upper respiratory tract infection, unspecified type   [facility] Malva Limes, MD

## 2022-12-23 MED ORDER — HYDROCODONE-ACETAMINOPHEN 5-325 MG PO TABS: 1.0000 | ORAL_TABLET | Freq: Three times a day (TID) | ORAL | 0 refills | Status: DC | PRN

## 2022-12-31 ENCOUNTER — Ambulatory Visit (INDEPENDENT_AMBULATORY_CARE_PROVIDER_SITE_OTHER): Payer: Medicare Other

## 2022-12-31 VITALS — BP 158/80 | Ht 63.0 in | Wt 135.5 lb

## 2022-12-31 DIAGNOSIS — Z0000 Encounter for general adult medical examination without abnormal findings: Secondary | ICD-10-CM

## 2022-12-31 NOTE — Patient Instructions (Signed)
Jacqueline Daniels , Thank you for taking time to come for your Medicare Wellness Visit. I appreciate your ongoing commitment to your health goals. Please review the following plan we discussed and let me know if I can assist you in the future.   Screening recommendations/referrals: Colonoscopy: aged out Mammogram: aged out Bone Density: declined Recommended yearly ophthalmology/optometry visit for glaucoma screening and checkup Recommended yearly dental visit for hygiene and checkup  Vaccinations: Influenza vaccine: 10/12/21 Pneumococcal vaccine: 03/18/17 Tdap vaccine: 09/12/11, due if have injury Shingles vaccine: Zostavax 09/18/08   Covid-19:01/22/20, 02/12/20   Advanced directives: no  Conditions/risks identified: no  Next appointment: Follow up in one year for your annual wellness visit 01/02/24 8:45 am in person   Preventive Care 85 Years and Older, Female Preventive care refers to lifestyle choices and visits with your health care provider that can promote health and wellness. What does preventive care include? A yearly physical exam. This is also called an annual well check. Dental exams once or twice a year. Routine eye exams. Ask your health care provider how often you should have your eyes checked. Personal lifestyle choices, including: Daily care of your teeth and gums. Regular physical activity. Eating a healthy diet. Avoiding tobacco and drug use. Limiting alcohol use. Practicing safe sex. Taking low-dose aspirin every day. Taking vitamin and mineral supplements as recommended by your health care provider. What happens during an annual well check? The services and screenings done by your health care provider during your annual well check will depend on your age, overall health, lifestyle risk factors, and family history of disease. Counseling  Your health care provider may ask you questions about your: Alcohol use. Tobacco use. Drug use. Emotional well-being. Home and  relationship well-being. Sexual activity. Eating habits. History of falls. Memory and ability to understand (cognition). Work and work Statistician. Reproductive health. Screening  You may have the following tests or measurements: Height, weight, and BMI. Blood pressure. Lipid and cholesterol levels. These may be checked every 5 years, or more frequently if you are over 31 years old. Skin check. Lung cancer screening. You may have this screening every year starting at age 76 if you have a 30-pack-year history of smoking and currently smoke or have quit within the past 15 years. Fecal occult blood test (FOBT) of the stool. You may have this test every year starting at age 20. Flexible sigmoidoscopy or colonoscopy. You may have a sigmoidoscopy every 5 years or a colonoscopy every 10 years starting at age 68. Hepatitis C blood test. Hepatitis B blood test. Sexually transmitted disease (STD) testing. Diabetes screening. This is done by checking your blood sugar (glucose) after you have not eaten for a while (fasting). You may have this done every 1-3 years. Bone density scan. This is done to screen for osteoporosis. You may have this done starting at age 62. Mammogram. This may be done every 1-2 years. Talk to your health care provider about how often you should have regular mammograms. Talk with your health care provider about your test results, treatment options, and if necessary, the need for more tests. Vaccines  Your health care provider may recommend certain vaccines, such as: Influenza vaccine. This is recommended every year. Tetanus, diphtheria, and acellular pertussis (Tdap, Td) vaccine. You may need a Td booster every 10 years. Zoster vaccine. You may need this after age 32. Pneumococcal 13-valent conjugate (PCV13) vaccine. One dose is recommended after age 25. Pneumococcal polysaccharide (PPSV23) vaccine. One dose is recommended after age  17. Talk to your health care provider  about which screenings and vaccines you need and how often you need them. This information is not intended to replace advice given to you by your health care provider. Make sure you discuss any questions you have with your health care provider. Document Released: 01/06/2016 Document Revised: 08/29/2016 Document Reviewed: 10/11/2015 Elsevier Interactive Patient Education  2017 [place] Prevention in the Home Falls can cause injuries. They can happen to people of all ages. There are many things you can do to make your home safe and to help prevent falls. What can I do on the outside of my home? Regularly fix the edges of walkways and driveways and fix any cracks. Remove anything that might make you trip as you walk through a door, such as a raised step or threshold. Trim any bushes or trees on the path to your home. Use bright outdoor lighting. Clear any walking paths of anything that might make someone trip, such as rocks or tools. Regularly check to see if handrails are loose or broken. Make sure that both sides of any steps have handrails. Any raised decks and porches should have guardrails on the edges. Have any leaves, snow, or ice cleared regularly. Use sand or salt on walking paths during winter. Clean up any spills in your garage right away. This includes oil or grease spills. What can I do in the bathroom? Use night lights. Install grab bars by the toilet and in the tub and shower. Do not use towel bars as grab bars. Use non-skid mats or decals in the tub or shower. If you need to sit down in the shower, use a plastic, non-slip stool. Keep the floor dry. Clean up any water that spills on the floor as soon as it happens. Remove soap buildup in the tub or shower regularly. Attach bath mats securely with double-sided non-slip rug tape. Do not have throw rugs and other things on the floor that can make you trip. What can I do in the bedroom? Use night lights. Make sure  that you have a light by your bed that is easy to reach. Do not use any sheets or blankets that are too big for your bed. They should not hang down onto the floor. Have a firm chair that has side arms. You can use this for support while you get dressed. Do not have throw rugs and other things on the floor that can make you trip. What can I do in the kitchen? Clean up any spills right away. Avoid walking on wet floors. Keep items that you use a lot in easy-to-reach places. If you need to reach something above you, use a strong step stool that has a grab bar. Keep electrical cords out of the way. Do not use floor polish or wax that makes floors slippery. If you must use wax, use non-skid floor wax. Do not have throw rugs and other things on the floor that can make you trip. What can I do with my stairs? Do not leave any items on the stairs. Make sure that there are handrails on both sides of the stairs and use them. Fix handrails that are broken or loose. Make sure that handrails are as long as the stairways. Check any carpeting to make sure that it is firmly attached to the stairs. Fix any carpet that is loose or worn. Avoid having throw rugs at the top or bottom of the stairs. If you do have  throw rugs, attach them to the floor with carpet tape. Make sure that you have a light switch at the top of the stairs and the bottom of the stairs. If you do not have them, ask someone to add them for you. What else can I do to help prevent falls? Wear shoes that: Do not have high heels. Have rubber bottoms. Are comfortable and fit you well. Are closed at the toe. Do not wear sandals. If you use a stepladder: Make sure that it is fully opened. Do not climb a closed stepladder. Make sure that both sides of the stepladder are locked into place. Ask someone to hold it for you, if possible. Clearly mark and make sure that you can see: Any grab bars or handrails. First and last steps. Where the edge of  each step is. Use tools that help you move around (mobility aids) if they are needed. These include: Canes. Walkers. Scooters. Crutches. Turn on the lights when you go into a dark area. Replace any light bulbs as soon as they burn out. Set up your furniture so you have a clear path. Avoid moving your furniture around. If any of your floors are uneven, fix them. If there are any pets around you, be aware of where they are. Review your medicines with your doctor. Some medicines can make you feel dizzy. This can increase your chance of falling. Ask your doctor what other things that you can do to help prevent falls. This information is not intended to replace advice given to you by your health care provider. Make sure you discuss any questions you have with your health care provider. Document Released: 10/06/2009 Document Revised: 05/17/2016 Document Reviewed: 01/14/2015 Elsevier Interactive Patient Education  2017 [employer].

## 2022-12-31 NOTE — Progress Notes (Signed)
Subjective:   Jacqueline Daniels is a 87 y.o. female who presents for Medicare Annual (Subsequent) preventive examination.  Review of Systems     Cardiac Risk Factors include: advanced age (>76men, >63 women)     Objective:    Today's Vitals   12/31/22 0849  BP: (!) 158/80  Weight: 135 lb 8 oz (61.5 kg)  Height: 5\' 3"  (1.6 m)   Body mass index is 24 kg/m.     12/31/2022    8:57 AM 08/23/2020    8:38 AM 06/07/2020    3:22 AM 06/06/2020   11:16 AM 05/31/2020    1:20 PM 08/18/2019    3:03 PM 03/18/2017    9:57 AM  Advanced Directives  Does Patient Have a Medical Advance Directive? No Yes Yes Yes Yes Yes Yes  Type of Social research officer, government;Living will Living will [place];Living will [place];Living will [place];Living will   Does patient want to make changes to medical advance directive?   No - Patient declined No - Guardian declined No - Patient declined    Copy of [place] in Chart?  No - copy requested  No - copy requested No - copy requested No - copy requested   Would patient like information on creating a medical advance directive? No - Patient declined          Current Medications (verified) Outpatient Encounter Medications as of 12/31/2022  Medication Sig   baclofen (LIORESAL) 10 MG tablet Take 1 tablet (10 mg total) by mouth 3 (three) times daily as needed for muscle spasms.   fluticasone (FLONASE) 50 MCG/ACT nasal spray Place 2 sprays into both nostrils daily.   gabapentin (NEURONTIN) 100 MG capsule Take 1 capsule (100 mg total) by mouth at bedtime.   HYDROcodone-acetaminophen (NORCO/VICODIN) 5-325 MG tablet Take 1 tablet by mouth every 8 (eight) hours as needed for moderate pain.   Multiple Vitamins-Minerals (MULTIVITAMIN WITH MINERALS) tablet Take 1 tablet by mouth daily.   No facility-administered encounter medications on file as of 12/31/2022.    Allergies  (verified) Patient has no known allergies.   History: Past Medical History:  Diagnosis Date   Arthritis    Bone spur    GERD (gastroesophageal reflux disease)    Muscle spasm    Past Surgical History:  Procedure Laterality Date   ABDOMINAL HYSTERECTOMY  1978   and oophorectomy   APPENDECTOMY  1945   BREAST BIOPSY Left early 80's   benign    BUNIONECTOMY     FOOT SURGERY Bilateral    OOPHORECTOMY     REPLACEMENT TOTAL KNEE Bilateral    TOTAL SHOULDER ARTHROPLASTY Left 06/06/2020   Procedure: TOTAL SHOULDER ARTHROPLASTY-[place];  Surgeon: Lovell Sheehan, MD;  Location: [facility] ORS;  Service: Orthopedics;  Laterality: Left;   Family History  Problem Relation Age of Onset   Breast cancer Mother    Diabetes Mother    Prostate cancer Father    Heart attack Father    Social History   Socioeconomic History   Marital status: Married    Spouse name: Jacqueline Daniels   Number of children: 1   Years of education: 12   Highest education level: High school graduate  Occupational History    Employer: [employer]  Tobacco Use   Smoking status: Former    Packs/day: 1.00    Years: 5.00    Total pack years: 5.00    Types:  Cigarettes    Quit date: 12/23/1973    Years since quitting: 49.0   Smokeless tobacco: Never  Vaping Use   Vaping Use: Never used  Substance and Sexual Activity   Alcohol use: No   Drug use: No   Sexual activity: Not Currently  Other Topics Concern   Not on file  Social History Narrative   Not on file   Social Determinants of Health   Financial Resource Strain: Low Risk  (08/23/2020)   Overall Financial Resource Strain (CARDIA)    Difficulty of Paying Living Expenses: Not hard at all  Food Insecurity: No Food Insecurity (12/31/2022)   Hunger Vital Sign    Worried About Running Out of Food in the Last Year: Never true    Ran Out of Food in the Last Year: Never true  Transportation Needs: No Transportation Needs (12/31/2022)   PRAPARE - Radiographer, therapeutic (Medical): No    Lack of Transportation (Non-Medical): No  Physical Activity: Sufficiently Active (12/31/2022)   Exercise Vital Sign    Days of Exercise per Week: 7 days    Minutes of Exercise per Session: 30 min  Stress: No Stress Concern Present (12/31/2022)   [place]    Feeling of Stress : Only a little  Social Connections: Socially Isolated (12/31/2022)   Social Connection and Isolation Panel [NHANES]    Frequency of Communication with Friends and Family: Twice a week    Frequency of Social Gatherings with Friends and Family: Twice a week    Attends Religious Services: Never    Marine scientist or Organizations: No    Attends Archivist Meetings: Never    Marital Status: Widowed    Tobacco Counseling Counseling given: Not Answered   Clinical Intake:  Pre-visit preparation completed: Yes  Pain : No/denies pain     Nutritional Risks: None Diabetes: No  How often do you need to have someone help you when you read instructions, pamphlets, or other written materials from your doctor or pharmacy?: 1 - Never  Diabetic?no  Interpreter Needed?: No  Information entered by :: Jacqueline Shaggy, LPN   Activities of Daily Living    12/31/2022    8:58 AM 08/20/2022    3:29 PM  In your present state of health, do you have any difficulty performing the following activities:  Hearing? 1 1  Vision? 0 0  Difficulty concentrating or making decisions? 0 0  Walking or climbing stairs? 0 0  Dressing or bathing? 0 0  Doing errands, shopping? 0 0  Preparing Food and eating ? N   Using the Toilet? N   In the past six months, have you accidently leaked urine? N   Do you have problems with loss of bowel control? N   Managing your Medications? N   Managing your Finances? N   Housekeeping or managing your Housekeeping? N     Patient Care Team: Jacqueline Sons, MD as PCP - General (Family  Medicine) Jacqueline Salina, MD as Referring Physician (Physical Medicine and Rehabilitation) Jacqueline Monte, MD as Referring Physician (Orthopedic Surgery) [place], [place] any recent Medical Services you may have received from other than [facility] providers in the past year (date may be approximate).     Assessment:   This is a routine wellness examination for [place].  Hearing/Vision screen Hearing Screening - Comments:: Wears aids Vision Screening - Comments:: Wears glasses-  [name]   Dietary issues and exercise activities discussed: Current Exercise Habits: Home exercise routine, Type of exercise: walking, Time (Minutes): 30, Frequency (Times/Week): 7, Weekly Exercise (Minutes/Week): 210, Intensity: Mild   Goals Addressed             This Visit's Progress    DIET - INCREASE WATER INTAKE         Depression Screen    12/31/2022    8:53 AM 08/20/2022    3:29 PM 05/03/2022    9:20 AM 12/05/2021   10:11 AM 02/28/2021    1:59 PM 11/29/2020    8:59 AM 08/23/2020    8:36 AM  PHQ 2/9 Scores  PHQ - 2 Score 1 0 0 1 0 0 0  PHQ- 9 Score 2   3 5  0     Fall Risk    12/31/2022    8:58 AM 08/20/2022    3:28 PM 05/03/2022    9:19 AM 12/05/2021   10:11 AM 02/28/2021    1:56 PM  Fall Risk   Falls in the past year? 0 0 0 0 1  Number falls in past yr: 0 0 0 0 0  Injury with Fall? 0 0 0 0 1  Risk for fall due to : No Fall Risks No Fall Risks  No Fall Risks History of fall(s)  Follow up Falls prevention discussed;Falls evaluation completed   Falls evaluation completed Falls evaluation completed    FALL RISK PREVENTION PERTAINING TO THE HOME:  Any stairs in or around the home? No  If so, are there any without handrails? No  Home free of loose throw rugs in walkways, pet beds, electrical cords, etc? Yes  Adequate lighting in your home to reduce risk of falls? Yes   ASSISTIVE DEVICES UTILIZED TO PREVENT FALLS:  Life alert? Yes  Use of a cane, walker or w/c? No   Grab bars in the bathroom? No  Shower chair or bench in shower? No  Elevated toilet seat or a handicapped toilet? No   TIMED UP AND GO:  Was the test performed? Yes .  Length of time to ambulate 10 feet: 4 sec.   Gait steady and fast without use of assistive device  Cognitive Function:        12/31/2022    9:02 AM  6CIT Screen  What Year? 0 points  What month? 0 points  What time? 0 points  Count back from 20 0 points  Months in reverse 0 points  Repeat phrase 4 points  Total Score 4 points    Immunizations Immunization History  Administered Date(s) Administered   Fluad Quad(high Dose 65+) 08/18/2019, 10/12/2021   Influenza Split 10/19/2006, 10/25/2007, 09/18/2008, 09/08/2009, 09/30/2010, 09/12/2011   Influenza, High Dose Seasonal PF 10/05/2014, 09/22/2015, 10/02/2016, 09/21/2022   Influenza-Unspecified 10/01/2017, 09/22/2018, 09/23/2020   PFIZER(Purple Top)SARS-COV-2 Vaccination 01/22/2020, 02/12/2020   Pneumococcal Conjugate-13 03/12/2014   Pneumococcal Polysaccharide-23 10/25/2004, 03/18/2017   Respiratory Syncytial Virus Vaccine,Recomb Aduvanted(Arexvy) 12/25/2022   Tdap 10/24/2004, 09/12/2011   Zoster, Live 09/18/2008    TDAP status: Due, Education has been provided regarding the importance of this vaccine. Advised may receive this vaccine at local pharmacy or Health Dept. Aware to provide a copy of the vaccination record if obtained from local pharmacy or Health Dept. Verbalized acceptance and understanding.  Flu Vaccine status: Up to date  Pneumococcal vaccine status: Up to date  Covid-19 vaccine status: Completed vaccines  Qualifies for Shingles Vaccine? Yes   Zostavax completed Yes  Shingrix Completed?: No.    Education has been provided regarding the importance of this vaccine. Patient has been advised to call insurance company to determine out of pocket expense if they have not yet received this vaccine. Advised may also receive vaccine at local  pharmacy or Health Dept. Verbalized acceptance and understanding.  Screening Tests Health Maintenance  Topic Date Due   Zoster Vaccines- Shingrix (1 of 2) Never done   COVID-19 Vaccine (3 - Pfizer risk series) 03/11/2020   DTaP/Tdap/Td (3 - Td or Tdap) 09/11/2021   Medicare Annual Wellness (AWV)  01/01/2024   Pneumonia Vaccine 78+ Years old  Completed   INFLUENZA VACCINE  Completed   DEXA SCAN  Completed   HPV VACCINES  Aged Out    Health Maintenance  Health Maintenance Due  Topic Date Due   Zoster Vaccines- Shingrix (1 of 2) Never done   COVID-19 Vaccine (3 - Pfizer risk series) 03/11/2020   DTaP/Tdap/Td (3 - Td or Tdap) 09/11/2021    Colorectal cancer screening: No longer required.   Mammogram status: No longer required due to age.    Lung Cancer Screening: (Low Dose CT Chest recommended if Age 79-80 years, 30 pack-year currently smoking OR have quit w/in 15years.) does not qualify.   Additional Screening:  Hepatitis C Screening: does not qualify; Completed no  Vision Screening: Recommended annual ophthalmology exams for early detection of glaucoma and other disorders of the eye. Is the patient up to date with their annual eye exam?  Yes  Who is the provider or what is the name of the office in which the patient attends annual eye exams? [name]  If pt is not established with a provider, would they like to be referred to a provider to establish care? No .   Dental Screening: Recommended annual dental exams for proper oral hygiene  Community Resource Referral / Chronic Care Management: CRR required this visit?  No   CCM required this visit?  No      Plan:     I have personally reviewed and noted the following in the patient's chart:   Medical and social history Use of alcohol, tobacco or illicit drugs  Current medications and supplements including opioid prescriptions. Patient is currently taking opioid prescriptions. Information provided to patient  regarding non-opioid alternatives. Patient advised to discuss non-opioid treatment plan with their provider. Functional ability and status Nutritional status Physical activity Advanced directives List of other physicians Hospitalizations, surgeries, and ER visits in previous 12 months Vitals Screenings to include cognitive, depression, and falls Referrals and appointments  In addition, I have reviewed and discussed with patient certain preventive protocols, quality metrics, and best practice recommendations. A written personalized care plan for preventive services as well as general preventive health recommendations were provided to patient.     Hal Hope, LPN   2/[id number]/9470   Nurse Notes: none

## 2023-01-09 ENCOUNTER — Other Ambulatory Visit: Payer: Self-pay | Admitting: Family Medicine

## 2023-01-09 DIAGNOSIS — Z1231 Encounter for screening mammogram for malignant neoplasm of breast: Secondary | ICD-10-CM

## 2023-01-22 ENCOUNTER — Other Ambulatory Visit: Payer: Self-pay | Admitting: Family Medicine

## 2023-01-22 DIAGNOSIS — G8929 Other chronic pain: Secondary | ICD-10-CM

## 2023-01-22 NOTE — Telephone Encounter (Signed)
Medication Refill - Medication: HYDROcodone-acetaminophen (NORCO/VICODIN) 5-325 MG   Has the patient contacted their pharmacy? Yes.   (Agent: If no, request that the patient contact the pharmacy for the refill. If patient does not wish to contact the pharmacy document the reason why and proceed with request.) (Agent: If yes, when and what did the pharmacy advise?)  Preferred Pharmacy (with phone number or street name):  [place], [place] - [address]  [place] [address]  Phone: [phone] Fax: [phone]   Has the patient been seen for an appointment in the last year OR does the patient have an upcoming appointment? Yes.    Agent: Please be advised that RX refills may take up to 3 business days. We ask that you follow-up with your pharmacy.

## 2023-01-22 NOTE — Telephone Encounter (Signed)
Requested medication (s) are due for refill today - yes  Requested medication (s) are on the active medication list -yes  Future visit scheduled -no  Last refill: 12/23/22 #30  Notes to clinic: non delegated Rx  Requested Prescriptions  Pending Prescriptions Disp Refills   HYDROcodone-acetaminophen (NORCO/VICODIN) 5-325 MG tablet 30 tablet 0    Sig: Take 1 tablet by mouth every 8 (eight) hours as needed for moderate pain.     Not Delegated - Analgesics:  Opioid Agonist Combinations Failed - 01/22/2023  2:56 PM      Failed - This refill cannot be delegated      Failed - Urine Drug Screen completed in last 360 days      Passed - Valid encounter within last 3 months    Recent Outpatient Visits           5 months ago Fatigue, unspecified type   [facility] Myles Gip, DO   8 months ago [place] Birdie Sons, MD   8 months ago Environmental and seasonal allergies   [place], Dani Gobble, PA-C   1 year ago DDD (degenerative disc disease), lumbar   [place] Birdie Sons, MD   1 year ago Upper respiratory tract infection, unspecified type   [place] Birdie Sons, MD                 Requested Prescriptions  Pending Prescriptions Disp Refills   HYDROcodone-acetaminophen (NORCO/VICODIN) 5-325 MG tablet 30 tablet 0    Sig: Take 1 tablet by mouth every 8 (eight) hours as needed for moderate pain.     Not Delegated - Analgesics:  Opioid Agonist Combinations Failed - 01/22/2023  2:56 PM      Failed - This refill cannot be delegated      Failed - Urine Drug Screen completed in last 360 days      Passed - Valid encounter within last 3 months    Recent Outpatient Visits           5 months ago Fatigue, unspecified type   [facility] Myles Gip, DO   8 months ago  [place] Birdie Sons, MD   8 months ago Environmental and seasonal allergies   [place], Dani Gobble, PA-C   1 year ago DDD (degenerative disc disease), lumbar   [place] Birdie Sons, MD   1 year ago Upper respiratory tract infection, unspecified type   [facility] Birdie Sons, MD

## 2023-01-23 MED ORDER — HYDROCODONE-ACETAMINOPHEN 5-325 MG PO TABS: 1.0000 | ORAL_TABLET | Freq: Three times a day (TID) | ORAL | 0 refills | Status: DC | PRN

## 2023-02-11 ENCOUNTER — Ambulatory Visit
Admission: RE | Admit: 2023-02-11 | Discharge: 2023-02-11 | Disposition: A | Discharge status: Home / Self Care | Payer: Medicare Other | Source: Ambulatory Visit | Attending: Family Medicine | Admitting: Family Medicine

## 2023-02-11 DIAGNOSIS — Z1231 Encounter for screening mammogram for malignant neoplasm of breast: Secondary | ICD-10-CM | POA: Diagnosis not present

## 2023-04-16 ENCOUNTER — Other Ambulatory Visit: Payer: Self-pay | Admitting: Family Medicine

## 2023-04-16 DIAGNOSIS — G8929 Other chronic pain: Secondary | ICD-10-CM

## 2023-04-16 NOTE — Telephone Encounter (Signed)
Medication Refill - Medication: HYDROcodone-acetaminophen (NORCO/VICODIN) 5-325 MG tablet   Has the patient contacted their pharmacy? No   Preferred Pharmacy (with phone number or street name):  [facility] - [place], [place] - [address] Phone: [phone]  Fax: [phone]     Has the patient been seen for an appointment in the last year OR does the patient have an upcoming appointment? Yes.    Please assist patient further

## 2023-04-16 NOTE — Telephone Encounter (Signed)
Requested medications are due for refill today.  Provider to decide  Requested medications are on the active medications list.  yes  Last refill. 01/23/2023 #30 0 rf  Future visit scheduled.   no  Notes to clinic.  Refill not delegated.    Requested Prescriptions  Pending Prescriptions Disp Refills   HYDROcodone-acetaminophen (NORCO/VICODIN) 5-325 MG tablet 30 tablet 0    Sig: Take 1 tablet by mouth every 8 (eight) hours as needed for moderate pain.     Not Delegated - Analgesics:  Opioid Agonist Combinations Failed - 04/16/2023  4:40 PM      Failed - This refill cannot be delegated      Failed - Urine Drug Screen completed in last 360 days      Failed - Valid encounter within last 3 months    Recent Outpatient Visits           7 months ago Fatigue, unspecified type   [facility] Caro Lar[facility], DO   11 months ago Bronchitis   [facility] Malva Limes, MD   11 months ago Environmental and seasonal allergies   [place] [facility] Mecum, Oswaldo Conroy, PA-C   1 year ago DDD (degenerative disc disease), lumbar   [place] [facility] Malva Limes, MD   1 year ago Upper respiratory tract infection, unspecified type   [facility] Malva Limes, MD

## 2023-04-17 MED ORDER — HYDROCODONE-ACETAMINOPHEN 5-325 MG PO TABS: 1.0000 | ORAL_TABLET | Freq: Three times a day (TID) | ORAL | 0 refills | Status: DC | PRN

## 2023-05-23 ENCOUNTER — Other Ambulatory Visit: Payer: Self-pay | Admitting: Family Medicine

## 2023-05-23 DIAGNOSIS — G8929 Other chronic pain: Secondary | ICD-10-CM

## 2023-06-29 ENCOUNTER — Other Ambulatory Visit: Payer: Self-pay | Admitting: Family Medicine

## 2023-06-29 DIAGNOSIS — M48062 Spinal stenosis, lumbar region with neurogenic claudication: Secondary | ICD-10-CM

## 2023-07-01 ENCOUNTER — Other Ambulatory Visit: Payer: Self-pay | Admitting: Family Medicine

## 2023-07-01 DIAGNOSIS — M48062 Spinal stenosis, lumbar region with neurogenic claudication: Secondary | ICD-10-CM

## 2023-07-01 NOTE — Telephone Encounter (Signed)
Requested Prescriptions  Pending Prescriptions Disp Refills   gabapentin (NEURONTIN) 100 MG capsule [Pharmacy Med Name: GABAPENTIN 100 MG CAP] 90 capsule 0    Sig: TAKE 1 CAPSULE BY MOUTH AT BEDTIME     Neurology: Anticonvulsants - gabapentin Passed - 06/29/2023  8:42 AM      Passed - Cr in normal range and within 360 days    Creatinine  Date Value Ref Range Status  08/28/2012 0.83 0.60 - 1.30 mg/dL Final   Creatinine, Ser  Date Value Ref Range Status  08/20/2022 0.78 0.57 - 1.00 mg/dL Final         Passed - Completed PHQ-2 or PHQ-9 in the last 360 days      Passed - Valid encounter within last 12 months    Recent Outpatient Visits           10 months ago Fatigue, unspecified type   [facility] Caro Laroche, DO   1 year ago Bronchitis   [facility] Malva Limes, MD   1 year ago Environmental and seasonal allergies   [place] [facility] Mecum, Oswaldo Conroy, PA-C   1 year ago DDD (degenerative disc disease), lumbar   [place] [facility] Malva Limes, MD   1 year ago Upper respiratory tract infection, unspecified type   [facility] Malva Limes, MD

## 2023-07-01 NOTE — Telephone Encounter (Signed)
Unable to refill per protocol, Rx request was refilled 07/01/23, duplicate request.  Requested Prescriptions  Pending Prescriptions Disp Refills   gabapentin (NEURONTIN) 100 MG capsule 90 capsule 0    Sig: Take 1 capsule (100 mg total) by mouth at bedtime.     Neurology: Anticonvulsants - gabapentin Passed - 07/01/2023 10:35 AM      Passed - Cr in normal range and within 360 days    Creatinine  Date Value Ref Range Status  08/28/2012 0.83 0.60 - 1.30 mg/dL Final   Creatinine, Ser  Date Value Ref Range Status  08/20/2022 0.78 0.57 - 1.00 mg/dL Final         Passed - Completed PHQ-2 or PHQ-9 in the last 360 days      Passed - Valid encounter within last 12 months    Recent Outpatient Visits           10 months ago Fatigue, unspecified type   [facility] Caro Laroche, DO   1 year ago Bronchitis   [place] [facility] Malva Limes, MD   1 year ago Environmental and seasonal allergies   [place] [facility] Mecum, Oswaldo Conroy, PA-C   1 year ago DDD (degenerative disc disease), lumbar   [place] [facility] Malva Limes, MD   1 year ago Upper respiratory tract infection, unspecified type   [facility] Malva Limes, MD

## 2023-07-01 NOTE — Telephone Encounter (Signed)
Medication Refill - Medication: gabapentin (NEURONTIN) 100 MG capsule  Has the patient contacted their pharmacy? Yes.   Pt states that she is needing a new prescription sent to the pharmacy.    Preferred Pharmacy (with phone number or street name): [facility] - [place], [place] - [address]  Phone: [phone] Fax: [phone]  Has the patient been seen for an appointment in the last year OR does the patient have an upcoming appointment? Yes.    Agent: Please be advised that RX refills may take up to 3 business days. We ask that you follow-up with your pharmacy.

## 2023-07-12 ENCOUNTER — Ambulatory Visit
Admission: RE | Admit: 2023-07-12 | Discharge: 2023-07-12 | Disposition: A | Discharge status: Home / Self Care | Payer: Medicare Other | Source: Ambulatory Visit | Attending: Physician Assistant | Admitting: Physician Assistant

## 2023-07-12 ENCOUNTER — Encounter: Payer: Self-pay | Admitting: Physician Assistant

## 2023-07-12 ENCOUNTER — Ambulatory Visit
Admission: RE | Admit: 2023-07-12 | Discharge: 2023-07-12 | Disposition: A | Discharge status: Home / Self Care | Payer: Medicare Other | Attending: Physician Assistant | Admitting: Physician Assistant

## 2023-07-12 ENCOUNTER — Ambulatory Visit: Payer: Medicare Other | Admitting: Physician Assistant

## 2023-07-12 VITALS — BP 170/80 | HR 84 | Ht 63.0 in | Wt 133.2 lb

## 2023-07-12 DIAGNOSIS — M79671 Pain in right foot: Secondary | ICD-10-CM | POA: Insufficient documentation

## 2023-07-12 MED ORDER — METHYLPREDNISOLONE ACETATE 40 MG/ML IJ SUSP
40.0000 mg | Freq: Once | INTRAMUSCULAR | Status: AC
  Administered 2023-07-12: 40 mg via INTRAMUSCULAR

## 2023-07-12 NOTE — Progress Notes (Unsigned)
Established patient visit  Patient: Jacqueline Daniels   DOB: [birth year]   87 y.o. Female  MRN: [id number] Visit Date: 07/12/2023  Today's healthcare provider: Debera Lat, PA-C   Chief Complaint  Patient presents with  . Acute Visit    Right foot pain , off and on all week,  no injury  no bruising , not taking any for the pain , not using ice or heat , muscle rub , no sweeling    Subjective    HPI HPI     Acute Visit    Additional comments: Right foot pain , off and on all week,  no injury  no bruising , not taking any for the pain , not using ice or heat , muscle rub , no sweeling       Last edited by Annabell Sabal, CMA on 07/12/2023  1:21 PM.      *** Discussed the use of AI scribe software for clinical note transcription with the patient, who gave verbal consent to proceed.  History of Present Illness   The patient presents with a week-long history of severe foot pain, which has been progressively worsening. The pain is described as excruciating, particularly when walking, and is rated as an 8 out of 10 in severity. The patient also notes some puffiness in the right foot, which is a new symptom. The left foot has occasional pain in the same location, but it is not as severe or frequent. The patient has not taken any pain medication for the foot pain, but has hydrocodone at home for back pain. The patient is willing to take over-the-counter medication such as Tylenol or Naprosyn, but prefers not to take hydrocodone due to its side effects. The patient also has a history of osteoarthritis, but the back pain has been relatively well-controlled recently.           12/31/2022    8:53 AM 08/20/2022    3:29 PM 05/03/2022    9:20 AM  Depression screen PHQ 2/9  Decreased Interest  0 0  Down, Depressed, Hopeless 1 0 0  PHQ - 2 Score 1 0 0  Altered sleeping 0    Tired, decreased energy 1    Change in appetite 0    Feeling bad or failure about yourself  0    Trouble concentrating  0    Moving slowly or fidgety/restless 0    Suicidal thoughts 0    PHQ-9 Score 2    Difficult doing work/chores Not difficult at all        02/28/2021    2:31 PM  GAD 7 : Generalized Anxiety Score  Nervous, Anxious, on Edge 1  Control/stop worrying 1  Worry too much - different things 1  Trouble relaxing 0  Restless 0  Easily annoyed or irritable 1  Afraid - awful might happen 0  Total GAD 7 Score 4  Anxiety Difficulty Not difficult at all    Medications: Outpatient Medications Prior to Visit  Medication Sig  . gabapentin (NEURONTIN) 100 MG capsule TAKE 1 CAPSULE BY MOUTH AT BEDTIME  . Multiple Vitamins-Minerals (MULTIVITAMIN WITH MINERALS) tablet Take 1 tablet by mouth daily.  . baclofen (LIORESAL) 10 MG tablet Take 1 tablet (10 mg total) by mouth 3 (three) times daily as needed for muscle spasms. (Patient not taking: Reported on 07/12/2023)  . fluticasone (FLONASE) 50 MCG/ACT nasal spray Place 2 sprays into both nostrils daily. (Patient not taking: Reported on 07/12/2023)  . HYDROcodone-acetaminophen (NORCO/VICODIN) 5-325  MG tablet Take 1 tablet by mouth every 8 (eight) hours as needed for moderate pain. (Patient not taking: Reported on 07/12/2023)   No facility-administered medications prior to visit.    Review of Systems  All other systems reviewed and are negative. Except see HPI   {Insert previous labs (optional):23779}  {See past labs  Heme  Chem  Endocrine  Serology  Results Review (optional):1}   Objective    BP (!) 170/80   Pulse 84   Ht 5\' 3"  (1.6 m)   Wt 133 lb 3.2 oz (60.4 kg)   SpO2 98%   BMI 23.60 kg/m  {Insert last BP/Wt (optional):23777}  {See vitals history (optional):1}  Physical Exam   No results found for any visits on 07/12/23.  Assessment & Plan        Foot Pain: Severe pain in the foot, particularly under and along the side, with an intensity of 8/10, worsening with walking. Suspected plantar fasciitis, but requires  confirmation. -Order foot X-ray to confirm diagnosis. -Administer 40mg  Depo Medrol injection for pain relief. -Advise patient to perform stretching exercises. -Recommend over-the-counter Tylenol for additional pain control. -If no improvement in two weeks, patient to return for reassessment and possible referral to orthopedics.         Return in about 2 weeks (around 07/26/2023) for FU for foot pain.     The patient was advised to call back or seek an in-person evaluation if the symptoms worsen or if the condition fails to improve as anticipated.  I discussed the assessment and treatment plan with the patient. The patient was provided an opportunity to ask questions and all were answered. The patient agreed with the plan and demonstrated an understanding of the instructions.  I, Debera Lat, PA-C have reviewed all documentation for this visit. The documentation on  07/12/23 for the exam, diagnosis, procedures, and orders are all accurate and complete.  Debera Lat, [facility], MMS [facility] [phone] (phone) [phone] (fax)  [facility]

## 2023-07-21 NOTE — Progress Notes (Signed)
Please let patient know that she has osteoarthritis and "Moderate to large plantar calcaneal heel spur", which could be contributing to plantar fasciitis.  We discussed this condition during her visit. Advised to continue with stretching exercises, night splints, over-the-counter pain medications as well as over-the-counter anti-inflammatory topical Voltaren gel. I will place referral to to podiatry for proper assessment if patient agrees

## 2023-07-22 ENCOUNTER — Telehealth: Payer: Self-pay

## 2023-07-22 NOTE — Telephone Encounter (Signed)
Lvm for patient to call the office regarding messge below

## 2023-07-22 NOTE — Telephone Encounter (Signed)
Pt called in states, she doesn't need the podiatrist referral. Her foot feels better now

## 2023-07-22 NOTE — Telephone Encounter (Signed)
-----   Message from [place] sent at 07/21/2023  9:45 PM EDT ----- Please let patient know that she has osteoarthritis and "Moderate to large plantar calcaneal heel spur", which could be contributing to plantar fasciitis.  We discussed this condition during her visit. Advised to continue with stretching exercises, night splints, over-the-counter pain medications as well as over-the-counter anti-inflammatory topical Voltaren gel. I will place referral to to podiatry for proper assessment if patient agrees

## 2023-08-02 ENCOUNTER — Ambulatory Visit: Payer: Medicare Other | Admitting: Family Medicine

## 2023-08-02 ENCOUNTER — Encounter: Payer: Self-pay | Admitting: Family Medicine

## 2023-08-02 VITALS — BP 131/82 | HR 86 | Temp 97.6°F | Resp 12 | Ht 63.0 in | Wt 131.6 lb

## 2023-08-02 DIAGNOSIS — F418 Other specified anxiety disorders: Secondary | ICD-10-CM | POA: Diagnosis not present

## 2023-08-02 MED ORDER — HYDROCODONE-ACETAMINOPHEN 5-325 MG PO TABS: 1.0000 | ORAL_TABLET | Freq: Four times a day (QID) | ORAL | 0 refills | Status: DC | PRN

## 2023-08-02 MED ORDER — ALPRAZOLAM 0.25 MG PO TABS: 0.1250 mg | ORAL_TABLET | Freq: Every day | ORAL | 2 refills | Status: DC | PRN

## 2023-08-02 NOTE — Progress Notes (Signed)
Established patient visit   Patient: Jacqueline Daniels   DOB: 1936-11-11   87 y.o. Female  MRN: [id number] Visit Date: 08/02/2023  Today's healthcare provider: Mila Merry, MD    Subjective    Discussed the use of AI scribe software for clinical note transcription with the patient, who gave verbal consent to proceed.  History of Present Illness   The patient, aged 87, presents with a recent onset of anxiety, particularly related to navigating to new or unfamiliar locations. This anxiety has been ongoing for approximately six to eight months and is triggered by situations such as having an appointment or needing to go somewhere she hasn't been recently. The patient describes the feeling as being "worked up" and "beside themselves," even though she acknowledges that her concerns are often unfounded. Despite this, she has not experienced any instances of getting lost or not finding her way.  In addition to the anxiety, the patient reports experiencing plantar fasciitis, which is causing significant heel pain and difficulty walking. She requests a renewal of her hydrocodone prescription for pain management, which she takes as needed and not on a daily basis. She also takes gabapentin nightly for muscle spasms and to aid sleep, and reports no issues with this medication.  The patient maintains an active lifestyle, walking a mile every morning and tending to her cats and birds. She has considered joining a gym but feels she is sufficiently active at home. She reports good sleep quality and no issues with insomnia. Despite her anxiety and physical discomfort, the patient perceives herself to be in good health for her age and takes pride in her limited need for medication.       Medications: Outpatient Medications Prior to Visit  Medication Sig   gabapentin (NEURONTIN) 100 MG capsule TAKE 1 CAPSULE BY MOUTH AT BEDTIME   Multiple Vitamins-Minerals (MULTIVITAMIN WITH MINERALS) tablet Take 1  tablet by mouth daily.   [DISCONTINUED] HYDROcodone-acetaminophen (NORCO/VICODIN) 5-325 MG tablet Take 1 tablet by mouth every 6 (six) hours as needed for moderate pain.   No facility-administered medications prior to visit.   Review of Systems     Objective    BP 131/82 (BP Location: Left Arm, Patient Position: Sitting, Cuff Size: Normal)   Pulse 86   Temp 97.6 F (36.4 C) (Temporal)   Resp 12   Ht 5\' 3"  (1.6 m)   Wt 131 lb 9.6 oz (59.7 kg)   SpO2 99%   BMI 23.31 kg/m   Physical Exam   General appearance: Well developed, well nourished female, cooperative and in no acute distress Head: Normocephalic, without obvious abnormality, atraumatic Respiratory: Respirations even and unlabored, normal respiratory rate Extremities: All extremities are intact.  Skin: Skin color, texture, turgor normal. No rashes seen  Psych: Appropriate mood and affect. Neurologic: Mental status: Alert, oriented to person, place, and time, thought content appropriate.    Assessment & Plan     Assessment and Plan    Situational anxiety New onset of anxiety symptoms over the past 6-8 months, particularly related to navigating to new locations or appointments. No other significant psychiatric history. Discussed the benefits and risks of Xanax (rapid onset, effective, but potential for dependence and sedation) vs Paxil (daily use, preventive, non-sedating, non-habit forming). -Prescribe Xanax to be used as needed for anxiety symptoms. Start with half a tablet and increase to a full tablet if needed.  Chronic Pain Reports occasional use of Hydrocodone for pain management. Currently experiencing plantar fasciitis. -  Continue Hydrocodone as needed for pain.  Neuropathy Currently managed with nightly Gabapentin. -Continue Gabapentin 1 tablet at bedtime.  General Health Maintenance -Encouraged to maintain active lifestyle, including daily walking and activities around the house. -Consider joining a gym  or exercise program for additional physical activity. -Refill prescriptions for Hydrocodone and Gabapentin to be picked up at [facility] after lunch. -Follow-up if anxiety symptoms do not improve with Xanax or if daily use becomes necessary.          Mila Merry, MD  [facility] [phone] (phone) [phone] (fax)  [facility]

## 2023-09-27 ENCOUNTER — Other Ambulatory Visit: Payer: Self-pay | Admitting: Family Medicine

## 2023-09-27 DIAGNOSIS — M48062 Spinal stenosis, lumbar region with neurogenic claudication: Secondary | ICD-10-CM

## 2023-09-30 ENCOUNTER — Telehealth: Payer: Self-pay | Admitting: Family Medicine

## 2023-09-30 ENCOUNTER — Other Ambulatory Visit: Payer: Self-pay | Admitting: Family Medicine

## 2023-09-30 NOTE — Telephone Encounter (Signed)
Copied from CRM [id number]. Topic: General - Other >> Sep 30, 2023 12:45 PM Everette C wrote: Reason for CRM: Medication Refill - Medication: HYDROcodone-acetaminophen (NORCO/VICODIN) 5-325 MG tablet [[id number]] The patient shares that they would like clarity regarding their quantity and directions   Has the patient contacted their pharmacy? Yes.   (Agent: If no, request that the patient contact the pharmacy for the refill. If patient does not wish to contact the pharmacy document the reason why and proceed with request.) (Agent: If yes, when and what did the pharmacy advise?)  Preferred Pharmacy (with phone number or street name): [facility] - [place], [place] - [address] [place] [address] Phone: [phone] Fax: [phone] Hours: Not open 24 hours   Has the patient been seen for an appointment in the last year OR does the patient have an upcoming appointment? Yes.    Agent: Please be advised that RX refills may take up to 3 business days. We ask that you follow-up with your pharmacy.

## 2023-09-30 NOTE — Telephone Encounter (Signed)
Requested medications are due for refill today.  yes  Requested medications are on the active medications list.  yes  Last refill. 07/01/2023 #90 0 rf  Future visit scheduled.   yes  Notes to clinic.  Labs are expired.    Requested Prescriptions  Pending Prescriptions Disp Refills   gabapentin (NEURONTIN) 100 MG capsule [Pharmacy Med Name: GABAPENTIN 100 MG CAP] 90 capsule 0    Sig: TAKE 1 CAPSULE BY MOUTH AT BEDTIME     Neurology: Anticonvulsants - gabapentin Failed - 09/27/2023  6:02 PM      Failed - Cr in normal range and within 360 days    Creatinine  Date Value Ref Range Status  08/28/2012 0.83 0.60 - 1.30 mg/dL Final   Creatinine, Ser  Date Value Ref Range Status  08/20/2022 0.78 0.57 - 1.00 mg/dL Final         Passed - Completed PHQ-2 or PHQ-9 in the last 360 days      Passed - Valid encounter within last 12 months    Recent Outpatient Visits           1 month ago Situational anxiety   [facility] Malva Limes, MD   2 months ago Foot pain, right   [place] [facility] [place], [place], PA-C   1 year ago Fatigue, unspecified type   [facility] Caro Laroche, DO   1 year ago Bronchitis   [facility] Malva Limes, MD   1 year ago Environmental and seasonal allergies   [place] [facility] [place], Oswaldo [place], [place]

## 2023-09-30 NOTE — Telephone Encounter (Signed)
Pharmacy requests clarification on , please advise.

## 2023-09-30 NOTE — Telephone Encounter (Signed)
Pharmacist says to please disregard. No further clarification needed

## 2023-09-30 NOTE — Telephone Encounter (Signed)
Please contact pharmacy, I don't know what they need clarification of

## 2023-11-12 ENCOUNTER — Other Ambulatory Visit: Payer: Self-pay | Admitting: Family Medicine

## 2023-11-12 ENCOUNTER — Ambulatory Visit: Payer: Self-pay | Admitting: *Deleted

## 2023-11-12 ENCOUNTER — Telehealth: Payer: Self-pay | Admitting: Family Medicine

## 2023-11-12 NOTE — Telephone Encounter (Signed)
Already called earlier for request in a separate refill encounter.

## 2023-11-12 NOTE — Telephone Encounter (Signed)
  Chief Complaint: Knee and shoulder pain Symptoms: States pain from arthritis when weather gets colder. Frequency: weeks Pertinent Negatives: Patient denies  Disposition: [] ED /[] Urgent Care (no appt availability in office) / [x] Appointment(In office/virtual)/ []  [place] [facility]/ [] Home Care/ [] Refused Recommended Disposition /[] [facility]/ []  Follow-up with PCP Additional Notes:  Pt requesting baclofen. Advised will need appt.  Secured appt for tomorrow with Dr. Sherrie Mustache. Answer Assessment - Initial Assessment Questions 1. LOCATION and RADIATION: "Where is the pain located?"      Knee pain "Replacement" 2. QUALITY: "What does the pain feel like?"  (e.g., sharp, dull, aching, burning)     Arthritis. 3. SEVERITY: "How bad is the pain?" "What does it keep you from doing?"   (Scale 1-10; or mild, moderate, severe)   -  MILD (1-3): doesn't interfere with normal activities    -  MODERATE (4-7): interferes with normal activities (e.g., work or school) or awakens from sleep, limping    -  SEVERE (8-10): excruciating pain, unable to do any normal activities, unable to walk      4. ONSET: "When did the pain start?" "Does it come and go, or is it there all the time?"     With weather change, cold 5. RECURRENT: "Have you had this pain before?" If Yes, ask: "When, and what happened then?"     With arthritis 6. SETTING: "Has there been any recent work, exercise or other activity that involved that part of the body?"      No. Weather change. 7. AGGRAVATING FACTORS: "What makes the knee pain worse?" (e.g., walking, climbing stairs, running)     Golf 8. ASSOCIATED SYMPTOMS: "Is there any swelling or redness of the knee?"     no 9. OTHER SYMPTOMS: "Do you have any other symptoms?" (e.g., chest pain, difficulty breathing, fever, calf pain)     Shoulder pain, needs replaced. Fingers stiff, left hip might need replaced  Protocols used: Knee Pain-A-AH

## 2023-11-12 NOTE — Telephone Encounter (Signed)
Summary: muscular discomfort / rx req   The patient has began to experience discomfort in their calves and under their left breast as a result of the recent change in weather  The patient would like to be prescribed baclofen (LIORESAL) 10 MG tablet [[id number]]  to help with their discomfort  Please contact the patient further when available

## 2023-11-12 NOTE — Telephone Encounter (Signed)
Medication Refill -  Most Recent Primary Care Visit:  Provider: Malva Limes  Department: [facility]  Visit Type: OFFICE VISIT  Date: 08/02/2023  Medication: HYDROcodone-acetaminophen (NORCO/VICODIN) 5-325 MG tablet   Has the patient contacted their pharmacy? No   Is this the correct pharmacy for this prescription? Yes If no, delete pharmacy and type the correct one.  This is the patient's preferred pharmacy:  [facility] - [place], [place] - [address] [place] [address] Phone: [phone] Fax: [phone]   Has the prescription been filled recently? No  Is the patient out of the medication? Yes  Has the patient been seen for an appointment in the last year OR does the patient have an upcoming appointment? Yes  Can we respond through MyChart? No  The patient states she only has 1 left and needs a refill on this as soon possible. Please assist further

## 2023-11-12 NOTE — Telephone Encounter (Unsigned)
Copied from CRM [id number]. Topic: General - Other >> Nov 12, 2023 10:12 AM Everette C wrote: Reason for CRM: Medication Refill - Most Recent Primary Care Visit:  Provider: Malva Limes Department: [facility] Visit Type: OFFICE VISIT Date: 08/02/2023  Medication: HYDROcodone-acetaminophen (NORCO/VICODIN) 5-325 MG tablet [[id number]]  Has the patient contacted their pharmacy? No (Agent: If no, request that the patient contact the pharmacy for the refill. If patient does not wish to contact the pharmacy document the reason why and proceed with request.) (Agent: If yes, when and what did the pharmacy advise?)  Is this the correct pharmacy for this prescription? Yes If no, delete pharmacy and type the correct one.  This is the patient's preferred pharmacy:  [facility] - [place], [place] - [address] [place] [address] Phone: [phone] Fax: [phone]   Has the prescription been filled recently? Yes  Is the patient out of the medication? Yes  Has the patient been seen for an appointment in the last year OR does the patient have an upcoming appointment? Yes  Can we respond through MyChart? No  Agent: Please be advised that Rx refills may take up to 3 business days. We ask that you follow-up with your pharmacy.

## 2023-11-13 ENCOUNTER — Ambulatory Visit: Payer: Medicare Other | Admitting: Family Medicine

## 2023-11-13 VITALS — BP 167/103 | HR 76 | Temp 98.7°F | Ht 63.0 in | Wt 135.0 lb

## 2023-11-13 DIAGNOSIS — E2839 Other primary ovarian failure: Secondary | ICD-10-CM

## 2023-11-13 DIAGNOSIS — M150 Primary generalized (osteo)arthritis: Secondary | ICD-10-CM | POA: Diagnosis not present

## 2023-11-13 DIAGNOSIS — F418 Other specified anxiety disorders: Secondary | ICD-10-CM | POA: Diagnosis not present

## 2023-11-13 DIAGNOSIS — M5416 Radiculopathy, lumbar region: Secondary | ICD-10-CM

## 2023-11-13 NOTE — Telephone Encounter (Signed)
Requested medication (s) are due for refill today: Yes  Requested medication (s) are on the active medication list: Yes  Last refill:  09/30/23 #30, 0RF  Future visit scheduled: No  Notes to clinic:  Unable to refill per protocol, cannot delegate.      Requested Prescriptions  Pending Prescriptions Disp Refills   HYDROcodone-acetaminophen (NORCO/VICODIN) 5-325 MG tablet 30 tablet 0    Sig: Take 1 tablet by mouth every 6 (six) hours as needed for moderate pain (pain score 4-6).     Not Delegated - Analgesics:  Opioid Agonist Combinations Failed - 11/12/2023 11:29 AM      Failed - This refill cannot be delegated      Failed - Urine Drug Screen completed in last 360 days      Failed - Valid encounter within last 3 months    Recent Outpatient Visits           Today Estrogen deficiency   [facility] Malva Limes, MD   3 months ago Situational anxiety   [place] [facility] Malva Limes, MD   4 months ago Foot pain, right   [place] [facility] [place], [place], PA-C   1 year ago Fatigue, unspecified type   [facility] Caro Laroche, DO   1 year ago Bronchitis   [facility] Malva Limes, MD

## 2023-11-26 NOTE — Progress Notes (Signed)
Established patient visit   Patient: Jacqueline Daniels   DOB: 12/14/1936   87 y.o. Female  MRN: [id number] Visit Date: 11/13/2023  Today's healthcare provider: Mila Merry, MD   Chief Complaint  Patient presents with   Knee Pain    Patient has had pain on and off for several years with the last few months getting worse.     Shoulder Pain    Patient has had pain on and off for years with the last few months getting worse.   Subjective    Discussed the use of AI scribe software for clinical note transcription with the patient, who gave verbal consent to proceed.  History of Present Illness   The patient, with a history of chronic pain and anxiety, presents for a routine follow-up and medication refill. She reports continued anxiety, particularly related to driving and finding her way to new locations, which she describes as a recent development. She has been managing this anxiety with alprazolam, which she takes as needed when she feels particularly anxious or worked up. She expresses a desire to understand the root cause of this anxiety, but is otherwise satisfied with her current management plan.  The patient also reports chronic pain, particularly in her knees and shoulder, which she manages with hydrocodone as needed. She describes the pain as intermittent and not severe enough to limit her daily activities, which include walking a mile daily. She expresses a desire to have the hydrocodone available for when the pain becomes particularly bothersome.  Additionally, the patient has been taking gabapentin nightly for the past two years, originally prescribed for sciatica. She is unsure of the purpose of this medication, but continues to take it as it does not seem to cause any adverse effects. She also reports occasional weakness in her left hip, which sometimes causes her to lose balance, but denies any significant pain in this area.  The patient is otherwise satisfied with her  health status and is committed to maintaining her current level of activity. She expresses a desire to stay as healthy as possible and manage any health issues as they arise.       Medications: Outpatient Medications Prior to Visit  Medication Sig   ALPRAZolam (XANAX) 0.25 MG tablet Take 0.5-1 tablets (0.125-0.25 mg total) by mouth daily as needed for anxiety.   gabapentin (NEURONTIN) 100 MG capsule TAKE 1 CAPSULE BY MOUTH AT BEDTIME   HYDROcodone-acetaminophen (NORCO/VICODIN) 5-325 MG tablet Take 1 tablet by mouth every 6 (six) hours as needed for moderate pain.   Multiple Vitamins-Minerals (MULTIVITAMIN WITH MINERALS) tablet Take 1 tablet by mouth daily.   No facility-administered medications prior to visit.   Review of Systems     Objective    BP (!) 167/103 (BP Location: Left Arm, Patient Position: Sitting, Cuff Size: Normal)   Pulse 76   Temp 98.7 F (37.1 C) (Oral)   Ht 5\' 3"  (1.6 m)   Wt 135 lb (61.2 kg)   SpO2 99%   BMI 23.91 kg/m   Physical Exam   General: Appearance:    Well developed, well nourished female in no acute distress  Eyes:    PERRL, conjunctiva/corneas clear, EOM's intact       Lungs:     Clear to auscultation bilaterally, respirations unlabored  Heart:    Normal heart rate. Normal rhythm. No murmurs, rubs, or gallops.    MS:   All extremities are intact.    Neurologic:  Awake, alert, oriented x 3. No apparent focal neurological defect.           Assessment & Plan        Anxiety Reports of anxiety, particularly when driving. Currently managed with Alprazolam. -Continue Alprazolam as needed for anxiety.  Chronic Pain Reports of intermittent pain in knees and shoulder, managed with Hydrocodone and Gabapentin. -Continue Hydrocodone as needed for pain. -Continue Gabapentin nightly for nerve pain and muscle spasms.  Left Hip Pain Reports of occasional weakness and pain in left hip, possibly due to arthritis. -Monitor symptoms and continue to  stay active.  General Health Maintenance -Order bone density test due to last test being eight years ago. -Consider Shingles vaccine, discuss with pharmacy. -Continue annual flu vaccine and maintain COVID-19 vaccination status. -Follow-up as needed for prescription refills and symptom management.    No follow-ups on file.      Mila Merry, MD  [facility] [phone] (phone) [phone] (fax)  [facility]

## 2023-12-03 ENCOUNTER — Other Ambulatory Visit: Payer: Self-pay | Admitting: Family Medicine

## 2024-01-06 ENCOUNTER — Other Ambulatory Visit: Payer: Self-pay | Admitting: Family Medicine

## 2024-01-07 ENCOUNTER — Other Ambulatory Visit: Payer: Self-pay | Admitting: Family Medicine

## 2024-01-07 NOTE — Telephone Encounter (Signed)
 Requested medication (s) are due for refill today - yes  Requested medication (s) are on the active medication list -yes  Future visit scheduled -no  Last refill: 12/04/23 #30  Notes to clinic: non delegated Rx  Requested Prescriptions  Pending Prescriptions Disp Refills   HYDROcodone -acetaminophen  (NORCO/VICODIN) 5-325 MG tablet 30 tablet 0    Sig: Take 1 tablet by mouth every 6 (six) hours as needed for moderate pain (pain score 4-6).     Not Delegated - Analgesics:  Opioid Agonist Combinations Failed - 01/07/2024  9:35 AM      Failed - This refill cannot be delegated      Failed - Urine Drug Screen completed in last 360 days      Passed - Valid encounter within last 3 months    Recent Outpatient Visits           1 month ago Estrogen deficiency   [facility] Gasper Nancyann BRAVO, MD   5 months ago Situational anxiety   [facility] Gasper Nancyann BRAVO, MD   5 months ago Foot pain, right   [facility] [place], [place], PA-C   1 year ago Fatigue, unspecified type   [facility] Madelon Donald HERO, DO   1 year ago Bronchitis   [facility] Gasper Nancyann BRAVO, MD                 Requested Prescriptions  Pending Prescriptions Disp Refills   HYDROcodone -acetaminophen  (NORCO/VICODIN) 5-325 MG tablet 30 tablet 0    Sig: Take 1 tablet by mouth every 6 (six) hours as needed for moderate pain (pain score 4-6).     Not Delegated - Analgesics:  Opioid Agonist Combinations Failed - 01/07/2024  9:35 AM      Failed - This refill cannot be delegated      Failed - Urine Drug Screen completed in last 360 days      Passed - Valid encounter within last 3 months    Recent Outpatient Visits           1 month ago Estrogen deficiency   [facility] Gasper Nancyann BRAVO, MD   5 months ago Situational anxiety   [facility] Gasper Nancyann BRAVO, MD   5 months ago Foot pain, right   [facility], [place], PA-C   1 year ago Fatigue, unspecified type   [facility] Donald HERO, DO   1 year ago Bronchitis   [facility] Gasper Nancyann BRAVO, MD

## 2024-01-07 NOTE — Telephone Encounter (Signed)
 Medication Refill -  Most Recent Primary Care Visit:  Provider: GASPER NANCYANN BRAVO  Department: [facility]  Visit Type: OFFICE VISIT  Date: 11/13/2023  Medication: HYDROcodone -acetaminophen  (NORCO/VICODIN) 5-325 MG tablet   Has the patient contacted their pharmacy? Yes (Agent: If no, request that the patient contact the pharmacy for the refill. If patient does not wish to contact the pharmacy document the reason why and proceed with request.) (Agent: If yes, when and what did the pharmacy advise?)  Is this the correct pharmacy for this prescription? Yes If no, delete pharmacy and type the correct one.  This is the patient's preferred pharmacy:  [facility] - [place], [place] - [address] [place] [address] Phone: [phone] Fax: [phone]   Has the prescription been filled recently? No  Is the patient out of the medication? Yes  Has the patient been seen for an appointment in the last year OR does the patient have an upcoming appointment? Yes  Can we respond through MyChart? No  Agent: Please be advised that Rx refills may take up to 3 business days. We ask that you follow-up with your pharmacy.

## 2024-01-28 ENCOUNTER — Ambulatory Visit: Payer: Medicare Other

## 2024-01-28 VITALS — BP 160/80 | Ht 63.0 in | Wt 135.8 lb

## 2024-01-28 DIAGNOSIS — Z0000 Encounter for general adult medical examination without abnormal findings: Secondary | ICD-10-CM

## 2024-01-28 NOTE — Progress Notes (Signed)
 Subjective:   Jacqueline Daniels is a 88 y.o. female who presents for Medicare Annual (Subsequent) preventive examination.  Visit Complete: In person  Cardiac Risk Factors include: advanced age (>79men, >15 women);dyslipidemia     Objective:    Today's Vitals   01/28/24 0922 01/28/24 0926  BP: (!) 160/80   Weight: 135 lb 12.8 oz (61.6 kg)   Height: 5' 3 (1.6 m)   PainSc:  4    Body mass index is 24.06 kg/m.     01/28/2024    9:39 AM 12/31/2022    8:57 AM 08/23/2020    8:38 AM 06/07/2020    3:22 AM 06/06/2020   11:16 AM 05/31/2020    1:20 PM 08/18/2019    3:03 PM  Advanced Directives  Does Patient Have a Medical Advance Directive? No No Yes Yes Yes Yes Yes  Type of Surveyor, Minerals;Living will Living will Healthcare Power of [place];Living will Healthcare Power of [place];Living will Healthcare Power of [place];Living will  Does patient want to make changes to medical advance directive?    No - Patient declined No - Guardian declined No - Patient declined   Copy of Healthcare Power of Attorney in Chart?   No - copy requested  No - copy requested No - copy requested No - copy requested  Would patient like information on creating a medical advance directive? No - Patient declined No - Patient declined         Current Medications (verified) Outpatient Encounter Medications as of 01/28/2024  Medication Sig   ALPRAZolam  (XANAX ) 0.25 MG tablet Take 0.5-1 tablets (0.125-0.25 mg total) by mouth daily as needed for anxiety.   baclofen  (LIORESAL ) 10 MG tablet TAKE 1 TABLET BY MOUTH THREE TIMES DAILYAS NEEDED FOR MUSCLE SPASMS   gabapentin  (NEURONTIN ) 100 MG capsule TAKE 1 CAPSULE BY MOUTH AT BEDTIME   HYDROcodone -acetaminophen  (NORCO/VICODIN) 5-325 MG tablet Take 1 tablet by mouth every 6 (six) hours as needed for moderate pain (pain score 4-6).   Multiple Vitamins-Minerals (MULTIVITAMIN WITH MINERALS) tablet Take 1 tablet by mouth daily.   No  facility-administered encounter medications on file as of 01/28/2024.    Allergies (verified) Patient has no active allergies.   History: Past Medical History:  Diagnosis Date   Arthritis    Bone spur    GERD (gastroesophageal reflux disease)    Muscle spasm    Past Surgical History:  Procedure Laterality Date   ABDOMINAL HYSTERECTOMY  1978   and oophorectomy   APPENDECTOMY  1945   BREAST BIOPSY Left early 80's   benign    BUNIONECTOMY     FOOT SURGERY Bilateral    OOPHORECTOMY     REPLACEMENT TOTAL KNEE Bilateral    TOTAL SHOULDER ARTHROPLASTY Left 06/06/2020   Procedure: TOTAL SHOULDER ARTHROPLASTY-[place];  Surgeon: Leora Lynwood SAUNDERS, MD;  Location: [facility] ORS;  Service: Orthopedics;  Laterality: Left;   Family History  Problem Relation Age of Onset   Breast cancer Mother    Diabetes Mother    Prostate cancer Father    Heart attack Father    Social History   Socioeconomic History   Marital status: Married    Spouse name: Deward   Number of children: 1   Years of education: 12   Highest education level: High school graduate  Occupational History    Employer: [employer]  Tobacco Use   Smoking status: Former    Current packs/day: 0.00    Average  packs/day: 1 pack/day for 5.0 years (5.0 ttl pk-yrs)    Types: Cigarettes    Start date: 12/23/1968    Quit date: 12/23/1973    Years since quitting: 50.1   Smokeless tobacco: Never  Vaping Use   Vaping status: Never Used  Substance and Sexual Activity   Alcohol use: No   Drug use: No   Sexual activity: Not Currently  Other Topics Concern   Not on file  Social History Narrative   Not on file   Social Drivers of Health   Financial Resource Strain: Low Risk  (01/28/2024)   Overall Financial Resource Strain (CARDIA)    Difficulty of Paying Living Expenses: Not hard at all  Food Insecurity: No Food Insecurity (01/28/2024)   Hunger Vital Sign    Worried About Running Out of Food in the Last Year: Never true    Ran Out  of Food in the Last Year: Never true  Transportation Needs: No Transportation Needs (01/28/2024)   PRAPARE - Administrator, Civil Service (Medical): No    Lack of Transportation (Non-Medical): No  Physical Activity: Insufficiently Active (01/28/2024)   Exercise Vital Sign    Days of Exercise per Week: 7 days    Minutes of Exercise per Session: 20 min  Stress: No Stress Concern Present (01/28/2024)   [employer] of Occupational Health - Occupational Stress Questionnaire    Feeling of Stress : Not at all  Social Connections: Unknown (01/28/2024)   Social Connection and Isolation Panel [NHANES]    Frequency of Communication with Friends and Family: Twice a week    Frequency of Social Gatherings with Friends and Family: Not on file    Attends Religious Services: Never    Database Administrator or Organizations: Yes    Attends Banker Meetings: More than 4 times per year    Marital Status: Widowed    Tobacco Counseling Counseling given: Not Answered   Clinical Intake:  Pre-visit preparation completed: Yes  Pain : 0-10 Pain Score: 4  Pain Type: Chronic pain Pain Location: Ankle Pain Orientation: Right, Left Pain Descriptors / Indicators: Aching, Discomfort Pain Onset: In the past 7 days Pain Frequency: Intermittent Pain Relieving Factors: HYDROCODONE   Pain Relieving Factors: HYDROCODONE   BMI - recorded: 24.06 Nutritional Status: BMI of 19-24  Normal Nutritional Risks: None Diabetes: No  How often do you need to have someone help you when you read instructions, pamphlets, or other written materials from your doctor or pharmacy?: 1 - Never  Interpreter Needed?: No  Information entered by :: JHONNIE DAS, LPN   Activities of Daily Living    01/28/2024    9:40 AM  In your present state of health, do you have any difficulty performing the following activities:  Hearing? 1  Vision? 0  Difficulty concentrating or making decisions? 0  Walking or  climbing stairs? 1  Comment ANKLE PAIN  Dressing or bathing? 0  Doing errands, shopping? 0  Preparing Food and eating ? N  Using the Toilet? N  In the past six months, have you accidently leaked urine? N  Do you have problems with loss of bowel control? N  Managing your Medications? N  Managing your Finances? N  Housekeeping or managing your Housekeeping? N    Patient Care Team: Gasper Nancyann BRAVO, MD as PCP - General (Family Medicine) Avanell Katz, MD as Referring Physician (Physical Medicine and Rehabilitation) Cherilynn Bruckner, MD as Referring Physician (Orthopedic Surgery) Pa, [facility]  Indicate any recent Medical Services you may have received from other than [facility] providers in the past year (date may be approximate).     Assessment:   This is a routine wellness examination for [place].  Hearing/Vision screen Hearing Screening - Comments:: WEARS AIDS, BOTH EARS Vision Screening - Comments:: WEARS GLASSES ALL THE TIME- [name]   Goals Addressed             This Visit's Progress    DIET - EAT MORE FRUITS AND VEGETABLES         Depression Screen    01/28/2024    9:35 AM 11/13/2023   10:43 AM 08/02/2023   10:58 AM 12/31/2022    8:53 AM 08/20/2022    3:29 PM 05/03/2022    9:20 AM 12/05/2021   10:11 AM  PHQ 2/9 Scores  PHQ - 2 Score 0 0 0 1 0 0 1  PHQ- 9 Score 0 0 2 2   3     Fall Risk    01/28/2024    9:40 AM 11/13/2023   10:43 AM 08/02/2023   10:58 AM 12/31/2022    8:58 AM 08/20/2022    3:28 PM  Fall Risk   Falls in the past year? 0 0 0 0 0  Number falls in past yr: 0 0 0 0 0  Injury with Fall? 0 0 0 0 0  Risk for fall due to : No Fall Risks  No Fall Risks No Fall Risks No Fall Risks  Follow up Falls prevention discussed;Falls evaluation completed  Falls evaluation completed Falls prevention discussed;Falls evaluation completed     MEDICARE RISK AT HOME: Medicare Risk at Home Any stairs in or around the home?: Yes If so, are there any  without handrails?: No Home free of loose throw rugs in walkways, pet beds, electrical cords, etc?: Yes Adequate lighting in your home to reduce risk of falls?: Yes Life alert?: No Use of a cane, walker or w/c?: No Grab bars in the bathroom?: No Shower chair or bench in shower?: No Elevated toilet seat or a handicapped toilet?: No  TIMED UP AND GO:  Was the test performed?  Yes  Length of time to ambulate 10 feet: 4 sec Gait steady and fast without use of assistive device    Cognitive Function:    08/02/2023   11:01 AM  MMSE - Mini Mental State Exam  Orientation to time 5  Orientation to Place 5  Registration 3  Attention/ Calculation 5  Recall 2  Language- name 2 objects 2  Language- repeat 1  Language- follow 3 step command 3  Language- read & follow direction 1  Write a sentence 1  Copy design 0  Total score 28        01/28/2024    9:42 AM 12/31/2022    9:02 AM  6CIT Screen  What Year? 0 points 0 points  What month? 0 points 0 points  What time? 0 points 0 points  Count back from 20 0 points 0 points  Months in reverse 0 points 0 points  Repeat phrase 0 points 4 points  Total Score 0 points 4 points    Immunizations Immunization History  Administered Date(s) Administered   Fluad Quad(high Dose 65+) 08/18/2019, 10/12/2021   Influenza Split 10/19/2006, 10/25/2007, 09/18/2008, 09/08/2009, 09/30/2010, 09/12/2011   Influenza, High Dose Seasonal PF 10/05/2014, 09/22/2015, 10/02/2016, 09/21/2022   Influenza-Unspecified 10/01/2017, 09/22/2018, 09/23/2020, 10/04/2023   PFIZER(Purple Top)SARS-COV-2 Vaccination 01/22/2020, 02/12/2020   Pneumococcal Conjugate-13  03/12/2014   Pneumococcal Polysaccharide-23 10/25/2004, 03/18/2017   Respiratory Syncytial Virus Vaccine,Recomb Aduvanted(Arexvy) 12/25/2022   Tdap 10/24/2004, 09/12/2011   Zoster, Live 09/18/2008    TDAP status: Due, Education has been provided regarding the importance of this vaccine. Advised may receive this  vaccine at local pharmacy or Health Dept. Aware to provide a copy of the vaccination record if obtained from local pharmacy or Health Dept. Verbalized acceptance and understanding.  Flu Vaccine status: Up to date  Pneumococcal vaccine status: Declined,  Education has been provided regarding the importance of this vaccine but patient still declined. Advised may receive this vaccine at local pharmacy or Health Dept. Aware to provide a copy of the vaccination record if obtained from local pharmacy or Health Dept. Verbalized acceptance and understanding.   Covid-19 vaccine status: Completed vaccines  Qualifies for Shingles Vaccine? Yes   Zostavax completed Yes   Shingrix Completed?: No.    Education has been provided regarding the importance of this vaccine. Patient has been advised to call insurance company to determine out of pocket expense if they have not yet received this vaccine. Advised may also receive vaccine at local pharmacy or Health Dept. Verbalized acceptance and understanding.  Screening Tests Health Maintenance  Topic Date Due   Zoster Vaccines- Shingrix (1 of 2) 12/16/1988   DEXA SCAN  03/25/2019   DTaP/Tdap/Td (3 - Td or Tdap) 09/11/2021   COVID-19 Vaccine (3 - 2024-25 season) 08/25/2023   Medicare Annual Wellness (AWV)  01/27/2025   Pneumonia Vaccine 72+ Years old  Completed   INFLUENZA VACCINE  Completed   HPV VACCINES  Aged Out    Health Maintenance  Health Maintenance Due  Topic Date Due   Zoster Vaccines- Shingrix (1 of 2) 12/16/1988   DEXA SCAN  03/25/2019   DTaP/Tdap/Td (3 - Td or Tdap) 09/11/2021   COVID-19 Vaccine (3 - 2024-25 season) 08/25/2023    Colorectal cancer screening: No longer required.   Mammogram status: No longer required due to AGE.  Bone Density status: Ordered 11/13/23. Pt provided with contact info and advised to call to schedule appt.  Lung Cancer Screening: (Low Dose CT Chest recommended if Age 25-80 years, 20 pack-year currently  smoking OR have quit w/in 15years.) does not qualify.    Additional Screening:  Hepatitis C Screening: does not qualify; Completed NO  Vision Screening: Recommended annual ophthalmology exams for early detection of glaucoma and other disorders of the eye. Is the patient up to date with their annual eye exam?  Yes  Who is the provider or what is the name of the office in which the patient attends annual eye exams? [name] If pt is not established with a provider, would they like to be referred to a provider to establish care? No .   Dental Screening: Recommended annual dental exams for proper oral hygiene   Community Resource Referral / Chronic Care Management: CRR required this visit?  No   CCM required this visit?  No     Plan:     I have personally reviewed and noted the following in the patient's chart:   Medical and social history Use of alcohol, tobacco or illicit drugs  Current medications and supplements including opioid prescriptions. Patient is currently taking opioid prescriptions. Information provided to patient regarding non-opioid alternatives. Patient advised to discuss non-opioid treatment plan with their provider. Functional ability and status Nutritional status Physical activity Advanced directives List of other physicians Hospitalizations, surgeries, and ER visits in previous 12 months  Vitals Screenings to include cognitive, depression, and falls Referrals and appointments  In addition, I have reviewed and discussed with patient certain preventive protocols, quality metrics, and best practice recommendations. A written personalized care plan for preventive services as well as general preventive health recommendations were provided to patient.     Jhonnie GORMAN Das, LPN   06/27/7973   After Visit Summary: (In Person-Declined) Patient declined AVS at this time.  Nurse Notes: NONE

## 2024-01-28 NOTE — Patient Instructions (Addendum)
 Jacqueline Daniels , Thank you for taking time to come for your Medicare Wellness Visit. I appreciate your ongoing commitment to your health goals. Please review the following plan we discussed and let me know if I can assist you in the future.   Referrals/Orders/Follow-Ups/Clinician Recommendations: NONE  This is a list of the screening recommended for you and due dates:  Health Maintenance  Topic Date Due   Zoster (Shingles) Vaccine (1 of 2) 12/16/1988   DEXA scan (bone density measurement)  03/25/2019   DTaP/Tdap/Td vaccine (3 - Td or Tdap) 09/11/2021   COVID-19 Vaccine (3 - 2024-25 season) 08/25/2023   Medicare Annual Wellness Visit  01/27/2025   Pneumonia Vaccine  Completed   Flu Shot  Completed   HPV Vaccine  Aged Out    Advanced directives: (ACP Link)Information on Advanced Care Planning can be found at [place]  Secretary of [facility] Advance Health Care Directives Advance Health Care Directives ([email or web address]   Next Medicare Annual Wellness Visit scheduled for next year: Yes   02/02/25 @ 9:30 IN PERSON

## 2024-01-30 ENCOUNTER — Other Ambulatory Visit: Payer: Self-pay | Admitting: Family Medicine

## 2024-01-30 DIAGNOSIS — M48062 Spinal stenosis, lumbar region with neurogenic claudication: Secondary | ICD-10-CM

## 2024-02-21 ENCOUNTER — Other Ambulatory Visit: Payer: Self-pay | Admitting: Family Medicine

## 2024-04-06 ENCOUNTER — Other Ambulatory Visit: Payer: Self-pay | Admitting: Family Medicine

## 2024-04-30 ENCOUNTER — Ambulatory Visit
Admission: RE | Admit: 2024-04-30 | Discharge: 2024-04-30 | Disposition: A | Discharge status: Home / Self Care | Source: Ambulatory Visit | Attending: Family Medicine | Admitting: Family Medicine

## 2024-04-30 ENCOUNTER — Ambulatory Visit: Admitting: Family Medicine

## 2024-04-30 ENCOUNTER — Encounter: Payer: Self-pay | Admitting: Family Medicine

## 2024-04-30 ENCOUNTER — Ambulatory Visit
Admission: RE | Admit: 2024-04-30 | Discharge: 2024-04-30 | Disposition: A | Discharge status: Home / Self Care | Attending: Family Medicine | Admitting: Family Medicine

## 2024-04-30 VITALS — BP 139/76 | HR 94 | Ht 63.0 in | Wt 134.1 lb

## 2024-04-30 DIAGNOSIS — M25472 Effusion, left ankle: Secondary | ICD-10-CM

## 2024-04-30 NOTE — Patient Instructions (Signed)
 Please report to [facility] located at:  [address]  [place], [place] [address]  You do not need an appointment to have xrays completed.   Our office will follow up with  results once available.

## 2024-04-30 NOTE — Progress Notes (Signed)
 ACUTE PATIENT VISIT    Patient: Jacqueline Daniels   DOB: 12-02-1936   88 y.o. Female  MRN: [id number] Visit Date: 04/30/2024  Today's healthcare provider: Mimi Alt, MD   PCP: Lamon Pillow, MD   Chief Complaint  Patient presents with   Ankle Pain    Onset a little over a month, left ankle pain in top of joint, 9/10 pain that comes on as walking but none while sitting, pt noticed some swelling starting yesterday, no known injury  HX of arthritis     Subjective     HPI     Ankle Pain    Additional comments: Onset a little over a month, left ankle pain in top of joint, 9/10 pain that comes on as walking but none while sitting, pt noticed some swelling starting yesterday, no known injury  HX of arthritis       Last edited by Gennaro Khat on 04/30/2024  3:01 PM.       Discussed the use of AI scribe software for clinical note transcription with the patient, who gave verbal consent to proceed.  History of Present Illness          Discussed the use of AI scribe software for clinical note transcription with the patient, who gave verbal consent to proceed.  History of Present Illness        Past Medical History:  Diagnosis Date   Arthritis    Bone spur    GERD (gastroesophageal reflux disease)    Muscle spasm     Medications: Outpatient Medications Prior to Visit  Medication Sig   ALPRAZolam  (XANAX ) 0.25 MG tablet TAKE 1/2-1 TABLET BY MOUTH EVERY DAY AS NEEDED FOR ANXIETY (Patient taking differently: as needed for anxiety.)   gabapentin  (NEURONTIN ) 100 MG capsule TAKE 1 CAPSULE BY MOUTH AT BEDTIME   HYDROcodone -acetaminophen  (NORCO/VICODIN) 5-325 MG tablet Take 1 tablet by mouth every 6 (six) hours as needed for moderate pain (pain score 4-6).   Multiple Vitamins-Minerals (MULTIVITAMIN WITH MINERALS) tablet Take 1 tablet by mouth daily.   baclofen  (LIORESAL ) 10 MG tablet TAKE 1 TABLET BY MOUTH THREE TIMES DAILYAS NEEDED FOR MUSCLE SPASMS  (Patient not taking: Reported on 04/30/2024)   No facility-administered medications prior to visit.    Review of Systems  Last metabolic panel Lab Results  Component Value Date   GLUCOSE 91 08/20/2022   NA 139 08/20/2022   K 4.1 08/20/2022   CL 101 08/20/2022   CO2 23 08/20/2022   BUN 17 08/20/2022   CREATININE 0.78 08/20/2022   EGFR 75 08/20/2022   CALCIUM 9.9 08/20/2022   PROT 6.7 10/12/2021   ALBUMIN 4.3 10/12/2021   LABGLOB 2.4 10/12/2021   AGRATIO 1.8 10/12/2021   BILITOT 0.3 10/12/2021   ALKPHOS 84 10/12/2021   AST 21 10/12/2021   ALT 12 10/12/2021   ANIONGAP 8 06/02/2020        Objective    BP 139/76 (BP Location: Left Arm, Patient Position: Sitting, Cuff Size: Normal)   Pulse 94   Ht 5\' 3"  (1.6 m)   Wt 134 lb 1.6 oz (60.8 kg)   SpO2 97%   BMI 23.75 kg/m  BP Readings from Last 3 Encounters:  04/30/24 139/76  01/28/24 (!) 160/80  11/13/23 (!) 167/103   Wt Readings from Last 3 Encounters:  04/30/24 134 lb 1.6 oz (60.8 kg)  01/28/24 135 lb 12.8 oz (61.6 kg)  11/13/23 135 lb (61.2 kg)  Physical Exam  Physical Exam CARDIOVASCULAR: Pulses intact. EXTREMITIES: Swelling of the medial left ankle. No erythema of the left ankle. Tenderness on palpation of the left ankle deformity. MUSCULOSKELETAL: Normal range of motion of the left ankle.    No results found for any visits on 04/30/24.  Assessment & Plan     Problem List Items Addressed This Visit   None Visit Diagnoses       Left ankle swelling    -  Primary   Relevant Orders   DG Ankle Complete Left (Completed)        Assessment & Plan Left ankle swelling and pain Chronic left ankle pain with recent onset of swelling, rated 9 out of 10 in severity. No recent injury reported. Tenderness on palpation of the medial left ankle with normal range of motion. Differential diagnosis includes possible hairline fracture, tendon tear, or ganglion cyst. Osteoporosis increases the risk of fractures,  potentially leading to an inflammatory process and swelling. - Order x-ray of left foot and ankle at outpatient imaging center to rule out fracture and assess for abnormalities. - Recommend ibuprofen 200-400 mg every 4-6 hours for pain management. - Advise ice therapy for swelling. - Refer to podiatry if x-ray findings are abnormal. - Instruct to follow up with PCP if symptoms do not improve.  Osteoporosis Osteoporosis increases risk for fractures even with minor trauma, potentially contributing to inflammatory processes if a fracture is present.       Return if symptoms worsen or fail to improve.         Mimi Alt, MD  [facility] [phone] (phone) [phone] (fax)  [facility]

## 2024-05-02 ENCOUNTER — Other Ambulatory Visit: Payer: Self-pay | Admitting: Family Medicine

## 2024-05-04 ENCOUNTER — Other Ambulatory Visit: Payer: Self-pay | Admitting: Family Medicine

## 2024-05-04 NOTE — Telephone Encounter (Signed)
 Copied from CRM [id number]. Topic: Clinical - Medication Refill >> May 04, 2024  9:00 AM Lizabeth Riggs wrote: Medication: HYDROcodone -acetaminophen  (NORCO/VICODIN) 5-325 MG tablet   Has the patient contacted their pharmacy? Yes (Agent: If no, request that the patient contact the pharmacy for the refill. If patient does not wish to contact the pharmacy document the reason why and proceed with request.) (Agent: If yes, when and what did the pharmacy advise?) Pharmacy needs order to refill  This is the patient's preferred pharmacy:  [facility] - [place], [place] - [address] [place] [address] Phone: [phone] Fax: [phone]  Is this the correct pharmacy for this prescription? Yes If no, delete pharmacy and type the correct one.   Has the prescription been filled recently? No  Is the patient out of the medication? Yes She took last pill today.   Has the patient been seen for an appointment in the last year OR does the patient have an upcoming appointment? Yes  Can we respond through MyChart? No  Agent: Please be advised that Rx refills may take up to 3 business days. We ask that you follow-up with your pharmacy.

## 2024-05-05 NOTE — Telephone Encounter (Signed)
 Requested medications are due for refill today.  no  Requested medications are on the active medications list.  yes  Last refill. 05/03/2024 #30 0 rf   Future visit scheduled.   no  Notes to clinic.  Refusal not delegated.    Requested Prescriptions  Pending Prescriptions Disp Refills   HYDROcodone -acetaminophen  (NORCO/VICODIN) 5-325 MG tablet 30 tablet 0    Sig: Take 1 tablet by mouth every 6 (six) hours as needed for moderate pain (pain score 4-6).     Not Delegated - Analgesics:  Opioid Agonist Combinations Failed - 05/05/2024  4:01 PM      Failed - This refill cannot be delegated      Failed - Urine Drug Screen completed in last 360 days      Failed - Valid encounter within last 3 months    Recent Outpatient Visits           5 days ago Left ankle swelling   [place] [facility] Mimi Alt, MD

## 2024-05-18 ENCOUNTER — Encounter: Payer: Self-pay | Admitting: Emergency Medicine

## 2024-05-18 ENCOUNTER — Ambulatory Visit
Admission: EM | Admit: 2024-05-18 | Discharge: 2024-05-18 | Disposition: A | Discharge status: Home / Self Care | Attending: Emergency Medicine | Admitting: Emergency Medicine

## 2024-05-18 ENCOUNTER — Other Ambulatory Visit: Payer: Self-pay

## 2024-05-18 ENCOUNTER — Emergency Department
Admission: EM | Admit: 2024-05-18 | Discharge: 2024-05-18 | Disposition: A | Discharge status: Home / Self Care | Attending: Emergency Medicine | Admitting: Emergency Medicine

## 2024-05-18 ENCOUNTER — Emergency Department

## 2024-05-18 DIAGNOSIS — R030 Elevated blood-pressure reading, without diagnosis of hypertension: Secondary | ICD-10-CM | POA: Diagnosis not present

## 2024-05-18 DIAGNOSIS — R2689 Other abnormalities of gait and mobility: Secondary | ICD-10-CM | POA: Diagnosis not present

## 2024-05-18 DIAGNOSIS — R42 Dizziness and giddiness: Secondary | ICD-10-CM | POA: Insufficient documentation

## 2024-05-18 DIAGNOSIS — I160 Hypertensive urgency: Secondary | ICD-10-CM | POA: Diagnosis present

## 2024-05-18 LAB — COMPREHENSIVE METABOLIC PANEL WITH GFR
ALT: 20 U/L (ref 0–44)
AST: 29 U/L (ref 15–41)
Albumin: 4.6 g/dL (ref 3.5–5.0)
Alkaline Phosphatase: 70 U/L (ref 38–126)
Anion gap: 10 (ref 5–15)
BUN: 19 mg/dL (ref 8–23)
CO2: 27 mmol/L (ref 22–32)
Calcium: 8.5 mg/dL — ABNORMAL LOW (ref 8.9–10.3)
Chloride: 103 mmol/L (ref 98–111)
Creatinine, Ser: 0.86 mg/dL (ref 0.44–1.00)
GFR, Estimated: >60 mL/min (ref >60)
Glucose, Bld: 107 mg/dL — ABNORMAL HIGH (ref 70–99)
Potassium: 3.7 mmol/L (ref 3.5–5.1)
Sodium: 140 mmol/L (ref 135–145)
Total Bilirubin: 0.7 mg/dL (ref 0.0–1.2)
Total Protein: 7.6 g/dL (ref 6.5–8.1)

## 2024-05-18 LAB — CBC
HCT: 42.5 % (ref 36.0–46.0)
Hemoglobin: 14.1 g/dL (ref 12.0–15.0)
MCH: 31.2 pg (ref 26.0–34.0)
MCHC: 33.2 g/dL (ref 30.0–36.0)
MCV: 94 fL (ref 80.0–100.0)
Platelets: 303 10*3/uL (ref 150–400)
RBC: 4.52 MIL/uL (ref 3.87–5.11)
RDW: 12.3 % (ref 11.5–15.5)
WBC: 7.5 10*3/uL (ref 4.0–10.5)
nRBC: 0 % (ref 0.0–0.2)

## 2024-05-18 MED ORDER — LORAZEPAM 1 MG PO TABS
1.0000 mg | ORAL_TABLET | ORAL | Status: DC | PRN
  Administered 2024-05-18: 1 mg via ORAL
  Filled 2024-05-18: qty 1

## 2024-05-18 NOTE — ED Notes (Signed)
 Pt given sips of water to take medication prior to MRI. Pt had no issues drinking water.

## 2024-05-18 NOTE — ED Notes (Signed)
 Patient escorted to car by Jolan Natal, rad tech

## 2024-05-18 NOTE — ED Notes (Signed)
 Pt's son called and advised that the pt is ready to be discharged.

## 2024-05-18 NOTE — ED Triage Notes (Signed)
 Pt c/o dizziness since yesterday, says she went to urgent care today and was sent to be checked out due to HTN. Pt AOX4, NAD noted. Pt denies CP, SHOB, weakness, reports some dizziness. Pt states her ears are "stuffed up," and she thought that was the cause of her dizziness initially.

## 2024-05-18 NOTE — ED Provider Notes (Signed)
 [facility] Provider Note    Event Date/Time   First MD Initiated Contact with Patient 05/18/24 1319     (approximate)   History   Chief Complaint Hypertension and Dizziness   HPI  Jacqueline Daniels is a 88 y.o. female with past medical history of hyperlipidemia and migraines who presents to the ED complaining of dizziness.  Patient reports that for the past 2 days she has been feeling dizzy and unsteady on her feet.  She states that she does not feel unwell when sitting still, but then feels like she has to hold onto something when attempting to walk.  She denies any lightheadedness or feeling like she is going to pass out, has not had any chest pain or shortness of breath.  She initially presented to the [facility] walk-in clinic, was found to be hypertensive and referred to the ED for further evaluation.  She does not take any medication for blood pressure, denies any history of hypertension.  She has not had any vision changes, speech changes, numbness, weakness.     Physical Exam   Triage Vital Signs: ED Triage Vitals  Encounter Vitals Group     BP 05/18/24 1315 (!) 176/88     Systolic BP Percentile --      Diastolic BP Percentile --      Pulse Rate 05/18/24 1315 71     Resp 05/18/24 1315 17     Temp 05/18/24 1315 97.6 F (36.4 C)     Temp Source 05/18/24 1315 Oral     SpO2 05/18/24 1315 100 %     Weight --      Height --      Head Circumference --      Peak Flow --      Pain Score 05/18/24 1316 0     Pain Loc --      Pain Education --      Exclude from Growth Chart --     Most recent vital signs: Vitals:   05/18/24 1315 05/18/24 1325  BP: (!) 176/88 (!) 186/105  Pulse: 71 82  Resp: 17 18  Temp: 97.6 F (36.4 C)   SpO2: 100% 100%    Constitutional: Alert and oriented. Eyes: Conjunctivae are normal. Head: Atraumatic. Nose: No congestion/rhinnorhea. Mouth/Throat: Mucous membranes are moist.  Cardiovascular: Normal rate, regular  rhythm. Grossly normal heart sounds.  2+ radial pulses bilaterally. Respiratory: Normal respiratory effort.  No retractions. Lungs CTAB. Gastrointestinal: Soft and nontender. No distention. Musculoskeletal: No lower extremity tenderness nor edema.  Neurologic:  Normal speech and language. No gross focal neurologic deficits are appreciated.    ED Results / Procedures / Treatments   Labs (all labs ordered are listed, but only abnormal results are displayed) Labs Reviewed  COMPREHENSIVE METABOLIC PANEL WITH GFR - Abnormal; Notable for the following components:      Result Value   Glucose, Bld 107 (*)    Calcium 8.5 (*)    All other components within normal limits  CBC     EKG  ED ECG REPORT I, Twilla Galea, the attending physician, personally viewed and interpreted this ECG.   Date: 05/18/2024  EKG Time: 13:18  Rate: 74  Rhythm: normal sinus rhythm  Axis: Normal  Intervals:none  ST&T Change: None  PROCEDURES:  Critical Care performed: No  Procedures   MEDICATIONS ORDERED IN ED: Medications  LORazepam  (ATIVAN ) tablet 1 mg (1 mg Oral Given 05/18/24 1342)     IMPRESSION / MDM /  ASSESSMENT AND PLAN / ED COURSE  I reviewed the triage vital signs and the nursing notes.                              88 y.o. female with past medical history of hyperlipidemia and migraines who presents to the ED complaining of 2 days of dizziness and unsteady gait, found to be hypertensive at walk-in clinic.  Patient's presentation is most consistent with acute presentation with potential threat to life or bodily function.  Differential diagnosis includes, but is not limited to, stroke, TIA, peripheral vertigo, anemia, electrolyte abnormality, AKI, arrhythmia.  Patient nontoxic-appearing and in no acute distress, vital signs remarkable for hypertension but otherwise reassuring.  EKG shows no evidence of arrhythmia or ischemia and she has no focal neurologic deficits on exam.  Her  description of dizziness along with hypertension is concerning for acute stroke, will further assess with MRI.  Labs are pending at this time.  Labs without significant anemia, leukocytosis, electrolyte abnormality, or AKI.  Patient turned over to oncoming provider pending MRI results.      FINAL CLINICAL IMPRESSION(S) / ED DIAGNOSES   Final diagnoses:  Dizziness     Rx / DC Orders   ED Discharge Orders     None        Note:  This document was prepared using Dragon voice recognition software and may include unintentional dictation errors.   Twilla Galea, MD 05/18/24 [phone]

## 2024-05-18 NOTE — ED Notes (Signed)
 Pt is CAOx4, breathing normally, and normal in color. Pt is sitting upright in the bed and does not appear to be in any distress at this time. Pt states that she was seen at the UC today for dizziness and was sent here due to high blood pressure. Pt states that she has been dealing with the dizziness for 2 years now and has never been diagnosed with anything. Pt able to ambulate without issue, however pt was made a high fall risk due to her dizziness and possibility of affecting her balance and coordination.

## 2024-05-18 NOTE — ED Provider Notes (Signed)
 Jacqueline Daniels    CSN: [id number] Arrival date & time: 05/18/24  1106      History   Chief Complaint Chief Complaint  Patient presents with   Dizziness    HPI Jacqueline Daniels is a 88 y.o. female.  Patient presents with dizziness since she woke up yesterday morning.  Initially she had the sensation of the room spinning while she was lying down.  Currently she feels slightly off balance with some mild nausea and her ears feel "stuffy."  No focal weakness, chest pain, shortness of breath, vision change, vomiting.  She reports similar symptoms in the past with episodes of vertigo; last occurred >1 year ago.  The history is provided by the patient and medical records.    Past Medical History:  Diagnosis Date   Arthritis    Bone spur    GERD (gastroesophageal reflux disease)    Muscle spasm     Patient Active Problem List   Diagnosis Date Noted   Fatigue 08/22/2022   Lumbar radiculopathy 07/17/2021   Status post total shoulder replacement, left 06/06/2020   Family history of breast cancer in mother 03/18/2017   Situational anxiety 02/01/2016   Atypical migraine 02/01/2016   White coat syndrome without diagnosis of hypertension 02/01/2016   Hemorrhoid 02/01/2016   Intestinal malabsorption 02/01/2016   Gastro-esophageal reflux disease without esophagitis 02/01/2016   Skin lesion 02/01/2016   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   Adaptive colitis 06/07/2014   Hypercholesteremia 09/08/2009   H/O: hysterectomy 07/21/2009   Lumbar canal stenosis 07/21/2009   Osteoarthritis 05/20/2009   OP (osteoporosis) 07/23/2003    Past Surgical History:  Procedure Laterality Date   ABDOMINAL HYSTERECTOMY  1978   and oophorectomy   APPENDECTOMY  1945   BREAST BIOPSY Left early 80's   benign    BUNIONECTOMY     FOOT SURGERY Bilateral    OOPHORECTOMY     REPLACEMENT TOTAL KNEE Bilateral    TOTAL SHOULDER ARTHROPLASTY Left 06/06/2020   Procedure: TOTAL SHOULDER  ARTHROPLASTY-[place];  Surgeon: Jerlyn Moons, MD;  Location: [facility] ORS;  Service: Orthopedics;  Laterality: Left;    OB History     Gravida  1   Para  1   Term      Preterm      AB      Living  1      SAB      IAB      Ectopic      Multiple      Live Births           Obstetric Comments  1st Menstrual Cycle: 15 Pregnancy:  25          Home Medications    Prior to Admission medications   Medication Sig Start Date End Date Taking? Authorizing Provider  ALPRAZolam  (XANAX ) 0.25 MG tablet TAKE 1/2-1 TABLET BY MOUTH EVERY DAY AS NEEDED FOR ANXIETY Patient taking differently: as needed for anxiety. 02/01/24   Lamon Pillow, MD  baclofen  (LIORESAL ) 10 MG tablet TAKE 1 TABLET BY MOUTH THREE TIMES DAILYAS NEEDED FOR MUSCLE SPASMS Patient not taking: Reported on 04/30/2024 01/07/24   Lamon Pillow, MD  gabapentin  (NEURONTIN ) 100 MG capsule TAKE 1 CAPSULE BY MOUTH AT BEDTIME 02/01/24   Lamon Pillow, MD  HYDROcodone -acetaminophen  (NORCO/VICODIN) 5-325 MG tablet Take 1 tablet by mouth every 6 (six) hours as needed for moderate pain (pain score 4-6). 05/03/24   Lamon Pillow, MD  Multiple Vitamins-Minerals (MULTIVITAMIN WITH  MINERALS) tablet Take 1 tablet by mouth daily.    [provider]    Family History Family History  Problem Relation Age of Onset   Breast cancer Mother    Diabetes Mother    Prostate cancer Father    Heart attack Father     Social History Social History   Tobacco Use   Smoking status: Former    Current packs/day: 0.00    Average packs/day: 1 pack/day for 5.0 years (5.0 ttl pk-yrs)    Types: Cigarettes    Start date: 12/23/1968    Quit date: 12/23/1973    Years since quitting: 50.4   Smokeless tobacco: Never  Vaping Use   Vaping status: Never Used  Substance Use Topics   Alcohol use: Yes    Comment: 1 strawberry cooler a month   Drug use: No     Allergies   Patient has no known allergies.   Review of  Systems Review of Systems  Constitutional:  Negative for chills and fever.  HENT:  Positive for ear pain. Negative for congestion, ear discharge, rhinorrhea and sore throat.   Respiratory:  Negative for cough and shortness of breath.   Cardiovascular:  Negative for chest pain and palpitations.  Neurological:  Positive for dizziness and light-headedness. Negative for syncope, facial asymmetry, speech difficulty, weakness, numbness and headaches.     Physical Exam Triage Vital Signs ED Triage Vitals  Encounter Vitals Group     BP 05/18/24 1159 (!) 185/77     Systolic BP Percentile --      Diastolic BP Percentile --      Pulse Rate 05/18/24 1150 90     Resp 05/18/24 1150 18     Temp 05/18/24 1150 97.7 F (36.5 C)     Temp src --      SpO2 05/18/24 1150 98 %     Weight --      Height --      Head Circumference --      Peak Flow --      Pain Score 05/18/24 1157 0     Pain Loc --      Pain Education --      Exclude from Growth Chart --    No data found.  Updated Vital Signs BP (!) 181/103   Pulse 90   Temp 97.7 F (36.5 C)   Resp 18   SpO2 98%   Visual Acuity Right Eye Distance:   Left Eye Distance:   Bilateral Distance:    Right Eye Near:   Left Eye Near:    Bilateral Near:     Physical Exam Constitutional:      General: She is not in acute distress. HENT:     Right Ear: Tympanic membrane and ear canal normal.     Left Ear: Tympanic membrane and ear canal normal.     Nose: Nose normal.     Mouth/Throat:     Mouth: Mucous membranes are moist.     Pharynx: Oropharynx is clear.  Cardiovascular:     Rate and Rhythm: Normal rate and regular rhythm.     Heart sounds: Normal heart sounds.  Pulmonary:     Effort: Pulmonary effort is normal. No respiratory distress.     Breath sounds: Normal breath sounds.  Neurological:     General: No focal deficit present.     Mental Status: She is alert and oriented to person, place, and time.     Sensory: No sensory  deficit.     Motor: No weakness.     Gait: Gait normal.  Psychiatric:        Mood and Affect: Mood normal.        Behavior: Behavior normal.      UC Treatments / Results  Labs (all labs ordered are listed, but only abnormal results are displayed) Labs Reviewed - No data to display  EKG   Radiology No results found.  Procedures Procedures (including critical care time)  Medications Ordered in UC Medications - No data to display  Initial Impression / Assessment and Plan / UC Course  I have reviewed the triage vital signs and the nursing notes.  Pertinent labs & imaging results that were available during my care of the patient were reviewed by me and considered in my medical decision making (see chart for details).    Dizziness, hypertensive urgency.  Blood pressure 185/77; repeated and 181/103.  Patient initially declined transfer to the ED or EKG.  She reports that her blood pressure is usually high when she is in a health care setting.  Discussed that her blood pressure is very high and could cause her to have a cardiovascular event such as a stroke.  Discussed that her dizziness may be related to her very high blood pressure.  Patient then agrees to go to the ED and to have an EKG done here.  EKG shows sinus rhythm, rate 86, no ST elevation, compared to previous from 06/02/2020.  She is accompanied by her son who will take her to [facility] ED.  They decline EMS.   Final Clinical Impressions(s) / UC Diagnoses   Final diagnoses:  Dizziness  Hypertensive urgency     Discharge Instructions      Go to the emergency department for you dizziness and very high blood pressure.    ED Prescriptions   None    PDMP not reviewed this encounter.   Wellington Half, NP 05/18/24 [phone]

## 2024-05-18 NOTE — ED Provider Notes (Signed)
.-----------------------------------------   3:07 PM on 05/18/2024 -----------------------------------------  Blood pressure (!) 161/92, pulse 73, temperature 97.6 F (36.4 C), temperature source Oral, resp. rate 18, SpO2 97%.  Assuming care from Dr. Cleora Daft.  In short, Jacqueline Daniels is a 88 y.o. female with a chief complaint of Hypertension and Dizziness .  Refer to the original H&P for additional details.  The current plan of care is to follow-up MRI, ambulation trial, likely discharge after.  On reassessment patient states that she is feeling lot better, was able to ambulate without any recurrent dizziness.  She has no tenderness to her left mastoid region, no overlying redness.  Considered but no indication for inpatient admission at this time, she safe for outpatient management.  Will discharge with strict return precautions, instructed her to follow-up with her primary care doctor for further management of her symptoms.  Shared decision making to patient and she is agreeable with this plan.  Will discharge.  Clinical Course as of 05/18/24 1610  Mon May 18, 2024  1541 MR BRAIN WO CONTRAST IMPRESSION: 1. No acute intracranial abnormality. 2. Mild atrophy and moderate periventricular white matter T2 hyperintensities, slightly progressed since the prior exam. This likely reflects the sequelae of chronic microvascular ischemia. 3. Left mastoid effusion.   [TT]    Clinical Course User Index [TT] Shane Darling, MD      Shane Darling, MD 05/18/24 [phone]

## 2024-05-18 NOTE — ED Notes (Signed)
 Pt son left and went home. He took pt's purse with him. He advised to call him with any results/news or when pt ready to be discharged.

## 2024-05-18 NOTE — ED Triage Notes (Signed)
 Vertigo started yesterday.  History of the same.  Denies any variation form prior episodes.    Took "OTC motion sickness pills"

## 2024-05-18 NOTE — Discharge Instructions (Addendum)
 Go to the emergency department for you dizziness and very high blood pressure.

## 2024-05-19 ENCOUNTER — Ambulatory Visit: Admitting: Family Medicine

## 2024-05-19 ENCOUNTER — Encounter: Payer: Self-pay | Admitting: Family Medicine

## 2024-05-19 VITALS — BP 147/77 | HR 75 | Temp 97.7°F | Resp 16 | Ht 63.0 in | Wt 134.0 lb

## 2024-05-19 DIAGNOSIS — I10 Essential (primary) hypertension: Secondary | ICD-10-CM

## 2024-05-19 DIAGNOSIS — R42 Dizziness and giddiness: Secondary | ICD-10-CM | POA: Diagnosis not present

## 2024-05-19 MED ORDER — MECLIZINE HCL 25 MG PO TABS: 25.0000 mg | ORAL_TABLET | Freq: Three times a day (TID) | ORAL | 0 refills | Status: DC | PRN

## 2024-05-19 MED ORDER — AMLODIPINE BESYLATE 2.5 MG PO TABS: 2.5000 mg | ORAL_TABLET | Freq: Every day | ORAL | 1 refills | Status: DC

## 2024-05-19 NOTE — Progress Notes (Signed)
 Established patient visit   Patient: Jacqueline Daniels   DOB: [birth year]   88 y.o. Female  MRN: [id number] Visit Date: 05/19/2024  Today's healthcare provider: Jeralene Mom, MD   Chief Complaint  Patient presents with   Follow-up    Patient was seen at the ED yesterday for Dizziness. MRI was done.   Subjective    Discussed the use of AI scribe software for clinical note transcription with the patient, who gave verbal consent to proceed.  History of Present Illness   Jacqueline Daniels is an 88 year old female who presents with dizziness and high blood pressure.  She has been experiencing significant dizziness that began early yesterday morning, described as feeling 'really drunk', with difficulty walking without support. She recalls waking up at 4 AM and seeing the clock numbers moving, which led to a fall against her dresser. She has been cautious when getting up to prevent further falls.  She visited the emergency department where her blood pressure was found to be very high. She has a history of intermittent high blood pressure and has taken medication for it in the past, though she has not been on a consistent regimen recently. She has not been prescribed any medication for her current episode of high blood pressure or dizziness prior to this visit.  She reports her ears feeling stuffy, which she initially thought was causing her dizziness. No ringing in the ears, sore throat, runny nose, or cold symptoms. No blurry or double vision.  She experiences occasional nausea but denies any current symptoms. She reports feeling tired and lacking energy over the past six months, which is unusual for her as she describes herself as a 'workaholic'. No shortness of breath.       Medications: Outpatient Medications Prior to Visit  Medication Sig   ALPRAZolam  (XANAX ) 0.25 MG tablet TAKE 1/2-1 TABLET BY MOUTH EVERY DAY AS NEEDED FOR ANXIETY (Patient taking differently: as needed for anxiety.)    gabapentin  (NEURONTIN ) 100 MG capsule TAKE 1 CAPSULE BY MOUTH AT BEDTIME   HYDROcodone -acetaminophen  (NORCO/VICODIN) 5-325 MG tablet Take 1 tablet by mouth every 6 (six) hours as needed for moderate pain (pain score 4-6).   Multiple Vitamins-Minerals (MULTIVITAMIN WITH MINERALS) tablet Take 1 tablet by mouth daily.   baclofen  (LIORESAL ) 10 MG tablet TAKE 1 TABLET BY MOUTH THREE TIMES DAILYAS NEEDED FOR MUSCLE SPASMS (Patient not taking: Reported on 05/19/2024)   No facility-administered medications prior to visit.        Objective    BP (!) 147/77 (BP Location: Left Arm, Patient Position: Sitting, Cuff Size: Normal)   Pulse 75   Temp 97.7 F (36.5 C) (Oral)   Resp 16   Ht 5\' 3"  (1.6 m)   Wt 134 lb (60.8 kg)   SpO2 96%   BMI 23.74 kg/m   Physical Exam   General: Appearance:    Well developed, well nourished female in no acute distress  EENT:  NP/OP pink and clear. Tms ear canals clear. Positive Dix-Hallpike test on left. Staggared walking gait.   Eyes:    PERRL, conjunctiva/corneas clear, EOM's intact       Lungs:     Clear to auscultation bilaterally, respirations unlabored  Heart:    Normal heart rate. Normal rhythm. No murmurs, rubs, or gallops.    MS:   All extremities are intact.    Neurologic:   Awake, alert, oriented x 3. No apparent focal neurological defect.  Assessment & Plan        Dizziness Acute dizziness with vertigo, likely vestibular dysfunction. No current nausea, no cold symptoms, no fever, no tinnitus. Positional changes worsen symptoms. - Prescribed meclizine 25mg  for dizziness, 2-3 times daily as needed. Informed about potential drowsiness. - Advised to avoid rapid position changes.  Hypertension Severe blood pressure elevation yesterday at ER. Unclear if dizziness is related to hypertension. Long-term antihypertensive therapy discussed due to age and persistent high readings. - Prescribed amlodipine 2.5mg  once daily  - Advised follow-up in  2-3 weeks for blood pressure and symptom reassessment. - Encouraged adequate fluid intake.       Jeralene Mom, MD  [facility] [phone] (phone) [phone] (fax)  [facility]

## 2024-05-19 NOTE — Patient Instructions (Signed)
 Marland Kitchen  Please review the attached list of medications and notify my office if there are any errors.   . Please bring all of your medications to every appointment so we can make sure that our medication list is the same as yours.

## 2024-05-20 ENCOUNTER — Emergency Department
Admission: EM | Admit: 2024-05-20 | Discharge: 2024-05-20 | Disposition: A | Discharge status: Home / Self Care | Attending: Emergency Medicine | Admitting: Emergency Medicine

## 2024-05-20 ENCOUNTER — Ambulatory Visit: Payer: Self-pay

## 2024-05-20 ENCOUNTER — Other Ambulatory Visit: Payer: Self-pay

## 2024-05-20 ENCOUNTER — Emergency Department

## 2024-05-20 DIAGNOSIS — R42 Dizziness and giddiness: Secondary | ICD-10-CM | POA: Diagnosis present

## 2024-05-20 DIAGNOSIS — H81399 Other peripheral vertigo, unspecified ear: Secondary | ICD-10-CM | POA: Diagnosis not present

## 2024-05-20 LAB — URINALYSIS, ROUTINE W REFLEX MICROSCOPIC
Bacteria, UA: NONE SEEN
Bilirubin Urine: NEGATIVE
Glucose, UA: NEGATIVE mg/dL
Ketones, ur: NEGATIVE mg/dL
Leukocytes,Ua: NEGATIVE
Nitrite: NEGATIVE
Protein, ur: NEGATIVE mg/dL
Specific Gravity, Urine: 1.003 — ABNORMAL LOW (ref 1.005–1.030)
Squamous Epithelial / HPF: 0 /HPF (ref 0–5)
pH: 7 (ref 5.0–8.0)

## 2024-05-20 LAB — CBC
HCT: 38.4 % (ref 36.0–46.0)
Hemoglobin: 12.6 g/dL (ref 12.0–15.0)
MCH: 31.3 pg (ref 26.0–34.0)
MCHC: 32.8 g/dL (ref 30.0–36.0)
MCV: 95.3 fL (ref 80.0–100.0)
Platelets: 278 10*3/uL (ref 150–400)
RBC: 4.03 MIL/uL (ref 3.87–5.11)
RDW: 12.3 % (ref 11.5–15.5)
WBC: 8.3 10*3/uL (ref 4.0–10.5)
nRBC: 0 % (ref 0.0–0.2)

## 2024-05-20 LAB — COMPREHENSIVE METABOLIC PANEL WITH GFR
ALT: 17 U/L (ref 0–44)
AST: 31 U/L (ref 15–41)
Albumin: 3.9 g/dL (ref 3.5–5.0)
Alkaline Phosphatase: 80 U/L (ref 38–126)
Anion gap: 9 (ref 5–15)
BUN: 21 mg/dL (ref 8–23)
CO2: 24 mmol/L (ref 22–32)
Calcium: 9.3 mg/dL (ref 8.9–10.3)
Chloride: 106 mmol/L (ref 98–111)
Creatinine, Ser: 0.8 mg/dL (ref 0.44–1.00)
GFR, Estimated: >60 mL/min (ref >60)
Glucose, Bld: 110 mg/dL — ABNORMAL HIGH (ref 70–99)
Potassium: 3.9 mmol/L (ref 3.5–5.1)
Sodium: 139 mmol/L (ref 135–145)
Total Bilirubin: 0.5 mg/dL (ref 0.0–1.2)
Total Protein: 7 g/dL (ref 6.5–8.1)

## 2024-05-20 LAB — TROPONIN I (HIGH SENSITIVITY): Troponin I (High Sensitivity): 6 ng/L (ref <18)

## 2024-05-20 MED ORDER — MECLIZINE HCL 25 MG PO TABS
25.0000 mg | ORAL_TABLET | Freq: Once | ORAL | Status: AC
  Administered 2024-05-20: 25 mg via ORAL
  Filled 2024-05-20: qty 1

## 2024-05-20 NOTE — ED Provider Notes (Signed)
 [facility] Provider Note    Event Date/Time   First MD Initiated Contact with Patient 05/20/24 1817     (approximate)   History   Chief Complaint Hypertension and Dizziness   HPI  Jacqueline Daniels is a 88 y.o. female with past medical history of hyperlipidemia and migraines who presents to the ED complaining of dizziness.  Patient reports that for the past few days she has been feeling dizzy when she goes to stand.  She describes it as an unsteadiness on her feet with the room spinning around her, has to support herself to walk.  She denies any associated vision changes, speech changes, numbness, or weakness.  She has not had any chest pain or shortness of breath.  She was seen in the ED for similar symptoms 2 days ago, MRI at that time was negative for acute process.  She was prescribed meclizine by her PCP, but has not been taking this.     Physical Exam   Triage Vital Signs: ED Triage Vitals  Encounter Vitals Group     BP 05/20/24 1648 (!) 173/91     Systolic BP Percentile --      Diastolic BP Percentile --      Pulse Rate 05/20/24 1648 93     Resp 05/20/24 1648 17     Temp 05/20/24 1648 98 F (36.7 C)     Temp src --      SpO2 05/20/24 1648 98 %     Weight --      Height --      Head Circumference --      Peak Flow --      Pain Score 05/20/24 1646 0     Pain Loc --      Pain Education --      Exclude from Growth Chart --     Most recent vital signs: Vitals:   05/20/24 1648 05/20/24 1952  BP: (!) 173/91   Pulse: 93 82  Resp: 17 (!) 9  Temp: 98 F (36.7 C)   SpO2: 98% 97%    Constitutional: Alert and oriented. Eyes: Conjunctivae are normal. Head: Atraumatic. Nose: No congestion/rhinnorhea. Mouth/Throat: Mucous membranes are moist.  Cardiovascular: Normal rate, regular rhythm. Grossly normal heart sounds.  2+ radial pulses bilaterally. Respiratory: Normal respiratory effort.  No retractions. Lungs CTAB. Gastrointestinal: Soft and  nontender. No distention. Musculoskeletal: No lower extremity tenderness nor edema.  Neurologic:  Normal speech and language. No gross focal neurologic deficits are appreciated.    ED Results / Procedures / Treatments   Labs (all labs ordered are listed, but only abnormal results are displayed) Labs Reviewed  COMPREHENSIVE METABOLIC PANEL WITH GFR - Abnormal; Notable for the following components:      Result Value   Glucose, Bld 110 (*)    All other components within normal limits  URINALYSIS, ROUTINE W REFLEX MICROSCOPIC - Abnormal; Notable for the following components:   Color, Urine COLORLESS (*)    APPearance CLEAR (*)    Specific Gravity, Urine 1.003 (*)    Hgb urine dipstick SMALL (*)    All other components within normal limits  CBC  TROPONIN I (HIGH SENSITIVITY)     EKG  ED ECG REPORT I, Twilla Galea, the attending physician, personally viewed and interpreted this ECG.   Date: 05/20/2024  EKG Time: 16:48  Rate: 91  Rhythm: normal sinus rhythm  Axis: Normal  Intervals:none  ST&T Change: Nonspecific ST abnormality  RADIOLOGY  CT head reviewed and interpreted by me with no hemorrhage or midline shift.  PROCEDURES:  Critical Care performed: No  Procedures   MEDICATIONS ORDERED IN ED: Medications  meclizine (ANTIVERT) tablet 25 mg (25 mg Oral Given 05/20/24 1830)     IMPRESSION / MDM / ASSESSMENT AND PLAN / ED COURSE  I reviewed the triage vital signs and the nursing notes.                              88 y.o. female with past medical history of hyperlipidemia and migraines who presents to the ED complaining of dizziness and unsteadiness on her feet.  Patient's presentation is most consistent with acute presentation with potential threat to life or bodily function.  Differential diagnosis includes, but is not limited to, stroke, TIA, arrhythmia, ACS, anemia, electrolyte abnormality, AKI, peripheral vertigo.  Patient nontoxic-appearing and in no  acute distress, vital signs remarkable for hypertension but otherwise reassuring.  I reviewed her ED visit from 2 days ago, when MRI brain was negative for acute process.  Given this is a continuation of the same symptoms, I doubt stroke.  CT head is negative for acute process, labs without significant anemia, leukocytosis, electrolyte abnormality, or AKI.  We will treat with dose of meclizine and reassess.  Troponin within normal limits and I doubt cardiac etiology for his symptoms.  Dizziness improved following dose of meclizine and patient appropriate for discharge home with outpatient follow-up.  She was previously prescribed meclizine by her PCP, referral provided to ENT.  Patient and son counseled to return to the ED for new or worsening symptoms, patient agrees with plan.      FINAL CLINICAL IMPRESSION(S) / ED DIAGNOSES   Final diagnoses:  Dizziness  Peripheral vertigo, unspecified laterality     Rx / DC Orders   ED Discharge Orders     None        Note:  This document was prepared using Dragon voice recognition software and may include unintentional dictation errors.   Twilla Galea, MD 05/20/24 2114

## 2024-05-20 NOTE — Telephone Encounter (Signed)
 Copied from CRM [id number]. Topic: Clinical - Red Word Triage >> May 20, 2024  3:49 PM Lotus Round B wrote: [facility] that prompted transfer to Nurse Triage: dizzy and falling over  Chief Complaint: dizziness and falling over Symptoms: see above Frequency: ongoing was seen in ER yesterday Pertinent Negatives: Patient denies fever, cp, sob Disposition: [x] ED /[] Urgent Care (no appt availability in office) / [] Appointment(In office/virtual)/ []  [facility]/ [] Home Care/ [] Refused Recommended Disposition /[] [facility]/ []  Follow-up with PCP Additional Notes: instructed to go to ER; this RN called 911.  EMS is in route.   Reason for Disposition  SEVERE dizziness (e.g., unable to stand, requires support to walk, feels like passing out now)  Answer Assessment - Initial Assessment Questions 1. DESCRIPTION: "Describe your dizziness."     States not getting better 2. LIGHTHEADED: "Do you feel lightheaded?" (e.g., somewhat faint, woozy, weak upon standing)     Dizzy, staggering 3. VERTIGO: "Do you feel like either you or the room is spinning or tilting?" (i.e. vertigo)     Neither, states she she stands up she wants to walk one way and can't control it 4. SEVERITY: "How bad is it?"  "Do you feel like you are going to faint?" "Can you stand and walk?"   - MILD: Feels slightly dizzy, but walking normally.   - MODERATE: Feels unsteady when walking, but not falling; interferes with normal activities (e.g., school, work).   - SEVERE: Unable to walk without falling, or requires assistance to walk without falling; feels like passing out now.      Walks and bounces into door ways and walls 5. ONSET:  "When did the dizziness begin?"     Has been on going 6. AGGRAVATING FACTORS: "Does anything make it worse?" (e.g., standing, change in head position)     walking 7. HEART RATE: "Can you tell me your heart rate?" "How many beats in 15 seconds?"  (Note: not all patients can do this)        States yes, gets worked up 8. CAUSE: "What do you think is causing the dizziness?"     na 9. RECURRENT SYMPTOM: "Have you had dizziness before?" If Yes, ask: "When was the last time?" "What happened that time?"     Yes, was in ER yesterday; bp was high 10. OTHER SYMPTOMS: "Do you have any other symptoms?" (e.g., fever, chest pain, vomiting, diarrhea, bleeding)       Nausea,  11. PREGNANCY: "Is there any chance you are pregnant?" "When was your last menstrual period?"       na  Protocols used: Dizziness - Lightheadedness-A-AH

## 2024-05-20 NOTE — ED Notes (Signed)
 Pt able to ambulate without assistance around room. She reports feeling "shaky" and says "I would be scared to walk alone".

## 2024-05-20 NOTE — ED Triage Notes (Signed)
 Arrived by [facility] from home with c/o dizziness X2 days. A&O x4 per EMS  Seen at [facility] on Monday and advised to come to ER. Seen at ER on Monday and discharged home with new blood pressure medication  EMS vitals: 168/100 b/p 92HR 125CBG 98oral  20G right forearm

## 2024-06-01 ENCOUNTER — Other Ambulatory Visit: Payer: Self-pay | Admitting: Family Medicine

## 2024-06-01 NOTE — Telephone Encounter (Unsigned)
 Copied from CRM [id number]. Topic: Clinical - Medication Refill >> Jun 01, 2024  8:51 AM [place] M wrote: Medication: meclizine  (ANTIVERT ) 25 MG tablet  Has the patient contacted their pharmacy? Yes (Agent: If no, request that the patient contact the pharmacy for the refill. If patient does not wish to contact the pharmacy document the reason why and proceed with request.) (Agent: If yes, when and what did the pharmacy advise?)  This is the patient's preferred pharmacy:  [facility] - [place], [place] - [address] [place] [address] Phone: [phone] Fax: [phone]  Is this the correct pharmacy for this prescription? Yes If no, delete pharmacy and type the correct one.   Has the prescription been filled recently? Yes  Is the patient out of the medication? Yes  Has the patient been seen for an appointment in the last year OR does the patient have an upcoming appointment? Yes  Can we respond through MyChart? Yes  Agent: Please be advised that Rx refills may take up to 3 business days. We ask that you follow-up with your pharmacy.

## 2024-06-02 MED ORDER — MECLIZINE HCL 25 MG PO TABS: 25.0000 mg | ORAL_TABLET | Freq: Three times a day (TID) | ORAL | 2 refills | Status: AC | PRN

## 2024-06-02 NOTE — Telephone Encounter (Signed)
 Requested medications are due for refill today.  yes  Requested medications are on the active medications list.  yes  Last refill. 05/19/2024 #30 0 rf  Future visit scheduled.   yes  Notes to clinic.  Refill not delegated. No pcp listed.    Requested Prescriptions  Pending Prescriptions Disp Refills   meclizine  (ANTIVERT ) 25 MG tablet 30 tablet 0    Sig: Take 1 tablet (25 mg total) by mouth 3 (three) times daily as needed for dizziness.     Not Delegated - Gastroenterology: Antiemetics Failed - 06/02/2024 10:38 AM      Failed - This refill cannot be delegated      Passed - Valid encounter within last 6 months    Recent Outpatient Visits           2 weeks ago Vertigo   [facility] Lamon Pillow, MD   1 month ago Left ankle swelling   [facility] [place], Judyann Number, MD

## 2024-06-03 ENCOUNTER — Ambulatory Visit: Admitting: Family Medicine

## 2024-06-08 ENCOUNTER — Encounter: Payer: Self-pay | Admitting: Family Medicine

## 2024-06-08 ENCOUNTER — Ambulatory Visit: Admitting: Family Medicine

## 2024-06-08 VITALS — BP 153/74 | HR 80 | Resp 14 | Wt 132.8 lb

## 2024-06-08 DIAGNOSIS — I10 Essential (primary) hypertension: Secondary | ICD-10-CM | POA: Insufficient documentation

## 2024-06-08 DIAGNOSIS — R42 Dizziness and giddiness: Secondary | ICD-10-CM | POA: Diagnosis not present

## 2024-06-08 DIAGNOSIS — M150 Primary generalized (osteo)arthritis: Secondary | ICD-10-CM | POA: Diagnosis not present

## 2024-06-08 MED ORDER — AMLODIPINE BESYLATE 2.5 MG PO TABS: 2.5000 mg | ORAL_TABLET | Freq: Every day | ORAL | 1 refills | Status: DC

## 2024-06-08 NOTE — Patient Instructions (Signed)
 Jacqueline Daniels  Please review the attached list of medications and notify my office if there are any errors.   . Please bring all of your medications to every appointment so we can make sure that our medication list is the same as yours.

## 2024-06-08 NOTE — Progress Notes (Signed)
 Established patient visit   Patient: Jacqueline Daniels   DOB: 20-Apr-1936   88 y.o. Female  MRN: [id number] Visit Date: 06/08/2024  Today's healthcare provider: Jeralene Mom, MD   Chief Complaint  Patient presents with   Follow-up   Subjective    Discussed the use of AI scribe software for clinical note transcription with the patient, who gave verbal consent to proceed.  History of Present Illness   Jacqueline Daniels is an 88 year old female with hypertension who presents for follow-up of dizziness and blood pressure management. She was seen here on 5/27 for dizziness and high blood pressure at which time she was started on amlodipine  2.5mg . She also had ER visits prior to and after that time. Neuroimaging has been normal and she was advised to follow up with ENT at her last ER visit.   She experiences ongoing dizziness, particularly when standing up quickly, which she suspects may be related to her inner ears feeling 'stuffed up.' She avoids quick movements to manage the dizziness and finds prescribed medication for dizziness helpful. She recalls a previous episode of severe dizziness that led to a visit to the ER, where she felt like her equilibrium was off and was 'bouncing off of everything.'  She experiences occasional lightheadedness and a sensation of losing balance. She is cautious when bending over and rising to prevent dizziness. She monitors her blood pressure at home, which typically reads around 130/70-80 mmHg.  She notes that her blood pressure was previously higher, around 140 mmHg, but has improved with medication.  She experiences chronic pain, particularly in her ankle, which she attributes to arthritis. She uses hydrocodone  for pain relief, especially after activities like gardening or vacuuming, which exacerbate her back pain. 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  She mentions having hearing aids and notes that her ears are frequently 'stuffed up,' which she attempts to alleviate without success. No recent changes in hearing despite ear fullness.     BP Readings from Last 6 Encounters:  06/08/24 (!) 153/74  05/20/24 (!) 164/91  05/19/24 (!) 147/77  05/18/24 (!) 185/89  05/18/24 (!) 181/103  04/30/24 139/76     Medications: Outpatient Medications Prior to Visit  Medication Sig   ALPRAZolam  (XANAX ) 0.25 MG tablet TAKE 1/2-1 TABLET BY MOUTH EVERY DAY AS NEEDED FOR ANXIETY (Patient taking differently: as needed for anxiety.)   baclofen  (LIORESAL ) 10 MG tablet TAKE 1 TABLET BY MOUTH THREE TIMES DAILYAS NEEDED FOR MUSCLE SPASMS   gabapentin  (NEURONTIN ) 100 MG capsule TAKE 1 CAPSULE BY MOUTH AT BEDTIME   HYDROcodone -acetaminophen  (NORCO/VICODIN) 5-325 MG tablet Take 1 tablet by mouth every 6 (six) hours as needed for moderate pain (pain score 4-6).   meclizine  (ANTIVERT ) 25 MG tablet Take 1 tablet (25 mg total) by mouth 3 (three) times daily as needed for dizziness.   Multiple Vitamins-Minerals (MULTIVITAMIN WITH MINERALS) tablet Take 1 tablet by mouth daily.   [DISCONTINUED] amLODipine  (NORVASC ) 2.5 MG tablet Take 1 tablet (2.5 mg total) by mouth daily.   No facility-administered medications prior to visit.   Review of Systems     Objective    BP (!) 153/74 (BP Location: Right Arm, Patient Position: Sitting, Cuff Size: Normal)   Pulse 80   Resp 14   Wt 132 lb 12.8 oz (60.2 kg)   SpO2 99%   BMI  23.52 kg/m   Physical Exam   General: Appearance:    Well developed, well nourished female in no acute distress  Eyes:    PERRL, conjunctiva/corneas clear, EOM's intact       Lungs:     Clear to auscultation bilaterally, respirations unlabored  Heart:    Normal heart rate. Normal rhythm. No murmurs, rubs, or gallops.    MS:   All extremities are intact.    Neurologic:   Awake, alert, oriented x 3. No apparent focal neurological defect.          Assessment & Plan       Dizziness Dizziness likely related to inner ear issues or blood pressure fluctuations. Managed with medication. - Is to follow up with otolaryngologist for inner ear evaluation. - Continue current medication for dizziness as needed.  Hypertension Hypertension improved on amlodipine  2.5 mg. Office readings higher than home.  - Continue amlodipine  2.5 mg daily. - Prescribe 90-day supply of amlodipine . - Schedule follow-up in three months to reassess blood pressure.  Arthritis Ankle pain due to arthritis, relieved by hydrocodone . - Continue hydrocodone  for pain management as needed.    Return in about 3 months (around 09/08/2024).     Jeralene Mom, MD  [facility] [phone] (phone) [phone] (fax)  [facility]

## 2024-06-17 ENCOUNTER — Other Ambulatory Visit: Payer: Self-pay | Admitting: Family Medicine

## 2024-07-13 ENCOUNTER — Other Ambulatory Visit: Payer: Self-pay | Admitting: Family Medicine

## 2024-08-13 ENCOUNTER — Other Ambulatory Visit: Payer: Self-pay | Admitting: Family Medicine

## 2024-08-27 ENCOUNTER — Telehealth: Payer: Self-pay

## 2024-08-27 NOTE — Telephone Encounter (Signed)
 Copied from CRM #[id number]. Topic: Clinical - Prescription Issue >> Aug 27, 2024  2:28 PM Emylou G wrote: Reason for CRM: Sander gower w/[employer] called.. had concerns about ALPRAZolam  (XANAX ) 0.25 MG tablet AND HYDROcodone -acetaminophen  (NORCO/VICODIN) 5-325 MG tablet side effects w/ combination being taken together.. is this a one time thing or recurring .SABRA Can their be safer combination?  [phone] can call and speak to any pharmacist

## 2024-08-27 NOTE — Telephone Encounter (Signed)
 Rarely takes the very dose alprazolam  and does not combine with hydrocodone /apap

## 2024-09-09 ENCOUNTER — Other Ambulatory Visit: Payer: Self-pay | Admitting: Family Medicine

## 2024-09-11 ENCOUNTER — Encounter: Payer: Self-pay | Admitting: Family Medicine

## 2024-09-11 ENCOUNTER — Ambulatory Visit (INDEPENDENT_AMBULATORY_CARE_PROVIDER_SITE_OTHER): Admitting: Family Medicine

## 2024-09-11 VITALS — BP 172/83 | HR 85 | Ht 63.5 in | Wt 136.0 lb

## 2024-09-11 DIAGNOSIS — F418 Other specified anxiety disorders: Secondary | ICD-10-CM | POA: Diagnosis not present

## 2024-09-11 DIAGNOSIS — Z23 Encounter for immunization: Secondary | ICD-10-CM

## 2024-09-11 DIAGNOSIS — I10 Essential (primary) hypertension: Secondary | ICD-10-CM

## 2024-09-11 MED ORDER — VALSARTAN 40 MG PO TABS: 40.0000 mg | ORAL_TABLET | Freq: Every day | ORAL | 0 refills | Status: DC

## 2024-09-11 NOTE — Patient Instructions (Signed)
 Jacqueline Daniels  Please review the attached list of medications and notify my office if there are any errors.   . Please bring all of your medications to every appointment so we can make sure that our medication list is the same as yours.

## 2024-09-11 NOTE — Progress Notes (Signed)
 Established patient visit   Patient: Jacqueline Daniels   DOB: March 28, 1936   88 y.o. Female  MRN: [id number] Visit Date: 09/11/2024  Today's healthcare provider: Nancyann Perry, MD   Chief Complaint  Patient presents with   Medical Management of Chronic Issues   Hypertension   Subjective    Discussed the use of AI scribe software for clinical note transcription with the patient, who gave verbal consent to proceed.  History of Present Illness   Jacqueline Daniels is an 88 year old female with hypertension who presents for follow-up since starting amlodipine .  She started taking amlodipine  2.5 mg daily in August. Her home blood pressure readings range from 139 to 150 mmHg. She recalls a previous incident of significantly elevated blood pressure, leading to a visit to a walk-in clinic where she was advised to seek emergency care due to the risk of stroke. She takes her blood pressure medication every morning.  She experiences anxiety, describing it as feeling 'dumb' and getting 'worked up' about situations such as coming to appointments. She uses alprazolam  as needed for anxiety, particularly before stressful events, but not on a daily basis.  She has a swollen ankle that causes pain, especially at night. She uses pain medication as needed, particularly after activities like gardening that exacerbate her back pain.  Her social history includes engaging in gardening, which she enjoys but notes it can cause back pain due to bending over.       Medications: Outpatient Medications Prior to Visit  Medication Sig   ALPRAZolam  (XANAX ) 0.25 MG tablet TAKE 1/2-1 TABLET BY MOUTH EVERY DAY AS NEEDED FOR ANXIETY (Patient taking differently: as needed for anxiety.)   amLODipine  (NORVASC ) 2.5 MG tablet Take 1 tablet (2.5 mg total) by mouth daily.   baclofen  (LIORESAL ) 10 MG tablet TAKE 1 TABLET BY MOUTH THREE TIMES DAILYAS NEEDED FOR MUSCLE SPASMS   gabapentin  (NEURONTIN ) 100 MG capsule TAKE 1  CAPSULE BY MOUTH AT BEDTIME   HYDROcodone -acetaminophen  (NORCO/VICODIN) 5-325 MG tablet Take 1 tablet by mouth every 6 (six) hours as needed for moderate pain (pain score 4-6).   meclizine  (ANTIVERT ) 25 MG tablet Take 1 tablet (25 mg total) by mouth 3 (three) times daily as needed for dizziness.   Multiple Vitamins-Minerals (MULTIVITAMIN WITH MINERALS) tablet Take 1 tablet by mouth daily.   No facility-administered medications prior to visit.   Review of Systems  Constitutional:  Negative for appetite change, chills, fatigue and fever.  Respiratory:  Negative for chest tightness and shortness of breath.   Cardiovascular:  Negative for chest pain and palpitations.  Gastrointestinal:  Negative for abdominal pain, nausea and vomiting.  Neurological:  Negative for dizziness and weakness.       Objective    BP (!) 172/83 (BP Location: Right Arm, Patient Position: Sitting, Cuff Size: Normal)   Pulse 85   Ht 5' 3.5 (1.613 m)   Wt 136 lb (61.7 kg)   SpO2 98%   BMI 23.71 kg/m   Physical Exam   General appearance: Well developed, well nourished female, cooperative and in no acute distress Head: Normocephalic, without obvious abnormality, atraumatic Respiratory: Respirations even and unlabored, normal respiratory rate Extremities: All extremities are intact.  Skin: Skin color, texture, turgor normal. No rashes seen  Psych: Appropriate mood and affect. Neurologic: Mental status: Alert, oriented to person, place, and time, thought content appropriate.    Assessment & Plan    1. Primary hypertension (Primary) Tolerating initiation of amlodipine   with no apparent side effects. In-office BP still quite high, home Bps better, but not to goal.   Will ADD - valsartan  (DIOVAN ) 40 MG tablet; Take 1 tablet (40 mg total) by mouth daily. Take along WITH amlodipine  every morning  Dispense: 90 tablet; Refill: 0  2. Situational anxiety Does well with occasional low dose alprazolam . Reinforced  importance that it NOT be taking when she has taken hydrocodone /apap   3. Immunization due  - Flu vaccine HIGH DOSE PF(Fluzone Trivalent)   Return in about 6 weeks (around 10/23/2024) for Hypertension.     Nancyann Perry, MD  [facility] [phone] (phone) [phone] (fax)  [facility]

## 2024-09-28 ENCOUNTER — Other Ambulatory Visit: Payer: Self-pay | Admitting: Family Medicine

## 2024-09-28 DIAGNOSIS — M48062 Spinal stenosis, lumbar region with neurogenic claudication: Secondary | ICD-10-CM

## 2024-10-07 ENCOUNTER — Telehealth: Payer: Self-pay

## 2024-10-07 NOTE — Telephone Encounter (Signed)
 Copied from CRM [id number]. Topic: Clinical - Prescription Issue >> Oct 07, 2024  3:19 PM Ashley R wrote:  Reason for CRM: Pharmacist calling from [employer] requesting provider to please review all current medications on next visit. concurrent use of opiods and benzodiazepines could have possible side effects. In reference to:   HYDROcodone -acetaminophen  (NORCO/VICODIN) 5-325 MG tablet ALPRAZolam  (XANAX ) 0.25 MG tablet

## 2024-10-10 ENCOUNTER — Other Ambulatory Visit: Payer: Self-pay | Admitting: Family Medicine

## 2024-11-02 ENCOUNTER — Encounter: Payer: Self-pay | Admitting: Family Medicine

## 2024-11-02 ENCOUNTER — Ambulatory Visit: Admitting: Family Medicine

## 2024-11-02 VITALS — BP 154/84 | HR 85 | Resp 16 | Wt 137.5 lb

## 2024-11-02 DIAGNOSIS — F418 Other specified anxiety disorders: Secondary | ICD-10-CM

## 2024-11-02 DIAGNOSIS — R413 Other amnesia: Secondary | ICD-10-CM | POA: Diagnosis not present

## 2024-11-02 DIAGNOSIS — M19072 Primary osteoarthritis, left ankle and foot: Secondary | ICD-10-CM | POA: Diagnosis not present

## 2024-11-02 NOTE — Progress Notes (Signed)
 Established patient visit   Patient: Jacqueline Daniels   DOB: [birth year]   88 y.o. Female  MRN: [id number] Visit Date: 11/02/2024  Today's healthcare provider: Nancyann Perry, MD   Chief Complaint  Patient presents with   Joint Swelling    Left ankle swelling, pain. No injury    Subjective    Discussed the use of AI scribe software for clinical note transcription with the patient, who gave verbal consent to proceed.  History of Present Illness   Jacqueline Daniels is an 88 year old female with arthritis who presents with right ankle pain and swelling.  She has experienced swelling and pain in her right ankle for the past couple of months. The pain is intermittent, worsening during her morning walks and at night when lying in bed. It affects her ability to walk normally, causing her to hobble. She has not tried ankle wraps or braces yet.  She recalls being told she has arthritis in her ankle and that x-rays were performed. She is seeking options for pain relief, including the possibility of a cortisone shot, but has not noticed any improvement in her symptoms over the past two months.  She reports significant issues with memory, describing it as 'horrible' and causing anxiety, especially when trying to find familiar locations. The anxiety resolves once she starts driving. She uses anxiety medication as needed but is concerned about memory lapses, such as forgetting if she has fed a raccoon she regularly feeds.       Medications: Outpatient Medications Prior to Visit  Medication Sig   ALPRAZolam  (XANAX ) 0.25 MG tablet TAKE 1/2-1 TABLET BY MOUTH ONCE DAILY ASNEEDED FOR ANXIETY   amLODipine  (NORVASC ) 2.5 MG tablet TAKE 1 TABLET BY MOUTH DAILY   baclofen  (LIORESAL ) 10 MG tablet TAKE 1 TABLET BY MOUTH THREE TIMES DAILYAS NEEDED FOR MUSCLE SPASMS   gabapentin  (NEURONTIN ) 100 MG capsule TAKE 1 CAPSULE BY MOUTH AT BEDTIME   HYDROcodone -acetaminophen  (NORCO/VICODIN) 5-325 MG tablet Take 1  tablet by mouth every 6 (six) hours as needed for moderate pain (pain score 4-6).   meclizine  (ANTIVERT ) 25 MG tablet Take 1 tablet (25 mg total) by mouth 3 (three) times daily as needed for dizziness.   Multiple Vitamins-Minerals (MULTIVITAMIN WITH MINERALS) tablet Take 1 tablet by mouth daily.   valsartan  (DIOVAN ) 40 MG tablet Take 1 tablet (40 mg total) by mouth daily. Take along WITH amlodipine  every morning   No facility-administered medications prior to visit.   Review of Systems  Constitutional:  Negative for appetite change, chills, fatigue and fever.  Respiratory:  Negative for chest tightness and shortness of breath.   Cardiovascular:  Negative for chest pain and palpitations.  Gastrointestinal:  Negative for abdominal pain, nausea and vomiting.  Neurological:  Negative for dizziness and weakness.       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 Differential includes tendon or ligament injury, but less likely. Prefers non-surgical options. - Recommended ankle wraps for support during walks. - Referred  to orthopedist for evaluation and possible x-rays. - Discussed cortisone injections as a treatment option.  Memory loss Reports significant memory issues, possibly anxiety-related. No major deficits observed, but she is concerned about worsening memory. - Offered referral to neurologist for memory evaluation if desired.  Anxiety disorder Anxiety related to memory and navigation, especially in unfamiliar places. Currently using anxiety medication as needed, considering increased use. - Advised preemptive use of anxiety  medication for known anxiety-inducing situations. - Discussed alternative anxiety management strategies if daily use becomes necessary.          Nancyann Perry, MD  [facility] [phone] (phone) [phone] (fax)  [facility]

## 2024-11-25 ENCOUNTER — Other Ambulatory Visit: Payer: Self-pay | Admitting: Family Medicine

## 2024-12-25 ENCOUNTER — Other Ambulatory Visit: Payer: Self-pay | Admitting: Family Medicine

## 2024-12-29 ENCOUNTER — Other Ambulatory Visit: Payer: Self-pay | Admitting: Family Medicine

## 2024-12-29 DIAGNOSIS — I10 Essential (primary) hypertension: Secondary | ICD-10-CM
